# Patient Record
Sex: Female | Born: 1955 | Race: Black or African American | Hispanic: No | Marital: Married | Smoking: Never smoker
Health system: Southern US, Community
[De-identification: ages and names within clinical notes are randomized; demographics above are authoritative.]

## PROBLEM LIST (undated history)

## (undated) DIAGNOSIS — Z923 Personal history of irradiation: Secondary | ICD-10-CM

## (undated) DIAGNOSIS — D571 Sickle-cell disease without crisis: Secondary | ICD-10-CM

## (undated) DIAGNOSIS — R0600 Dyspnea, unspecified: Secondary | ICD-10-CM

## (undated) DIAGNOSIS — F411 Generalized anxiety disorder: Secondary | ICD-10-CM

## (undated) DIAGNOSIS — Z170 Estrogen receptor positive status [ER+]: Principal | ICD-10-CM

## (undated) DIAGNOSIS — I10 Essential (primary) hypertension: Secondary | ICD-10-CM

## (undated) DIAGNOSIS — C50412 Malignant neoplasm of upper-outer quadrant of left female breast: Principal | ICD-10-CM

## (undated) DIAGNOSIS — R7303 Prediabetes: Secondary | ICD-10-CM

## (undated) DIAGNOSIS — E785 Hyperlipidemia, unspecified: Secondary | ICD-10-CM

## (undated) DIAGNOSIS — E669 Obesity, unspecified: Secondary | ICD-10-CM

## (undated) DIAGNOSIS — M1990 Unspecified osteoarthritis, unspecified site: Secondary | ICD-10-CM

## (undated) DIAGNOSIS — Z9221 Personal history of antineoplastic chemotherapy: Secondary | ICD-10-CM

## (undated) DIAGNOSIS — R0609 Other forms of dyspnea: Secondary | ICD-10-CM

## (undated) DIAGNOSIS — G709 Myoneural disorder, unspecified: Secondary | ICD-10-CM

## (undated) HISTORY — DX: Sickle-cell disease without crisis: D57.1

## (undated) HISTORY — DX: Hyperlipidemia, unspecified: E78.5

## (undated) HISTORY — DX: Obesity, unspecified: E66.9

## (undated) HISTORY — DX: Other forms of dyspnea: R06.09

## (undated) HISTORY — DX: Estrogen receptor positive status (ER+): Z17.0

## (undated) HISTORY — DX: Essential (primary) hypertension: I10

## (undated) HISTORY — DX: Malignant neoplasm of upper-outer quadrant of left female breast: C50.412

## (undated) HISTORY — DX: Dyspnea, unspecified: R06.00

## (undated) HISTORY — PX: TUBAL LIGATION: SHX77

## (undated) HISTORY — DX: Generalized anxiety disorder: F41.1

## (undated) HISTORY — DX: Myoneural disorder, unspecified: G70.9

---

## 1998-04-29 ENCOUNTER — Other Ambulatory Visit: Admission: RE | Admit: 1998-04-29 | Discharge: 1998-04-29 | Payer: Self-pay

## 2004-12-16 ENCOUNTER — Ambulatory Visit: Payer: Self-pay | Admitting: Family Medicine

## 2005-06-21 ENCOUNTER — Ambulatory Visit: Payer: Self-pay | Admitting: Family Medicine

## 2005-07-12 ENCOUNTER — Ambulatory Visit: Payer: Self-pay | Admitting: Family Medicine

## 2006-01-06 ENCOUNTER — Encounter: Admission: RE | Admit: 2006-01-06 | Discharge: 2006-01-06 | Payer: Self-pay | Admitting: *Deleted

## 2006-01-20 ENCOUNTER — Encounter: Admission: RE | Admit: 2006-01-20 | Discharge: 2006-01-20 | Payer: Self-pay | Admitting: *Deleted

## 2006-05-31 ENCOUNTER — Telehealth: Payer: Self-pay | Admitting: *Deleted

## 2006-05-31 ENCOUNTER — Ambulatory Visit: Payer: Self-pay | Admitting: Sports Medicine

## 2006-06-01 ENCOUNTER — Ambulatory Visit: Payer: Self-pay | Admitting: Family Medicine

## 2006-06-01 ENCOUNTER — Encounter (INDEPENDENT_AMBULATORY_CARE_PROVIDER_SITE_OTHER): Payer: Self-pay | Admitting: Family Medicine

## 2006-06-01 ENCOUNTER — Ambulatory Visit (HOSPITAL_COMMUNITY): Admission: RE | Admit: 2006-06-01 | Discharge: 2006-06-01 | Payer: Self-pay | Admitting: Family Medicine

## 2006-06-01 DIAGNOSIS — I10 Essential (primary) hypertension: Secondary | ICD-10-CM | POA: Insufficient documentation

## 2006-06-01 LAB — CONVERTED CEMR LAB
Blood in Urine, dipstick: NEGATIVE
CO2: 24 meq/L (ref 19–32)
Chloride: 99 meq/L (ref 96–112)
Glucose, Urine, Semiquant: NEGATIVE
Ketones, urine, test strip: NEGATIVE
Potassium: 4.3 meq/L (ref 3.5–5.3)
Protein, U semiquant: NEGATIVE
Sodium: 142 meq/L (ref 135–145)
Specific Gravity, Urine: 1.01
pH: 6.5

## 2006-06-02 ENCOUNTER — Telehealth: Payer: Self-pay | Admitting: *Deleted

## 2006-06-06 ENCOUNTER — Encounter (INDEPENDENT_AMBULATORY_CARE_PROVIDER_SITE_OTHER): Payer: Self-pay | Admitting: Family Medicine

## 2006-06-29 ENCOUNTER — Encounter: Admission: RE | Admit: 2006-06-29 | Discharge: 2006-06-29 | Payer: Self-pay | Admitting: General Practice

## 2006-08-08 ENCOUNTER — Encounter (INDEPENDENT_AMBULATORY_CARE_PROVIDER_SITE_OTHER): Payer: Self-pay | Admitting: Family Medicine

## 2006-08-08 ENCOUNTER — Encounter: Admission: RE | Admit: 2006-08-08 | Discharge: 2006-08-08 | Payer: Self-pay | Admitting: Sports Medicine

## 2006-09-20 ENCOUNTER — Telehealth (INDEPENDENT_AMBULATORY_CARE_PROVIDER_SITE_OTHER): Payer: Self-pay | Admitting: Family Medicine

## 2007-02-15 ENCOUNTER — Ambulatory Visit: Payer: Self-pay | Admitting: Family Medicine

## 2007-03-01 ENCOUNTER — Encounter: Admission: RE | Admit: 2007-03-01 | Discharge: 2007-03-01 | Payer: Self-pay | Admitting: Family Medicine

## 2007-03-13 ENCOUNTER — Telehealth (INDEPENDENT_AMBULATORY_CARE_PROVIDER_SITE_OTHER): Payer: Self-pay | Admitting: Family Medicine

## 2007-03-19 ENCOUNTER — Telehealth (INDEPENDENT_AMBULATORY_CARE_PROVIDER_SITE_OTHER): Payer: Self-pay | Admitting: Family Medicine

## 2007-04-17 ENCOUNTER — Telehealth (INDEPENDENT_AMBULATORY_CARE_PROVIDER_SITE_OTHER): Payer: Self-pay | Admitting: Family Medicine

## 2007-04-25 ENCOUNTER — Ambulatory Visit: Payer: Self-pay | Admitting: Family Medicine

## 2007-04-25 ENCOUNTER — Encounter (INDEPENDENT_AMBULATORY_CARE_PROVIDER_SITE_OTHER): Payer: Self-pay | Admitting: Family Medicine

## 2007-04-25 LAB — CONVERTED CEMR LAB
BUN: 18 mg/dL (ref 6–23)
CO2: 28 meq/L (ref 19–32)
Chloride: 97 meq/L (ref 96–112)
Glucose, Bld: 90 mg/dL (ref 70–99)
Potassium: 3.6 meq/L (ref 3.5–5.3)
Sodium: 137 meq/L (ref 135–145)

## 2007-05-15 ENCOUNTER — Telehealth (INDEPENDENT_AMBULATORY_CARE_PROVIDER_SITE_OTHER): Payer: Self-pay | Admitting: Family Medicine

## 2007-10-22 ENCOUNTER — Telehealth: Payer: Self-pay | Admitting: *Deleted

## 2007-12-07 ENCOUNTER — Telehealth (INDEPENDENT_AMBULATORY_CARE_PROVIDER_SITE_OTHER): Payer: Self-pay | Admitting: *Deleted

## 2008-02-21 ENCOUNTER — Ambulatory Visit: Payer: Self-pay | Admitting: Family Medicine

## 2008-02-25 ENCOUNTER — Encounter: Payer: Self-pay | Admitting: Family Medicine

## 2008-02-25 ENCOUNTER — Ambulatory Visit: Payer: Self-pay | Admitting: Family Medicine

## 2008-02-27 ENCOUNTER — Encounter: Payer: Self-pay | Admitting: Family Medicine

## 2008-02-27 ENCOUNTER — Ambulatory Visit: Payer: Self-pay | Admitting: Family Medicine

## 2008-02-27 LAB — CONVERTED CEMR LAB
Cholesterol: 223 mg/dL — ABNORMAL HIGH (ref 0–200)
LDL Cholesterol: 146 mg/dL — ABNORMAL HIGH (ref 0–99)
Total CHOL/HDL Ratio: 4.3
VLDL: 25 mg/dL (ref 0–40)

## 2009-02-10 ENCOUNTER — Encounter: Payer: Self-pay | Admitting: Family Medicine

## 2009-02-10 ENCOUNTER — Ambulatory Visit: Payer: Self-pay | Admitting: Family Medicine

## 2009-02-10 DIAGNOSIS — E669 Obesity, unspecified: Secondary | ICD-10-CM | POA: Insufficient documentation

## 2009-02-10 DIAGNOSIS — E785 Hyperlipidemia, unspecified: Secondary | ICD-10-CM | POA: Insufficient documentation

## 2009-02-10 DIAGNOSIS — F411 Generalized anxiety disorder: Secondary | ICD-10-CM | POA: Insufficient documentation

## 2009-02-11 ENCOUNTER — Telehealth: Payer: Self-pay | Admitting: Family Medicine

## 2009-02-11 LAB — CONVERTED CEMR LAB
ALT: 14 units/L (ref 0–35)
AST: 16 units/L (ref 0–37)
Albumin: 4.3 g/dL (ref 3.5–5.2)
Alkaline Phosphatase: 62 units/L (ref 39–117)
BUN: 12 mg/dL (ref 6–23)
HDL: 47 mg/dL (ref >39)
LDL Cholesterol: 159 mg/dL — ABNORMAL HIGH (ref 0–99)
Potassium: 4 meq/L (ref 3.5–5.3)
Sodium: 143 meq/L (ref 135–145)

## 2009-03-24 ENCOUNTER — Telehealth: Payer: Self-pay | Admitting: Family Medicine

## 2009-04-03 ENCOUNTER — Telehealth: Payer: Self-pay | Admitting: Family Medicine

## 2009-04-08 ENCOUNTER — Ambulatory Visit: Payer: Self-pay | Admitting: Family Medicine

## 2009-04-08 DIAGNOSIS — S0003XA Contusion of scalp, initial encounter: Secondary | ICD-10-CM | POA: Insufficient documentation

## 2009-04-08 DIAGNOSIS — S1093XA Contusion of unspecified part of neck, initial encounter: Secondary | ICD-10-CM

## 2009-04-08 DIAGNOSIS — S0083XA Contusion of other part of head, initial encounter: Secondary | ICD-10-CM

## 2009-05-26 ENCOUNTER — Ambulatory Visit: Payer: Self-pay | Admitting: Family Medicine

## 2009-05-26 ENCOUNTER — Encounter: Payer: Self-pay | Admitting: Family Medicine

## 2009-05-26 DIAGNOSIS — R42 Dizziness and giddiness: Secondary | ICD-10-CM

## 2009-05-26 DIAGNOSIS — R000 Tachycardia, unspecified: Secondary | ICD-10-CM | POA: Insufficient documentation

## 2009-05-27 ENCOUNTER — Ambulatory Visit (HOSPITAL_COMMUNITY): Admission: RE | Admit: 2009-05-27 | Discharge: 2009-05-27 | Payer: Self-pay | Admitting: Family Medicine

## 2009-05-27 ENCOUNTER — Telehealth: Payer: Self-pay | Admitting: Family Medicine

## 2009-05-27 LAB — CONVERTED CEMR LAB: Direct LDL: 88 mg/dL

## 2009-05-29 ENCOUNTER — Encounter: Payer: Self-pay | Admitting: Family Medicine

## 2009-05-29 LAB — CONVERTED CEMR LAB: Pap Smear: NEGATIVE

## 2009-06-29 ENCOUNTER — Telehealth (INDEPENDENT_AMBULATORY_CARE_PROVIDER_SITE_OTHER): Payer: Self-pay | Admitting: *Deleted

## 2009-07-17 ENCOUNTER — Encounter: Payer: Self-pay | Admitting: Family Medicine

## 2009-07-27 ENCOUNTER — Telehealth: Payer: Self-pay | Admitting: Family Medicine

## 2010-02-11 ENCOUNTER — Telehealth (INDEPENDENT_AMBULATORY_CARE_PROVIDER_SITE_OTHER): Payer: Self-pay | Admitting: *Deleted

## 2010-03-02 ENCOUNTER — Ambulatory Visit: Payer: Self-pay | Admitting: Family Medicine

## 2010-03-02 ENCOUNTER — Encounter: Payer: Self-pay | Admitting: Family Medicine

## 2010-03-02 LAB — CONVERTED CEMR LAB
BUN: 12 mg/dL (ref 6–23)
CO2: 27 meq/L (ref 19–32)
Calcium: 9.3 mg/dL (ref 8.4–10.5)
Chloride: 100 meq/L (ref 96–112)
Cholesterol: 164 mg/dL (ref 0–200)
Creatinine, Ser: 0.77 mg/dL (ref 0.40–1.20)
HDL: 47 mg/dL (ref >39)
LDL Goal: 130 mg/dL
Total CHOL/HDL Ratio: 3.5

## 2010-03-16 ENCOUNTER — Telehealth: Payer: Self-pay | Admitting: Family Medicine

## 2010-04-13 NOTE — Consult Note (Signed)
Summary: [facility]  [facility]   Imported By: Clydell Hakim 07/22/2009 11:20:34  _____________________________________________________________________  External Attachment:    Type:   Image     Comment:   External Document  Appended Document: [facility] Reviewed

## 2010-04-13 NOTE — Progress Notes (Signed)
Summary: Test Res  Phone Note Call from Patient Call back at Home Phone [phone]   Caller: Patient Summary of Call: Pt checking on status of CT that she had today? Initial call taken by: Clydell Hakim,  May 27, 2009 3:02 PM  Follow-up for Phone Call        will forward to MD. Follow-up by: Theresia Lo RN,  May 27, 2009 3:45 PM  Additional Follow-up for Phone Call Additional follow up Details #1::        I have attempted to call the patient multiple times at the number provided but no answer.  Will send copy of results. Additional Follow-up by: Marisue Ivan  MD,  May 29, 2009 9:17 AM

## 2010-04-13 NOTE — Progress Notes (Signed)
Summary: phn msg  Phone Note Call from Patient Call back at [facility] Phone [phone]   Caller: Patient Summary of Call: Pt fell last week and hit her head and wondering if she should she the doctor.  She wanted to talk to the Dr. not the nurse. Initial call taken by: Clydell Hakim,  April 03, 2009 2:20 PM  Follow-up for Phone Call        forwarded to PCP Follow-up by: Gladstone Pih,  April 06, 2009 9:44 AM

## 2010-04-13 NOTE — Progress Notes (Signed)
Summary: referral  Phone Note Call from Patient Call back at Home Phone [phone] Call back at [phone]   Caller: Patient Summary of Call: still having problems w/ dizzyness/ear/headache wants to be referral to ENT Initial call taken by: De Nurse,  June 29, 2009 2:48 PM  Follow-up for Phone Call        will forward message to MD. Follow-up by: Theresia Lo RN,  June 29, 2009 2:54 PM  Additional Follow-up for Phone Call Additional follow up Details #1::        pt was instructed to f/u in 2 weeks after office visit to reassess and start of meclizine.  will advise for office visit to reevaluate and determine if referral is deem medically necessary Additional Follow-up by: Marisue Ivan  MD,  June 29, 2009 10:03 PM    Additional Follow-up for Phone Call Additional follow up Details #2::    patient notified and appointment scheduled 07/02/2009 with Dr. Burnadette Pop. Follow-up by: Theresia Lo RN,  June 30, 2009 9:53 AM

## 2010-04-13 NOTE — Progress Notes (Signed)
Summary: triage  Phone Note Call from Patient Call back at Home Phone [phone]   Caller: Patient Summary of Call: Larey Seat yesterday and hit head. Feels o.k.  Dull ache.  Wondering if she should she someone. Initial call taken by: Clydell Hakim,  March 24, 2009 3:12 PM  Follow-up for Phone Call        hit head yesterday. slipped on wet floor in kitchen. denies sleepiness or confusion. has a "little, dull headache" pain is 2/10.  no LOC. denies lumps or bumps or confusion. able to speak normally & move all extremities.  feels fine. advised tylenol or ibu for the HA. offered appt. states she will take the tylenol or ibu & call tomorrow if she wants to be seen told her to go to ED if pain increased, vomiting, confusion, motor changes or otherwise not feeling right.  she agreed with plan Follow-up by: Golden Circle RN,  March 24, 2009 3:14 PM  Additional Follow-up for Phone Call Additional follow up Details #1::        agree with plan Additional Follow-up by: Marisue Ivan  MD,  March 24, 2009 5:14 PM

## 2010-04-13 NOTE — Assessment & Plan Note (Signed)
Summary: head contusion   Vital Signs:  Patient profile:   55 year old female Height:      63.5 inches Weight:      212.9 pounds BMI:     37.26 Temp:     99.1 degrees F Pulse rate:   103 / minute BP sitting:   153 / 91  (left arm) Cuff size:   regular  Vitals Entered By: Gladstone Pih (April 08, 2009 11:12 AM)  CC: Fell and hit head on Kitchen floor  55 years age Is Patient Diabetic? No Pain Assessment Patient in pain? yes     Location: head Intensity: 7 Type: sharp   Primary Care Provider:  Marisue Ivan  MD  CC:  Larey Seat and hit head on Kitchen floor  2 weeks age.  History of Present Illness: 55yo F here to f/u s/p head trauma  Head trauma: Fell on hardwood floors 2 weeks ago and hit her head.  At that time of the incident, she denies any LOC, bleeding, laceration, N/V, syncopal events, or vision changes.  Pain was a dull ache.  States that she continues to have pain on the top of her head.  No bump, change in mental status, alteration of vision, N/V, or difficulty concentrating.  No neck pain or back pain.  No focal weakness or numbness.  Habits & Providers  Alcohol-Tobacco-Diet     Tobacco Status: never  Allergies: No Known Drug Allergies  Review of Systems       No bump, change in mental status, alteration of vision, N/V, or difficulty concentrating.  No neck pain or back pain.  No focal weakness or numbness.  Physical Exam  General:  VS Reviewed. Well appearing, NAD.  Head:  atraumatic.   no laceration, bump, or bruise visible Eyes:  EOMI.  PERRLA.  Red Reflex- present and symmetric intensity  symmetric light reflex  Ears:  External ear exam shows no significant lesions or deformities.  Otoscopic examination reveals clear canals, tympanic membranes are intact bilaterally without bulging, retraction, inflammation or discharge. Hearing is grossly normal bilaterally. Nose:  External nasal examination shows no deformity or inflammation.  Mouth:  Oral  mucosa and oropharynx without lesions or exudates.  Teeth in good repair. Neck:  supple, full ROM, no goiter or mass  Lungs:  Normal respiratory effort, chest expands symmetrically. Lungs are clear to auscultation, no crackles or wheezes. Heart:  Normal rate and regular rhythm. S1 and S2 normal without gallop, murmur, click, rub or other extra sounds. Msk:  moves all ext without difficulty Neurologic:  sensation grossly intact alert & oriented X3, cranial nerves II-XII intact, strength normal in all extremities, gait normal, and DTRs symmetrical and normal.     Impression & Recommendations:  Problem # 1:  CONTUSION, HEAD (ICD-920) Assessment New Suspect deep contusion but no visible signs of neurological deficits.  I think a CT scan or MRI would be of little yield according to her exam and history.  I have instructed her to treat her acute pain with scheduled tylenol for the next week.  If she has any neurological deficits, she is to let me know right away.    Orders: [facility]- Est Level  3 ([address])  Complete Medication List: 1)  Lisinopril-hydrochlorothiazide 20-12.5 Mg Tabs (Lisinopril-hydrochlorothiazide) .... One tablet daily for blood pressure control 2)  Pravastatin Sodium 40 Mg Tabs (Pravastatin sodium) .... One by mouth at bedtime  Patient Instructions: 1)  Take the tylenol 2 extra strength (500mg ) every 8 hours  for the next week for the headache. 2)  If the pain is not improved in 1 week, call me and we'll reassess.

## 2010-04-13 NOTE — Assessment & Plan Note (Signed)
Summary: worsened dizziness and vertigo s/p fall- pap smear today   Vital Signs:  Patient profile:   55 year old female Height:      63.5 inches Weight:      210.6 pounds BMI:     36.85 Temp:     98.4 degrees F oral Pulse rate:   88 / minute BP sitting:   144 / 88  (left arm) Cuff size:   regular  Vitals Entered By: Gladstone Pih (May 26, 2009 8:49 AM)  Serial Vital Signs/Assessments:  Time      Position  BP       Pulse  Resp  Temp     By                     138/78                         Marisue Ivan  MD  Comments: Right arm, sitting, manual By: Marisue Ivan  MD   CC: worsening dizziness  Is Patient Diabetic? No Pain Assessment Patient in pain? no        Primary Care Provider:  Marisue Ivan  MD  CC:  worsening dizziness .  History of Present Illness: 55yo F c/o worsening dizziness and HA and here for pap smear  Dizziness: Reports unsteadiness and vertigo.  States that it was present prior to her fall but worsened after the fall.  She did not report this when I saw here on 04/08/2009.  Worsened by riding in a car and sometimes transitioning from sitting to standing.  Denies any vision changes,  N/V, or amnesia.  Still has pain on the top of her head.    HTN: No adverse effects from medication.  Not checking it regularly.  Was well controlled at last visit.  No  CP, palpitations, or swelling.   Preventative: Here for a pap smear today.  Habits & Providers  Alcohol-Tobacco-Diet     Tobacco Status: never  Current Medications (verified): 1)  Lisinopril-Hydrochlorothiazide 20-12.5 Mg  Tabs (Lisinopril-Hydrochlorothiazide) .... One Tablet Daily For Blood Pressure Control 2)  Pravastatin Sodium 40 Mg  Tabs (Pravastatin Sodium) .... One By Mouth At Bedtime  Allergies (verified): No Known Drug Allergies  Physical Exam  General:  VS Reviewed and rechecked. Obese, well appearing, NAD.  Head:  atraumatic.   no laceration, bump, or bruise  visible Eyes:  EOMI PERRLA vision grossly intact Mouth:  Oral mucosa and oropharynx without lesions or exudates.  Teeth in good repair. Neck:  supple, full ROM, no goiter or mass  Lungs:  Normal respiratory effort, chest expands symmetrically. Lungs are clear to auscultation, no crackles or wheezes. Heart:  Normal rate and regular rhythm. S1 and S2 normal without gallop, murmur, click, rub or other extra sounds. Genitalia:  Pelvic Exam:        External: normal female genitalia without lesions or masses        Vagina: normal without lesions or masses        Cervix: normal without lesions or masses        Adnexa: normal bimanual exam without masses or fullness        Uterus: normal by palpation        Pap smear: performed Extremities:  no edema Neurologic:  sensation grossly intact alert & oriented X3, cranial nerves II-XII intact, strength normal in all extremities, gait normal, and DTRs symmetrical and normal.  Impression & Recommendations:  Problem # 1:  DIZZINESS (ICD-780.4) Assessment Deteriorated Dizziness and vertigo- change from prior exam in Jan. Concerning b/c she states it is worsened since the fall. No neurological deficits on exam.  CBG wnls.  Discussed with Dr. Jennette Kettle.  Plan to obtain head CT to r/o bleed. Will treat her acute vertigo symptoms w/ Meclizine.   She is to f/u in 2 weeks.  Her updated medication list for this problem includes:    Meclizine Hcl 25 Mg Tabs (Meclizine hcl) .Marland Kitchen... 1 tab by mouth three times a day as needed for dizziness  Orders: Glucose Cap-[facility] (60454) CT without Contrast (CT w/o contrast) [facility]- Est  Level 4 (09811)  Problem # 2:  HYPERTENSION, BENIGN ESSENTIAL (ICD-401.1) Assessment: Improved Improved and now at goal (<140/90). No changes to regimen. I don't think BP is contributing to vertigo and dizziness.    Her updated medication list for this problem includes:    Lisinopril-hydrochlorothiazide 20-12.5 Mg Tabs  (Lisinopril-hydrochlorothiazide) ..... One tablet daily for blood pressure control  Orders: [facility]- Est  Level 4 ([address])  Problem # 3:  HYPERLIPIDEMIA (ICD-272.4) Assessment: Unchanged Recheck LDL after starting the pravastatin.  Her updated medication list for this problem includes:    Pravastatin Sodium 40 Mg Tabs (Pravastatin sodium) ..... One by mouth at bedtime  Problem # 4:  Preventive Health Care (ICD-V70.0) Assessment: Comment Only Pap smear obtained today.  Complete Medication List: 1)  Lisinopril-hydrochlorothiazide 20-12.5 Mg Tabs (Lisinopril-hydrochlorothiazide) .... One tablet daily for blood pressure control 2)  Pravastatin Sodium 40 Mg Tabs (Pravastatin sodium) .... One by mouth at bedtime 3)  Meclizine Hcl 25 Mg Tabs (Meclizine hcl) .Marland Kitchen.. 1 tab by mouth three times a day as needed for dizziness  Other Orders: Pap Smear-[facility] ([id number]) T-Lipoprotein (LDL cholesterol)  ([id number])  Patient Instructions: 1)  Please schedule a follow-up appointment in 2 weeks to reassess dizziness. 2)  I gave you a new medication to help with the dizziness. 3)  We are obtaining a head CT today to evaluate your symptoms. Prescriptions: MECLIZINE HCL 25 MG TABS (MECLIZINE HCL) 1 tab by mouth three times a day as needed for dizziness  #30 x 0   Entered and Authorized by:   Marisue Ivan  MD   Signed by:   Marisue Ivan  MD on 05/26/2009   Method used:   [facility]        [facility]  [address] [phone]* (retail)       [address]       [place], [place]  [id number]       Ph: [phone] or [phone]       Fax: [phone]   RxID:   [id number]    Prevention & Chronic Care Immunizations   Influenza vaccine: Not documented   Influenza vaccine deferral: Refused  (02/10/2009)    Tetanus booster: 02/10/2009: Tdap    Pneumococcal vaccine: Not documented  Colorectal Screening   Hemoccult: Not documented   Hemoccult  action/deferral: Refused  (05/26/2009)    Colonoscopy: Not documented   Colonoscopy action/deferral: Deferred  (02/10/2009)  Other Screening   Pap smear: normal  (07/27/2006)   Pap smear due: 07/2009    Mammogram: Normal  (03/06/2007)   Mammogram action/deferral: Ordered  (02/10/2009)   Mammogram due: 03/2008   Smoking status: never  (05/26/2009)  Lipids   Total Cholesterol: 227  (02/10/2009)   Lipid panel action/deferral: LDL Direct Ordered   LDL:  159  (02/10/2009)   LDL Direct: Not documented   HDL: 47  (02/10/2009)   Triglycerides: 107  (02/10/2009)    SGOT (AST): 16  (02/10/2009)   SGPT (ALT): 14  (02/10/2009)   Alkaline phosphatase: 62  (02/10/2009)   Total bilirubin: 0.4  (02/10/2009)    Lipid flowsheet reviewed?: Yes   Progress toward LDL goal: Unchanged  Hypertension   Last Blood Pressure: 144 / 88  (05/26/2009)   Serum creatinine: 0.81  (02/10/2009)   Serum potassium 4.0  (02/10/2009)    Hypertension flowsheet reviewed?: Yes   Progress toward BP goal: At goal  Self-Management Support :   Personal Goals (by the next clinic visit) :      Personal blood pressure goal: 140/90  (02/10/2009)     Personal LDL goal: 130  (02/10/2009)    Patient will work on the following items until the next clinic visit to reach self-care goals:     Medications and monitoring: take my medicines every day, check my blood pressure, bring all of my medications to every visit  (05/26/2009)     Eating: drink diet soda or water instead of juice or soda, eat more vegetables, use fresh or frozen vegetables, eat foods that are low in salt, eat baked foods instead of fried foods, eat fruit for snacks and desserts, limit or avoid alcohol  (05/26/2009)     Activity: take a 30 minute walk every day  (02/10/2009)    Hypertension self-management support: BP self-monitoring log, Written self-care plan, Education handout  (05/26/2009)   Hypertension self-care plan printed.   Hypertension  education handout printed    Lipid self-management support: Written self-care plan, Education handout  (02/10/2009)

## 2010-04-13 NOTE — Progress Notes (Signed)
Summary: Lisinopril-HCTZ and Pravastatin refill 6 month supply  Phone Note Refill Request Call back at Home Phone [phone] Call back at Work Phone [phone] Message from:  Patient  Refills Requested: Medication #1:  LISINOPRIL-HYDROCHLOROTHIAZIDE 20-12.5 MG  TABS one tablet daily for blood pressure control  Medication #2:  PRAVASTATIN SODIUM 40 MG  TABS one by mouth at bedtime [facility] [address]  Initial call taken by: De Nurse,  Jul 27, 2009 4:32 PM    Prescriptions: PRAVASTATIN SODIUM 40 MG  TABS (PRAVASTATIN SODIUM) one by mouth at bedtime  #90 x 1   Entered and Authorized by:   Marisue Ivan  MD   Signed by:   Marisue Ivan  MD on 07/28/2009   Method used:   [facility]        [facility]  [address] [phone]* (retail)       [address]       [place], [place]  [address]       Ph: [phone] or [phone]       Fax: [phone]   RxID:   [id number] LISINOPRIL-HYDROCHLOROTHIAZIDE 20-12.5 MG  TABS (LISINOPRIL-HYDROCHLOROTHIAZIDE) one tablet daily for blood pressure control  #90 x 1   Entered and Authorized by:   Marisue Ivan  MD   Signed by:   Marisue Ivan  MD on 07/28/2009   Method used:   [facility]        [facility]  [address] [phone]* (retail)       [address]       [place], [place]  [address]       Ph: [phone] or [phone]       Fax: [phone]   RxID:   [id number]

## 2010-04-13 NOTE — Letter (Signed)
Summary: Generic Letter  [facility]  [address]   [place], [place] [address]   Phone: [phone]  Fax: [phone]    05/29/2009  Tanya [name] [address] [place], [place]  [address]  Dear Ms. [name],  The results of your head CT were negative.  No signs of bleed or abnormalities to explain your symptoms.  I want you to see me in the office if your symptoms do not resolve.  Sincerely,   Marisue Ivan  MD  Appended Document: Generic Letter mailed

## 2010-04-15 NOTE — Assessment & Plan Note (Signed)
Summary: f/u meds,df   Vital Signs:  Patient profile:   55 year old female Weight:      210 pounds BMI:     36.75 Temp:     98.6 degrees F oral Pulse rate:   84 / minute Pulse rhythm:   regular BP sitting:   138 / 91  (left arm) Cuff size:   regular  Vitals Entered By: Loralee Pacas CMA (March 02, 2010 2:38 PM) CC: Hypertension Management, Lipid Management Comments pt has not eaten or drank anything all day b/c she would like to have labs drawn   Primary Nilay Mangrum:  Tanya [name]  CC:  Hypertension Management and Lipid Management.  History of Present Illness: 55 year old female who presents today for medication follow up.  1) Hypertension: pt blood pressure monitored at home.  Pt does not report any concerning high or low readings.  Some reports of "lightheadedness" that is more consistent with anxiety than with orthostasis (see below).  2) Hyperlipidemia: Pt does not report any proximal muscle weakness/aches.  Last FLP was obtained a year ago.  3) Light feeling: Pt reports that, for years, she has had problems while driving.  She reports that her "nerves won't let her keep going," and has possibly some lightheadedness associated with this.  She does not report feelings of impending doom but does worry about safety while driving.  This has been worse since her fall in March.  She also reports that she has problems with feeling anxious about many little things and this keeps her on edge a lot of the time.  Her problems with driving are significantly affecting her life, as she is unable to get out to do anything, and she feels they may well be causing some depressed mood.  Hypertension History:      She denies headache, chest pain, palpitations, dyspnea with exertion, orthopnea, PND, peripheral edema, and neurologic problems.        Positive major cardiovascular risk factors include hyperlipidemia, hypertension, and family history for ischemic heart disease (females less than 18  years old).  Negative major cardiovascular risk factors include female age less than 92 years old, no history of diabetes, and non-tobacco-user status.        Further assessment for target organ damage reveals no history of ASHD, cardiac end-organ damage (CHF/LVH), stroke/TIA, peripheral vascular disease, renal insufficiency, or hypertensive retinopathy.    Lipid Management History:      Positive NCEP/ATP III risk factors include family history for ischemic heart disease (females less than 33 years old) and hypertension.  Negative NCEP/ATP III risk factors include female age less than 80 years old, non-diabetic, non-tobacco-user status, no ASHD (atherosclerotic heart disease), no prior stroke/TIA, and no peripheral vascular disease.      Allergies: No Known Drug Allergies  Past History:  Past medical, surgical, family and social histories (including risk factors) reviewed for relevance to current acute and chronic problems.  Past Medical History: Reviewed history from 02/21/2008 and no changes required. Remote cardiavc evaluation, poss Echo  (12 years ago?) at Dr Gambles' s office but [place] of Physician name. Hypertension  Past Surgical History: Reviewed history from 05/11/2006 and no changes required. c-section - 03/14/1982, c-section - 03/14/1984, Tubal ligation - 03/14/1990  Family History: Reviewed history from 02/21/2008 and no changes required. Family History Diabetes 1st degree relative Family History Hypertension Family History of Stroke F 1st degree relative <60 Father deceased prostate cancer  Mother is living with htn, DM, and stroke  Social History: Reviewed history from 02/21/2008 and no changes required. lives w/husband, and 1 son 70 and 1 daughter 23; [employer] cabin [employer]; non-smoker Drug use-no Alcohol use-no Regular exercise-no  Review of Systems  The patient denies anorexia, fever, weight loss, weight gain, vision loss, decreased hearing, chest pain, dyspnea on  exertion, and peripheral edema.    Physical Exam  General:  VS Reviewed and rechecked. Obese, well appearing, NAD.  Head:  Normocephalic and atraumatic without obvious abnormalities. No apparent alopecia or balding. Neck:  supple, full ROM, no goiter or mass  Lungs:  Normal respiratory effort, chest expands symmetrically. Lungs are clear to auscultation, no crackles or wheezes. Heart:  Normal rate and regular rhythm. S1 and S2 normal without gallop, murmur, click, rub or other extra sounds. Abdomen:  Bowel sounds positive,abdomen soft and non-tender without masses, organomegaly or hernias noted. Msk:  moves all ext without difficulty Extremities:  no edema   Impression & Recommendations:  Problem # 1:  HYPERLIPIDEMIA (ICD-272.4) Will obtain a FLP at this time.  Will adjust medication as necessary.  Her updated medication list for this problem includes:    Pravastatin Sodium 40 Mg Tabs (Pravastatin sodium) ..... One by mouth at bedtime  Orders: Comp Met-[facility] [phone]) Lipid-[facility] ([id number]) [facility]- Est  Level 4 (30865)  Problem # 2:  HYPERTENSION, BENIGN ESSENTIAL (ICD-401.1) BP slightly above goal today.  Will have the patient monitor at home and bring a log with her when she comes for followup.  Want to make certain that the patient is not having any problems with hypotension when she is at home.  We want to avoid anything that might contribute to her "lightheadedness" feeling.  Her updated medication list for this problem includes:    Lisinopril-hydrochlorothiazide 20-12.5 Mg Tabs (Lisinopril-hydrochlorothiazide) ..... One tablet daily for blood pressure control  Orders: Comp Met-[facility] ([id number]) [facility]- Est  Level 4 (84132)  Problem # 3:  ANXIETY (ICD-300.00) Pt's symptoms do sound somewhat like either a generalized anxiety disorder or, perhaps, a more specific phobia.  It is slightly hard to obtain a clear history.  Will give a trial of an SSRI and re-evaluate in 6 weeks  for any effect.  In the past the patient was referred to Dr. Pascal Lux for possible counciling but it does not appear that the patient followed through on this.  Will consider repeating this if her problems persist.  Her updated medication list for this problem includes:    Celexa 20 Mg Tabs (Citalopram hydrobromide) .Marland Kitchen... Take one daily for anxiety.  Orders: TSH-[facility] ([id number]) [facility]- Est  Level 4 (66440)  Complete Medication List: 1)  Lisinopril-hydrochlorothiazide 20-12.5 Mg Tabs (Lisinopril-hydrochlorothiazide) .... One tablet daily for blood pressure control 2)  Pravastatin Sodium 40 Mg Tabs (Pravastatin sodium) .... One by mouth at bedtime 3)  Meclizine Hcl 25 Mg Tabs (Meclizine hcl) .Marland Kitchen.. 1 tab by mouth three times a day as needed for dizziness 4)  Celexa 20 Mg Tabs (Citalopram hydrobromide) .... Take one daily for anxiety.  Hypertension Assessment/Plan:      The patient's hypertensive risk group is category B: At least one risk factor (excluding diabetes) with no target organ damage.  Her calculated 10 year risk of coronary heart disease is 11 %.  Today's blood pressure is 138/91.  Her blood pressure goal is < 140/90.  Lipid Assessment/Plan:      Based on NCEP/ATP III, the patient's risk factor category is "2 or more risk factors and a calculated 10 year CAD risk of <  20%".  The patient's lipid goals are as follows: Total cholesterol goal is 200; LDL cholesterol goal is 130; HDL cholesterol goal is 40; Triglyceride goal is 150.  Her LDL cholesterol goal has not been met.    Patient Instructions: 1)  It was great to see you today. 2)  We will get your blood work today. 3)  Please go over to talk with the people from [facility] about colon cancer screening. 4)  We will start you on Celexa and see if this helps with your anxiety.  I would also like you to get a blood pressure meter and check your blood pressure a couple time during the week at home. 5)  Come back to see me in about 6  weeks. Prescriptions: CELEXA 20 MG TABS (CITALOPRAM HYDROBROMIDE) Take one daily for anxiety.  #30 x 1   Entered and Authorized by:   Tanya [name]   Signed by:   Tanya [name] on 03/02/2010   Method used:   [facility]        [facility]  [address] [phone]* (retail)       [address]       [place], [place]  [id number]       Ph: [phone] or [phone]       Fax: [phone]   RxID:   [id number]    Orders Added: 1)  Comp Met-[facility] [[id number]] 2)  Lipid-[facility] [[id number]] 3)  TSH-[facility] [[id number]] 4)  [facility]- Est  Level 4 [16606]     Prevention & Chronic Care Immunizations   Influenza vaccine: Not documented   Influenza vaccine deferral: Refused  (02/10/2009)    Tetanus booster: 02/10/2009: Tdap    Pneumococcal vaccine: Not documented  Colorectal Screening   Hemoccult: Not documented   Hemoccult action/deferral: Refused  (05/26/2009)    Colonoscopy: Not documented   Colonoscopy action/deferral: Deferred  (02/10/2009)  Other Screening   Pap smear: NEGATIVE FOR INTRAEPITHELIAL LESIONS OR MALIGNANCY.  (05/26/2009)   Pap smear due: 07/2009    Mammogram: Normal  (03/06/2007)   Mammogram action/deferral: Ordered  (02/10/2009)   Mammogram due: 03/2008   Smoking status: never  (05/26/2009)  Lipids   Total Cholesterol: 227  (02/10/2009)   Lipid panel action/deferral: LDL Direct Ordered   LDL: 301  (02/10/2009)   LDL Direct: 88  (05/26/2009)   HDL: 47  (02/10/2009)   Triglycerides: 107  (02/10/2009)    SGOT (AST): 16  (02/10/2009)   SGPT (ALT): 14  (02/10/2009) CMP ordered    Alkaline phosphatase: 62  (02/10/2009)   Total bilirubin: 0.4  (02/10/2009)  Hypertension   Last Blood Pressure: 138 / 91  (03/02/2010)   Serum creatinine: 0.81  (02/10/2009)   Serum potassium 4.0  (02/10/2009) CMP ordered   Self-Management Support :   Personal Goals (by the next clinic visit) :      Personal blood pressure goal: 140/90   (02/10/2009)     Personal LDL goal: 130  (02/10/2009)    Hypertension self-management support: BP self-monitoring log, Written self-care plan, Education handout  (05/26/2009)    Lipid self-management support: Written self-care plan, Education handout  (02/10/2009)

## 2010-04-15 NOTE — Progress Notes (Signed)
Summary: lab results  Phone Note Call from Patient Call back at Work Phone [phone]   Reason for Call: Lab or Test Results Summary of Call: pt req lab results Initial call taken by: Knox Royalty,  March 16, 2010 12:30 PM  Follow-up for Phone Call        Spoke with patient and informed her that her cholesterol was at goal and her blood sugar was not elevated.  Phone Call Completed Follow-up by: Majel Homer MD,  March 23, 2010 9:34 AM

## 2010-04-15 NOTE — Progress Notes (Signed)
Summary: refill  Phone Note Refill Request Call back at Work Phone [phone] Message from:  Patient  Refills Requested: Medication #1:  LISINOPRIL-HYDROCHLOROTHIAZIDE 20-12.5 MG  TABS one tablet daily for blood pressure control  Medication #2:  PRAVASTATIN SODIUM 40 MG  TABS one by mouth at bedtime pt has appt on 12/20 - would like 3 mo called in b/c of cost - too expensive to get one month at a time.  Initial call taken by: De Nurse,  February 11, 2010 11:47 AM Caller: Patient Summary of [facility] [address]  Follow-up for Phone Call        RX was sent in for 30 day supply on 02/02/2010 and pharmacy does have this RX.  she has not picked up yet  patient is wanting a 3 month supply due to cost , it is less expensive when she gets a three month supply. advised will ask MD and call her back. [facility] [address] Follow-up by: Theresia Lo RN,  February 16, 2010 9:44 AM  Additional Follow-up for Phone Call Additional follow up Details #1::        Attempted to contact the patient and was unsuccessful at that number.  I am fine with sending in a three month supply with no refills.  The patient is still to keep her appointment with me so we can continue to provide refills beyond this one. Additional Follow-up by: Majel Homer MD,  February 16, 2010 1:51 PM    Additional Follow-up for Phone Call Additional follow up Details #2::    patient notified. attempted calling pharmacy to update refill for 90 day supply but pharmacist states rx has already been updated to state 90 day supply for both meds. Follow-up by: Theresia Lo RN,  February 18, 2010 12:35 PM

## 2010-04-26 ENCOUNTER — Encounter: Payer: Self-pay | Admitting: *Deleted

## 2010-05-06 ENCOUNTER — Other Ambulatory Visit: Payer: Self-pay | Admitting: Family Medicine

## 2010-05-06 MED ORDER — CITALOPRAM HYDROBROMIDE 20 MG PO TABS: 20.0000 mg | ORAL_TABLET | Freq: Every day | ORAL | Status: DC

## 2010-07-13 ENCOUNTER — Other Ambulatory Visit: Payer: Self-pay | Admitting: Family Medicine

## 2010-07-13 MED ORDER — CITALOPRAM HYDROBROMIDE 20 MG PO TABS: 20.0000 mg | ORAL_TABLET | Freq: Every day | ORAL | Status: DC

## 2010-07-13 NOTE — Telephone Encounter (Signed)
Pt will require an appointment for any additional refills.

## 2010-07-16 ENCOUNTER — Telehealth: Payer: Self-pay | Admitting: Family Medicine

## 2010-07-16 NOTE — Telephone Encounter (Signed)
rx called to pharmacy for celexa. Attempted calling patient back but phone number listed is out of service.

## 2010-07-16 NOTE — Telephone Encounter (Signed)
Pt called checking status of refill for citalopram. According to med list this was sent by fax on 5/1. Found the fax request from the pharmacy  But it has written "already sent electronically today" but pharmacy never received response back. Please advise.

## 2010-07-20 ENCOUNTER — Other Ambulatory Visit: Payer: Self-pay | Admitting: Family Medicine

## 2010-07-20 MED ORDER — CITALOPRAM HYDROBROMIDE 20 MG PO TABS: 20.0000 mg | ORAL_TABLET | Freq: Every day | ORAL | Status: DC

## 2010-07-23 ENCOUNTER — Ambulatory Visit (INDEPENDENT_AMBULATORY_CARE_PROVIDER_SITE_OTHER): Payer: Self-pay | Admitting: Family Medicine

## 2010-07-23 ENCOUNTER — Encounter: Payer: Self-pay | Admitting: Family Medicine

## 2010-07-23 VITALS — BP 154/95 | HR 80 | Temp 99.0°F | Ht 62.0 in | Wt 206.4 lb

## 2010-07-23 DIAGNOSIS — R05 Cough: Secondary | ICD-10-CM | POA: Insufficient documentation

## 2010-07-23 DIAGNOSIS — R059 Cough, unspecified: Secondary | ICD-10-CM | POA: Insufficient documentation

## 2010-07-23 MED ORDER — CITALOPRAM HYDROBROMIDE 20 MG PO TABS: 20.0000 mg | ORAL_TABLET | Freq: Every day | ORAL | Status: DC

## 2010-07-23 MED ORDER — PRAVASTATIN SODIUM 40 MG PO TABS: 40.0000 mg | ORAL_TABLET | Freq: Every day | ORAL | Status: DC

## 2010-07-23 MED ORDER — DILTIAZEM HCL ER COATED BEADS 120 MG PO CP24: 120.0000 mg | ORAL_CAPSULE | Freq: Every day | ORAL | Status: DC

## 2010-07-23 MED ORDER — HYDROCHLOROTHIAZIDE 12.5 MG PO TABS: 12.5000 mg | ORAL_TABLET | Freq: Every day | ORAL | Status: DC

## 2010-07-23 MED ORDER — ACETAMINOPHEN-CODEINE #2 300-15 MG PO TABS: 1.0000 | ORAL_TABLET | Freq: Four times a day (QID) | ORAL | Status: AC | PRN

## 2010-07-23 NOTE — Patient Instructions (Signed)
It was nice to meet you, Ms. Rio. [facility] and sign up for 90 day plan to receive discount. Please call MD or return to clinic if you continue to cough in the next 2-4 weeks. Please take Tylenol w/ codeine as needed for severe cough.  This may cause drowsiness.  Avoid driving or operating heavy machinery. If you have any questions or concerns, please call us. Thank you.

## 2010-07-23 NOTE — Assessment & Plan Note (Signed)
Because cough is chronic and patient has been taking Lisinopril for >3 years, will discontinue Lisinopril today.   Will start Diltiazem 120mg  daily and HCTZ 12.5mg  daily.   Patient does not have insurance, so advised patient to sign up for [facility].   Will give patient Tylenol #2 to relieve cough at night.  Advised patient to avoid driving or operating heavy machinery after taking Tylenol #2.   She may stop taking Zyrtec and Delsym.  If symptoms do not improve in 2-3 weeks, patient is to call MD or return to clinic.   May consider CXR at that time.  Patient understood and agreed with plan.   Spoke with Pharmacy who is aware of the discontinuation of ACE-I.

## 2010-07-23 NOTE — Progress Notes (Signed)
  Subjective:    Patient ID: Tanya [name], female    DOB: 07-04-1955, 55 y.o.   MRN: [id number]  HPI This is a 55 year old female who presents to clinic with CC: cough.  Has been going on for 2 months now.  Pattern is intermittent.  Initially, patient thought cough was related to seasonal allergies.  Has been taking Zyrtec and Delsym with no relief.  Patient says throat is itchy, not sore.  Cough keeps her up at night and starting to "affect my life so much."  Cough is productive - clear sputum.  Denies any chest pain, rhinorrhea, hemoptysis, congestion.  Denies any fever, chills, NS, nausea or vomiting.  Non-smoker.  Has been taking ACE-I for >3 years.    Review of Systems Per HPI    Objective:   Physical Exam  Constitutional: She appears well-developed and well-nourished.  HENT:  Head: Normocephalic and atraumatic.  Mouth/Throat: Oropharynx is clear and moist.  Cardiovascular: Normal rate and regular rhythm.  Exam reveals no gallop and no friction rub.   No murmur heard. Pulmonary/Chest: No respiratory distress. She exhibits no tenderness.       Distant breath sounds, but no wheezes, rales, or rhonchi appreciated.          Assessment & Plan:

## 2010-08-10 ENCOUNTER — Ambulatory Visit: Payer: Self-pay | Admitting: Family Medicine

## 2010-08-16 ENCOUNTER — Other Ambulatory Visit: Payer: Self-pay | Admitting: Family Medicine

## 2010-08-17 NOTE — Telephone Encounter (Signed)
Refill request

## 2010-09-01 ENCOUNTER — Telehealth: Payer: Self-pay | Admitting: Family Medicine

## 2010-09-01 NOTE — Telephone Encounter (Signed)
Patient reports BP earlier today 130/89 then two hours later 141/94. On Sunday BP 119/77. Advised patient that she needs appointment to follow up with MD about BP and appointment is scheduled for 09/21/2010. Advised to continue BP meds as directed . Suggested she come in one day for me to check BP in our office.

## 2010-09-01 NOTE — Telephone Encounter (Signed)
Pt asking to speak with RN, says she was put on bp medicine but doesn't think it is controlling her bp, last reading today was 141/94, Dr. Louanne Belton is booked up until 1st part of July, pt wants to know what she should do?

## 2010-09-21 ENCOUNTER — Ambulatory Visit (INDEPENDENT_AMBULATORY_CARE_PROVIDER_SITE_OTHER): Payer: Self-pay | Admitting: Family Medicine

## 2010-09-21 ENCOUNTER — Encounter: Payer: Self-pay | Admitting: Family Medicine

## 2010-09-21 DIAGNOSIS — I10 Essential (primary) hypertension: Secondary | ICD-10-CM

## 2010-09-21 DIAGNOSIS — F411 Generalized anxiety disorder: Secondary | ICD-10-CM

## 2010-09-21 MED ORDER — HYDROCHLOROTHIAZIDE 25 MG PO TABS: 25.0000 mg | ORAL_TABLET | Freq: Every day | ORAL | Status: DC

## 2010-09-21 NOTE — Patient Instructions (Addendum)
It was great to see you today! I have sent in your new prescription.  Until you run out of your current hydrochlorothiazide take 2 of your current tablets daily. Keep on doing exercises for your arm. I would recommend trying to transition to shoes without heels as I think those are the cause of your foot pain.  You can take tylenol and motrin in the mean time. Come back to see me in a month after you get done visiting your mother.

## 2010-09-24 NOTE — Assessment & Plan Note (Signed)
The patient appears to be benefiting from her Celexa is having relatively minimal side effects. Will continue this medication for now. Discussed the risks and benefits of continuation of this medication with the patient. She understands this and is agreeable with continuing it.

## 2010-09-24 NOTE — Progress Notes (Signed)
Subjective: the patient presents today for follow-up on medication changes.  One: at her last appointment, the patient was started on diltiazem as her lisinopril was causing significant problems with cough. The patient reports that her cough is improved, however she has noted significant problems with elevated diastolic blood pressure. She reports that whenever she checks her own blood pressure the diastolic is typically between 90 and 100.  Two: the patient reports that her Celexa is helping her with her anxiety about driving. She is significantly more comfortable with driving now, and has significantly fewer problems with anxiety. She does not appear to be experiencing any significant side effects from the medication and is interested in continuing it.  Objective: vital signs reviewed. Blood pressure noted to be above goal.  General: no acute distress Cardiovascular: regular rate and rhythm. Respiratory: clear to auscultation bilaterally. Extremities: no edema.

## 2010-09-24 NOTE — Assessment & Plan Note (Signed)
As the patient is going to be visiting her mother for the next 3 weeks, I do not want to make significant changes in her medications.  Will titrate HCTZ up to 25mg  daily and see her back after she is done visiting her mother.

## 2010-09-28 ENCOUNTER — Telehealth: Payer: Self-pay | Admitting: Family Medicine

## 2010-09-28 NOTE — Telephone Encounter (Signed)
Hospital changed dose of Hydrochlorothiazide from 12.5mg  to 25mg  and asked patient to call the office to make Dr. Louanne Belton aware.  [facility] [address] [facility]

## 2010-09-28 NOTE — Telephone Encounter (Signed)
Umm, I changed the patient's dose at her last visit (the 10th of this month) and I already sent in that prescription.  It should be available for pickup whenever the patient runs out of her 12.5mg  tablets that she is taking two of daily.  Can you clarify if the patient actually needs something from me at this point?

## 2010-09-29 ENCOUNTER — Other Ambulatory Visit: Payer: Self-pay | Admitting: Family Medicine

## 2010-09-29 NOTE — Telephone Encounter (Signed)
LVM for patient to call back. ?

## 2010-09-29 NOTE — Telephone Encounter (Signed)
Called and told pt that her Rx has already been sent into the pharmacy and she can p/u at her convenience .Marland KitchenLoralee Pacas [place]

## 2010-11-11 ENCOUNTER — Telehealth: Payer: Self-pay | Admitting: Family Medicine

## 2010-11-11 NOTE — Telephone Encounter (Signed)
Patient is calling about a refill on HCTZ but she was confused about what the dosage needs to be now.  The hospital had changed it from 12.5 to 25 and she would like to continue with the 25mg .  She also said she is almost out.  [facility] [address]  I instructed Ms. Warrell to contact [facility] and ask them to send the refill request and that if the dosage needs to be change Dr. Louanne Belton can make that change on that request.

## 2010-11-12 ENCOUNTER — Other Ambulatory Visit: Payer: Self-pay | Admitting: Family Medicine

## 2010-11-12 MED ORDER — HYDROCHLOROTHIAZIDE 25 MG PO TABS: 25.0000 mg | ORAL_TABLET | Freq: Every day | ORAL | Status: DC

## 2010-11-16 ENCOUNTER — Other Ambulatory Visit: Payer: Self-pay | Admitting: Family Medicine

## 2010-11-16 NOTE — Telephone Encounter (Signed)
Tanya [name] is calling back checking on the refill.  [facility] called [facility] [address] last week and they said they have sent the request but have not heard back.

## 2010-11-16 NOTE — Telephone Encounter (Signed)
Tanya [name] is calling back because the [facility] [address] is saying they have not received the refill Rx for her HCTZ.

## 2010-11-17 MED ORDER — HYDROCHLOROTHIAZIDE 25 MG PO TABS: 25.0000 mg | ORAL_TABLET | Freq: Every day | ORAL | Status: DC

## 2010-12-27 ENCOUNTER — Ambulatory Visit (INDEPENDENT_AMBULATORY_CARE_PROVIDER_SITE_OTHER): Payer: Self-pay | Admitting: Family Medicine

## 2010-12-27 ENCOUNTER — Encounter: Payer: Self-pay | Admitting: Family Medicine

## 2010-12-27 DIAGNOSIS — I10 Essential (primary) hypertension: Secondary | ICD-10-CM

## 2010-12-27 DIAGNOSIS — F411 Generalized anxiety disorder: Secondary | ICD-10-CM

## 2010-12-27 MED ORDER — DILTIAZEM HCL ER COATED BEADS 180 MG PO CP24: 180.0000 mg | ORAL_CAPSULE | Freq: Every day | ORAL | Status: DC

## 2010-12-27 NOTE — Assessment & Plan Note (Signed)
I advised the patient that I did not think the herbal medication is likely to be harmful but that it is also unlikely to be helpful. I recommended that if she feels like she continues to need some help with anxiety to speak with her primary care physician about restarting Celexa or another medication.

## 2010-12-27 NOTE — Assessment & Plan Note (Signed)
It appears that patient's blood pressure is at times mildly above goal with a systolic in the 90s. I will increase her Cardizem from 120 mg to 180 mg and she will continue hydrochlorothiazide 25 mg.  Today he values from her home blood pressure monitor is high compared to today's office reading but I suspect given bulk of her readings are consistent with each other that they represent fairly accurate readings. I advised her to continue using her blood pressure monitor and keeping her log but not to worry about isolated episodes of diastolic hypertension.  Patient will followup with her PCP in 3-4 week to monitor blood pressure on new medication.

## 2010-12-27 NOTE — Patient Instructions (Signed)
i sent new increased dose of cardizem to your pharmacy  Continue to keep your blood pressure log.  Great job!!!  Make follow-up with Dr. Louanne Belton in 3-4 weeks.

## 2010-12-27 NOTE — Progress Notes (Signed)
  Subjective:    Patient ID: Tanya [name], female    DOB: [birth year], 55 y.o.   MRN: [id number]  HPI 55 year old patient with hypertension here as a work in appointment to discuss elevated blood pressures for several weeks.  Patient denies any symptoms to include chest pain, dyspnea, and edema. She notes a mild headache.  She brings in her ambulatory blood monitor today. We reviewed its memory which includes blood pressure values of 130/90, 132/91, 140/95, 138/96, 124/81, 127/97, 145/104, 123/92, 125/92, 123/80, 129/91.  She has been taking hydrochlorothiazide 25 mg daily and diltiazem 120 mg daily.  Depression: She states that under her primary care physician's guidance she has titrated off of Celexa and is doing well. She asked me a bout a mood supplement which appears to have some B. vitamins and herbs. She wonders if she should continue taking this.    Review of Systems see history of present illness     Objective:   Physical Exam GEN: Alert & Oriented, No acute distress CV:  Regular Rate & Rhythm, no murmur Respiratory:  Normal work of breathing, CTAB Abd:  + BS, soft, no tenderness to palpation Ext: no pre-tibial edema  HER BP monitor read 151/103, I manually rechecked it with a normal cuff and got 135/85.       Assessment & Plan:

## 2011-02-15 ENCOUNTER — Other Ambulatory Visit: Payer: Self-pay | Admitting: Family Medicine

## 2011-02-15 MED ORDER — PRAVASTATIN SODIUM 40 MG PO TABS: 40.0000 mg | ORAL_TABLET | Freq: Every day | ORAL | Status: DC

## 2011-10-24 ENCOUNTER — Other Ambulatory Visit: Payer: Self-pay | Admitting: Family Medicine

## 2011-10-24 MED ORDER — DILTIAZEM HCL ER COATED BEADS 120 MG PO CP24: 120.0000 mg | ORAL_CAPSULE | Freq: Every day | ORAL | Status: DC

## 2011-10-24 MED ORDER — HYDROCHLOROTHIAZIDE 25 MG PO TABS: 25.0000 mg | ORAL_TABLET | Freq: Every day | ORAL | Status: DC

## 2011-10-24 NOTE — Telephone Encounter (Signed)
Pt is out of BP meds and would like to have 4 pills to last her until she comes in on 8.15.2013. Forwarded to pcp.Loralee Pacas [place]

## 2011-10-26 ENCOUNTER — Other Ambulatory Visit: Payer: Self-pay | Admitting: Family Medicine

## 2011-10-26 MED ORDER — DILTIAZEM HCL ER COATED BEADS 120 MG PO CP24: 120.0000 mg | ORAL_CAPSULE | Freq: Every day | ORAL | Status: DC

## 2011-10-27 ENCOUNTER — Encounter: Payer: Self-pay | Admitting: Family Medicine

## 2011-10-27 ENCOUNTER — Ambulatory Visit (INDEPENDENT_AMBULATORY_CARE_PROVIDER_SITE_OTHER): Payer: Self-pay | Admitting: Family Medicine

## 2011-10-27 VITALS — BP 144/88 | HR 84 | Temp 98.6°F | Ht 62.0 in | Wt 209.8 lb

## 2011-10-27 DIAGNOSIS — E785 Hyperlipidemia, unspecified: Secondary | ICD-10-CM

## 2011-10-27 DIAGNOSIS — I10 Essential (primary) hypertension: Secondary | ICD-10-CM

## 2011-10-27 MED ORDER — DILTIAZEM HCL ER COATED BEADS 120 MG PO CP24: 120.0000 mg | ORAL_CAPSULE | Freq: Every day | ORAL | Status: DC

## 2011-10-27 MED ORDER — LISINOPRIL-HYDROCHLOROTHIAZIDE 10-12.5 MG PO TABS: 1.0000 | ORAL_TABLET | Freq: Every day | ORAL | Status: DC

## 2011-10-27 NOTE — Progress Notes (Signed)
Patient ID: Tanya [name], female   DOB: [birth year], 56 y.o.   MRN: [id number] Subjective: The patient is a 56 y.o. year old female who presents today for htn f/u.  1. HTN: Patient does not routinely check bp.  No headaches, cp/SOB/n/v/d.  2. HLD: Patient currently on pravachol 40mg .  No myalgias.  Last LDL at goal.  3. Ear pain: present x1 day, no changes in hearing, no discharge.  No fevers/chills.  Patient's past medical, social, and family history were reviewed and updated as appropriate. History  Substance Use Topics  . Smoking status: Never Smoker   . Smokeless tobacco: Never Used  . Alcohol Use: Not on file   Objective:  Filed Vitals:   10/27/11 1446  BP: 144/88  Pulse: 84  Temp: 98.6 F (37 C)   Gen: NAD, obese HEENT: TM normal b/l, no concerning findings related to ears CV: RRR, no murmurs Resp: CTABL Ext: No edema, 2+ pulses  Assessment/Plan: Ear pain: No obvious cause.  Has only been present 1 day with no other symptoms.  Rec watchful waiting.  Please also see individual problems in problem list for problem-specific plans.

## 2011-10-27 NOTE — Patient Instructions (Signed)
It was good to see you today! I have sent in a refill of 90 of your diltiazem pills. I have sent in a new medication for your blood pressure.  You take it one time per day.  It is a combination of hydrochlorothiazide with another medication. I would like you to come back some time early next week to get your cholesterol checked first thing in the morning.  We will also check your kidney function at that time.

## 2011-10-27 NOTE — Assessment & Plan Note (Signed)
Will order FLP to be collected sometime in the future.  Will notify patient of results if we need to change therapy.

## 2011-10-27 NOTE — Assessment & Plan Note (Signed)
Mildly elevated today.  Will try combo hctz/lisinopril today.  Patient may have history of cough related th lisinopril.  Will have her monitor for return of these symptoms but, due to monitairy concerns, would prefer combo pill if possible.  Pt to call if cough returns.

## 2011-12-05 ENCOUNTER — Other Ambulatory Visit: Payer: Self-pay | Admitting: *Deleted

## 2011-12-05 MED ORDER — PRAVASTATIN SODIUM 40 MG PO TABS: 40.0000 mg | ORAL_TABLET | Freq: Every day | ORAL | Status: DC

## 2011-12-07 ENCOUNTER — Other Ambulatory Visit: Payer: Self-pay | Admitting: Family Medicine

## 2011-12-07 MED ORDER — LISINOPRIL-HYDROCHLOROTHIAZIDE 10-12.5 MG PO TABS: 1.0000 | ORAL_TABLET | Freq: Every day | ORAL | Status: DC

## 2012-09-06 ENCOUNTER — Other Ambulatory Visit: Payer: Self-pay | Admitting: *Deleted

## 2012-09-07 ENCOUNTER — Other Ambulatory Visit: Payer: Self-pay | Admitting: *Deleted

## 2012-09-07 MED ORDER — PRAVASTATIN SODIUM 40 MG PO TABS: 40.0000 mg | ORAL_TABLET | Freq: Every day | ORAL | Status: DC

## 2012-10-26 ENCOUNTER — Encounter: Payer: Self-pay | Admitting: Sports Medicine

## 2012-10-29 ENCOUNTER — Other Ambulatory Visit: Payer: Self-pay | Admitting: *Deleted

## 2012-10-29 MED ORDER — DILTIAZEM HCL ER COATED BEADS 120 MG PO CP24: 120.0000 mg | ORAL_CAPSULE | Freq: Every day | ORAL | Status: DC

## 2012-10-29 NOTE — Telephone Encounter (Signed)
Pt called and informed of needed appointment - pt will call and make appointment in the next month. Wyatt Haste, RN-BSN

## 2012-11-29 ENCOUNTER — Ambulatory Visit (HOSPITAL_COMMUNITY)
Admission: RE | Admit: 2012-11-29 | Discharge: 2012-11-29 | Disposition: A | Discharge status: Home / Self Care | Payer: BC Managed Care – PPO | Source: Ambulatory Visit | Attending: Sports Medicine | Admitting: Sports Medicine

## 2012-11-29 ENCOUNTER — Other Ambulatory Visit (HOSPITAL_COMMUNITY)
Admission: RE | Admit: 2012-11-29 | Discharge: 2012-11-29 | Disposition: A | Discharge status: Home / Self Care | Payer: BC Managed Care – PPO | Source: Ambulatory Visit | Attending: Sports Medicine | Admitting: Sports Medicine

## 2012-11-29 ENCOUNTER — Ambulatory Visit (INDEPENDENT_AMBULATORY_CARE_PROVIDER_SITE_OTHER): Payer: BC Managed Care – PPO | Admitting: Sports Medicine

## 2012-11-29 ENCOUNTER — Encounter: Payer: Self-pay | Admitting: Sports Medicine

## 2012-11-29 VITALS — BP 130/77 | HR 85 | Temp 99.4°F | Ht 62.0 in | Wt 209.0 lb

## 2012-11-29 DIAGNOSIS — E785 Hyperlipidemia, unspecified: Secondary | ICD-10-CM

## 2012-11-29 DIAGNOSIS — Z124 Encounter for screening for malignant neoplasm of cervix: Secondary | ICD-10-CM

## 2012-11-29 DIAGNOSIS — I10 Essential (primary) hypertension: Secondary | ICD-10-CM | POA: Insufficient documentation

## 2012-11-29 DIAGNOSIS — Z23 Encounter for immunization: Secondary | ICD-10-CM

## 2012-11-29 DIAGNOSIS — R0989 Other specified symptoms and signs involving the circulatory and respiratory systems: Secondary | ICD-10-CM | POA: Insufficient documentation

## 2012-11-29 DIAGNOSIS — R0600 Dyspnea, unspecified: Secondary | ICD-10-CM | POA: Insufficient documentation

## 2012-11-29 DIAGNOSIS — R0609 Other forms of dyspnea: Secondary | ICD-10-CM

## 2012-11-29 DIAGNOSIS — F411 Generalized anxiety disorder: Secondary | ICD-10-CM

## 2012-11-29 DIAGNOSIS — Z01419 Encounter for gynecological examination (general) (routine) without abnormal findings: Secondary | ICD-10-CM | POA: Insufficient documentation

## 2012-11-29 DIAGNOSIS — Z1151 Encounter for screening for human papillomavirus (HPV): Secondary | ICD-10-CM | POA: Insufficient documentation

## 2012-11-29 DIAGNOSIS — E669 Obesity, unspecified: Secondary | ICD-10-CM

## 2012-11-29 LAB — COMPREHENSIVE METABOLIC PANEL
ALT: 13 U/L (ref 0–35)
AST: 13 U/L (ref 0–37)
Albumin: 4.2 g/dL (ref 3.5–5.2)
Alkaline Phosphatase: 66 U/L (ref 39–117)
Glucose, Bld: 123 mg/dL — ABNORMAL HIGH (ref 70–99)
Potassium: 3.7 mEq/L (ref 3.5–5.3)
Sodium: 140 mEq/L (ref 135–145)
Total Bilirubin: 0.5 mg/dL (ref 0.3–1.2)
Total Protein: 7.2 g/dL (ref 6.0–8.3)

## 2012-11-29 LAB — LIPID PANEL
LDL Cholesterol: 74 mg/dL (ref 0–99)
Total CHOL/HDL Ratio: 2.9 Ratio

## 2012-11-29 MED ORDER — CITALOPRAM HYDROBROMIDE 20 MG PO TABS: 20.0000 mg | ORAL_TABLET | Freq: Every day | ORAL | Status: DC

## 2012-11-29 MED ORDER — AMLODIPINE BESYLATE 5 MG PO TABS: 5.0000 mg | ORAL_TABLET | Freq: Every day | ORAL | Status: DC

## 2012-11-29 NOTE — Assessment & Plan Note (Signed)
Occasional DOE, pt not exercising on regular basis. Unclear reasons.  Recommend ETT.  Pt leaving country for [employer] funeral.  Will try to arrange when returns.  Given warning signs.   Consider anxiety component

## 2012-11-29 NOTE — Assessment & Plan Note (Signed)
Recheck labs. Will have return when returns from [place] to discuss ACSVD risk

## 2012-11-29 NOTE — Assessment & Plan Note (Signed)
Restart Celexa as done well in the past;  Needs follow up when returns.  Likely componet of coping given recent death of mother.

## 2012-11-29 NOTE — Progress Notes (Addendum)
[facility] Tanya [name] - 57 y.o. female MRN [id number]  Date of birth: [birth year]  CC & Reason for Visit  Tanya [name] presents today for Annual Exam  Pertinent History  No Patient Care Coordination Note on file. Otherwise please see associated EMR sections for complete problem List, past medical history, past surgical history, family history and social history. Further Care Coordination & Health Care Maintenance   No recent labs  Health Maintenance Due  Topic  . Colonoscopy   . Mammogram   . Influenza Vaccine      HPI, Interval History & ROS  Tanya [name] is here today to followup on the chronic medical conditions as outlined above and in the assessment and plan.    She reports that her mother recently passed away and she's going to have to go to [place] for her funeral at some point in the next one to 2 months.  Patient reports he previously regular heart rate over 20 years ago.  She has never had any recurrence of the symptoms.  She is not on medications that time.  She does report some chest tightness/dyspnea with walking up steps on occasion but this is not reproducible. Pt denies overt chest pain, dyspnea at rest,  orthopnea/PND, lower extremity edema. No history of stress test.  She is due for a Pap smear. Pt denies any fevers, chills, or rigors.  Denies any vaginal discharge LMP was greater than 4 years ago.  No recurrent bleeding  Pt reports acute problems with: # Depression and anxiety: she reports she has had issues with depression and anxiety previously.  She was on Celexa previously   That seemed to help.  She believes she is under a lot of stress at work and with her mother dying this is what exacerbated her symptoms.  She's been feeling this way for quite some time but has significantly worsened over the past 2 months.  She is not taking any medications.  She does not exercise and regular basis.   Objective Findings   VITALS: HR: 85 bpm  BP:  130/77 mmHg  TEMP: 99.4 F (37.4 C) (Oral)  RESP:     HT: 5\' 2"  (157.5 cm)  WT: 209 lb (94.802 kg)  BMI: Body mass index is 38.22 kg/(m^2).   BP Readings from Last 3 Encounters:  11/29/12 130/77  10/27/11 144/88  12/27/10 143/91   Wt Readings from Last 3 Encounters:  11/29/12 209 lb (94.802 kg)  10/27/11 209 lb 12.8 oz (95.165 kg)  12/27/10 210 lb (95.255 kg)     PHYSICAL EXAM: GENERAL: Adult AA  female. In no discomfort; no respiratory distress  PSYCH: alert and appropriate, good insight   HNEENT:   CARDIO: RRR, S1/S2 heard, no murmur  LUNGS: CTA B, no wheezes, no crackles  ABDOMEN: +BS, soft, non-tender, no rigidity, no guarding, no masses/hepatosplenomegaly  EXTREM:    GU: Chaperone: CMA  External: no skin lesions, normal appearing genitalia, no discharge  Vagina: no vaginal lesion, vaginal mucosa moist  Cervix: no cervical lesions, no CMT  Uterus: normal size, shape, consistency  non-tender  Adenexa: no masses, non-tender  TESTS:      SKIN:    EKG: NSR @ 73, PR interval 214, no ischemia, otherwise normal intervals  Medications & Orders   Previous Medications   PRAVASTATIN (PRAVACHOL) 40 MG TABLET    Take 1 tablet (40 mg total) by mouth at bedtime.   Modified Medications   No medications on file   New  Prescriptions   AMLODIPINE (NORVASC) 5 MG TABLET    Take 1 tablet (5 mg total) by mouth daily.   CITALOPRAM (CELEXA) 20 MG TABLET    Take 1 tablet (20 mg total) by mouth daily.   Discontinued Medications   DILTIAZEM (CARDIZEM CD) 120 MG 24 HR CAPSULE    Take 1 capsule (120 mg total) by mouth daily.   LISINOPRIL-HYDROCHLOROTHIAZIDE (PRINZIDE,ZESTORETIC) 10-12.5 MG PER TABLET    Take 1 tablet by mouth daily.   Orders Placed This Encounter  Procedures  . TSH  . Lipid panel  . Comprehensive metabolic panel  . EKG 12-Lead    Assessment & Plan    Problems addressed today: General Instructions:  1. HYPERLIPIDEMIA   2. OBESITY   3. Essential hypertension,  benign   4. Anxiety state, unspecified   5. Dyspnea on exertion   6. Screening for malignant neoplasm of the cervix   7. Need for prophylactic vaccination and inoculation against influenza     Pap smear performed today.    Flu shot today  See problem oriented documentation

## 2012-11-29 NOTE — Patient Instructions (Signed)
It was nice to see you today, thanks for coming in!  1. HYPERLIPIDEMIA We have checked labs  Essential hypertension, benign I am starting amlodipine for your BP  Anxiety state, unspecified I am restarting you on Celexa  5. Dyspnea on exertion You need to have an exercise stress test. I am going to have you see a cardiologist to arrange this.    Please plan to return to see me when you return from your trip.    If you need anything prior to your next visit please call the clinic. Please Bring all medications or accurate medication list with you to each appointment; an accurate medication list is essential in providing you the best care possible.

## 2012-11-29 NOTE — Assessment & Plan Note (Addendum)
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  Has never been in documented A fib or tachycardia.  Pt with chronic cough associated with lisinopril.  Will stop Lisinopril/HCTZ and cardizem and start on amlodipine.  No hx of DM = no indication for ACE.

## 2012-11-30 LAB — TSH: TSH: 1.418 u[IU]/mL (ref 0.350–4.500)

## 2012-12-06 ENCOUNTER — Telehealth: Payer: Self-pay | Admitting: Sports Medicine

## 2012-12-06 ENCOUNTER — Encounter: Payer: Self-pay | Admitting: Sports Medicine

## 2012-12-06 DIAGNOSIS — R0600 Dyspnea, unspecified: Secondary | ICD-10-CM

## 2012-12-06 MED ORDER — AMLODIPINE BESYLATE 5 MG PO TABS: 10.0000 mg | ORAL_TABLET | Freq: Every day | ORAL | Status: DC

## 2012-12-06 NOTE — Telephone Encounter (Signed)
Pt advised to increase Norvasc and that labs were all normal per Dr Berline Chough

## 2012-12-06 NOTE — Telephone Encounter (Signed)
Pt was given amlodipine for bp. She says her bp is still high and she has a headache all the time Please advise

## 2012-12-06 NOTE — Telephone Encounter (Signed)
Have pt increase Norvasc to 2 tablets each morning. Referall placed for cardiology due to DOE - was missed at last appointment. Labs are normal.  Letter with full results will be sent but no changes in medications.

## 2012-12-06 NOTE — Telephone Encounter (Signed)
Pt states BP readings are: 9/24 145/ 93, 9/25 AM 152/100,PM 138/96. Will forward to Dr Berline Chough for review.

## 2012-12-06 NOTE — Telephone Encounter (Signed)
Also wants to know her lab results

## 2012-12-31 ENCOUNTER — Telehealth: Payer: Self-pay | Admitting: Sports Medicine

## 2012-12-31 ENCOUNTER — Other Ambulatory Visit: Payer: Self-pay | Admitting: Sports Medicine

## 2012-12-31 MED ORDER — AMLODIPINE BESYLATE 10 MG PO TABS: 10.0000 mg | ORAL_TABLET | Freq: Every day | ORAL | Status: DC

## 2012-12-31 MED ORDER — CHLORTHALIDONE 25 MG PO TABS: 25.0000 mg | ORAL_TABLET | Freq: Every day | ORAL | Status: DC

## 2012-12-31 NOTE — Telephone Encounter (Signed)
Patient has doubled the dosage for amlodipine and her BP is still quite high. Also, meds have been making her feet swell. She is almost out of meds and will need a refill now that she increased dosages of meds.

## 2012-12-31 NOTE — Progress Notes (Signed)
Increased prescription for amlodipine to 10 mg daily. Added chlorthalidone for diuretic effect. Lower extremity swelling may be coming from amlodipine but should be helped by chlorthalidone which is also to help further lower her blood pressure. Okay to take both of these medicines at the same time in the morning.  Please have her come in for a blood pressure check in one week. If elevated greater than 140/90 will need to seen prior to her next scheduled visit.

## 2013-01-02 ENCOUNTER — Telehealth: Payer: Self-pay | Admitting: *Deleted

## 2013-01-02 ENCOUNTER — Ambulatory Visit: Payer: BC Managed Care – PPO | Admitting: Cardiovascular Disease

## 2013-01-02 ENCOUNTER — Ambulatory Visit: Payer: BC Managed Care – PPO

## 2013-01-02 NOTE — Telephone Encounter (Signed)
Patient scheduled to come in on 10/30 @ 2pm for BP check. Patient was informed of below   Increased prescription for amlodipine to 10 mg daily.  Added chlorthalidone for diuretic effect.  Lower extremity swelling may be coming from amlodipine but should be helped by chlorthalidone which is also to help further lower her blood pressure.  Okay to take both of these medicines at the same time in the morning.  Please have her come in for a blood pressure check in one week.  If elevated greater than 140/90 will need to seen prior to her next scheduled visit

## 2013-01-10 ENCOUNTER — Ambulatory Visit (INDEPENDENT_AMBULATORY_CARE_PROVIDER_SITE_OTHER): Payer: BC Managed Care – PPO | Admitting: *Deleted

## 2013-01-10 VITALS — BP 134/80 | HR 78

## 2013-01-10 DIAGNOSIS — I10 Essential (primary) hypertension: Secondary | ICD-10-CM

## 2013-01-10 NOTE — Progress Notes (Signed)
Patient in today for blood pressure check. Reports regular taking of medications (norvasc 10mg  and Chlorlithiladone 25mg ). Blood pressure taken manually in left arm was 134/80. Patient also complains of not being able to sleep at night and would like something called into her pharmacy. Advised patient I would let PCP know.

## 2013-01-11 NOTE — Progress Notes (Signed)
BP seems improved. Please call her and have her try OTC Benadryl/Diphenhydramine 1 hour prior to bed (25mg ).  For any Rx medications she will need to be seen to further discuss her sleep disorder. (She was supposed to be out of the country for a prolonged period - please have her come back in the next month if she is here)  Andrena Mews, DO [facility] Resident - PGY-3 01/11/2013 8:39 AM

## 2013-01-15 ENCOUNTER — Telehealth: Payer: Self-pay | Admitting: *Deleted

## 2013-01-17 ENCOUNTER — Other Ambulatory Visit: Payer: Self-pay | Admitting: Sports Medicine

## 2013-01-17 MED ORDER — CHLORTHALIDONE 25 MG PO TABS: 25.0000 mg | ORAL_TABLET | Freq: Every day | ORAL | Status: DC

## 2013-01-17 NOTE — Telephone Encounter (Addendum)
Completed refill  If Pt is traveling to [place] she will need prophylaxis against both milaria and Yellow Fever.  If she has not had the Yellow Fever immunization in the past 10 years she will need this done through a local travel clinic 10+ days prior to leaving for her trip.  If she requires this vaccine she should have all prophylaxis arranged through that clinic.    If she is up to date for Yellow Fever, I am comfortable Rx Doxycycline prophylaxis that she will need to start 1-2 days prior to her trip and continue until 4 weeks after she returns to the states.  Otherwise an office visit will need to occur with either myself or a travel clinic to discuss using Malarone prophylaxis (start 1-2 days prior and for 7 days after return).  Please call and inform pt of the above and let me know if she would like the Doxycycline Called in.  Andrena Mews, DO [facility] Resident - PGY-3 01/17/2013 5:41 PM

## 2013-01-17 NOTE — Progress Notes (Signed)
Completed refill

## 2013-01-17 NOTE — Telephone Encounter (Signed)
Patient calling to request refill for chlorthalidone.  Requesting #90 day supply since she will be going out of town.  [facility]  Also, wants to know if Dr. Berline Chough will be prescribing Rx malaria since she is traveling to [place].  Will route note to Dr. Berline Chough.  Gaylene Brooks, RN

## 2013-01-18 ENCOUNTER — Telehealth: Payer: Self-pay | Admitting: Sports Medicine

## 2013-01-18 NOTE — Telephone Encounter (Signed)
[facility]'t [facility] refill request 11/6 for [place]. jw

## 2013-01-18 NOTE — Telephone Encounter (Signed)
Patient informed. Tanya [name] S  

## 2013-02-22 ENCOUNTER — Telehealth: Payer: Self-pay | Admitting: Sports Medicine

## 2013-02-22 NOTE — Telephone Encounter (Signed)
Spoke with patent and informed her that she will see cardiologist first and then stress test will be decided on.

## 2013-02-22 NOTE — Telephone Encounter (Signed)
Patient was told that she was to get a stress test done but when [facility] called her they told her she was scheduled for an appt, not a stress test. Patient would like to know if this is correct or if she needs to have the stress test done. Appt scheduled for Monday 12/15. Please call patient back today

## 2013-02-22 NOTE — Telephone Encounter (Signed)
Please advise. Tanya [name] S  

## 2013-02-25 ENCOUNTER — Encounter: Payer: Self-pay | Admitting: *Deleted

## 2013-02-25 ENCOUNTER — Ambulatory Visit (INDEPENDENT_AMBULATORY_CARE_PROVIDER_SITE_OTHER): Payer: BC Managed Care – PPO | Admitting: Cardiovascular Disease

## 2013-02-25 VITALS — BP 118/80 | HR 72 | Ht 63.0 in | Wt 199.0 lb

## 2013-02-25 DIAGNOSIS — R0600 Dyspnea, unspecified: Secondary | ICD-10-CM

## 2013-02-25 DIAGNOSIS — R011 Cardiac murmur, unspecified: Secondary | ICD-10-CM | POA: Insufficient documentation

## 2013-02-25 DIAGNOSIS — R0989 Other specified symptoms and signs involving the circulatory and respiratory systems: Secondary | ICD-10-CM

## 2013-02-25 DIAGNOSIS — R002 Palpitations: Secondary | ICD-10-CM

## 2013-02-25 DIAGNOSIS — R0609 Other forms of dyspnea: Secondary | ICD-10-CM

## 2013-02-25 DIAGNOSIS — I10 Essential (primary) hypertension: Secondary | ICD-10-CM

## 2013-02-25 DIAGNOSIS — E669 Obesity, unspecified: Secondary | ICD-10-CM

## 2013-02-25 NOTE — Assessment & Plan Note (Signed)
Discussed low carb diet and exercise program  

## 2013-02-25 NOTE — Patient Instructions (Signed)
Your physician recommends that you schedule a follow-up appointment in: AS NEEDED  Your physician has requested that you have an exercise tolerance test. For further information please visit [email or web address] Please also follow instruction sheet, as given.   Your physician has requested that you have an echocardiogram. Echocardiography is a painless test that uses sound waves to create images of your heart. It provides your doctor with information about the size and shape of your heart and how well your heart's chambers and valves are working. This procedure takes approximately one hour. There are no restrictions for this procedure.   Your physician recommends that you continue on your current medications as directed. Please refer to the Current Medication list given to you today.

## 2013-02-25 NOTE — Progress Notes (Signed)
Patient ID: Tanya [name], female   DOB: 11-Feb-1956, 57 y.o.   MRN: [id number]    57 yo [place] female with HTN , elevated lipids and anxiety.  Has had cough with ACE and some LE edema with norvasc.  No smoking or DM  Describes some vague exertional dyspnea. Occasional tightness in chest related to anxiety and palpitations.  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   Has previously seen Dr Elsie Lincoln for heart murmur Years ago echo was "ok:   No history of DVT, pulmonary hypertension or frequent pneumonia      ROS: Denies fever, malais, weight loss, blurry vision, decreased visual acuity, cough, sputum, SOB, hemoptysis, pleuritic pain, palpitaitons, heartburn, abdominal pain, melena, lower extremity edema, claudication, or rash.  All other systems reviewed and negative   General: Affect appropriate Healthy:  appears stated age HEENT: normal Neck supple with no adenopathy JVP normal no bruits no thyromegaly Lungs clear with no wheezing and good diaphragmatic motion Heart:  S1/S2 systolic murmur,rub, gallop or click PMI normal Abdomen: benighn, BS positve, no tenderness, no AAA no bruit.  No HSM or HJR Distal pulses intact with no bruits No edema Neuro non-focal Skin warm and dry No muscular weakness  Medications Current Outpatient Prescriptions  Medication Sig Dispense Refill  . amLODipine (NORVASC) 10 MG tablet Take 1 tablet (10 mg total) by mouth daily.  90 tablet  3  . chlorthalidone (HYGROTON) 25 MG tablet Take 1 tablet (25 mg total) by mouth daily.  90 tablet  1  . citalopram (CELEXA) 20 MG tablet Take 1 tablet (20 mg total) by mouth daily.  90 tablet  1  . pravastatin (PRAVACHOL) 40 MG tablet Take 1 tablet (40 mg total) by mouth at bedtime.  90 tablet  2   No current facility-administered medications for this visit.      Allergies Review of patient's allergies indicates no known allergies.  Family History: No family history on file.  Social History: History   Social History  . Marital Status: Married    Spouse Name: N/A    Number of Children: N/A  . Years of Education: N/A   Occupational History  . Not on file.   Social History Main Topics  . Smoking status: Never Smoker   . Smokeless tobacco: Never Used  . Alcohol Use: Not on file  . Drug Use: Not on file  . Sexual Activity: Not on file   Other Topics Concern  . Not on file   Social History Narrative  . No narrative on file    Electrocardiogram:  SR rate 73 normal  PR 214    Assessment and Plan

## 2013-02-25 NOTE — Assessment & Plan Note (Signed)
Previously described  AV sclerosis or flow murmur No accentuation with valsalva.  F/U echo

## 2013-02-25 NOTE — Assessment & Plan Note (Signed)
Seems functional and related to obesity and anxiety.  F/U ETT and echo for her systolic murmur

## 2013-02-25 NOTE — Assessment & Plan Note (Signed)
Well controlled.  Continue current medications and low sodium Dash type diet.    

## 2013-02-28 ENCOUNTER — Ambulatory Visit (HOSPITAL_COMMUNITY): Payer: BC Managed Care – PPO | Attending: Cardiovascular Disease | Admitting: Radiology

## 2013-02-28 ENCOUNTER — Encounter: Payer: Self-pay | Admitting: Cardiology

## 2013-02-28 DIAGNOSIS — E785 Hyperlipidemia, unspecified: Secondary | ICD-10-CM | POA: Insufficient documentation

## 2013-02-28 DIAGNOSIS — I10 Essential (primary) hypertension: Secondary | ICD-10-CM | POA: Insufficient documentation

## 2013-02-28 DIAGNOSIS — R011 Cardiac murmur, unspecified: Secondary | ICD-10-CM

## 2013-02-28 DIAGNOSIS — R0602 Shortness of breath: Secondary | ICD-10-CM

## 2013-02-28 DIAGNOSIS — R609 Edema, unspecified: Secondary | ICD-10-CM | POA: Insufficient documentation

## 2013-02-28 NOTE — Progress Notes (Signed)
Echocardiogram performed.  

## 2013-03-05 ENCOUNTER — Telehealth: Payer: Self-pay | Admitting: Cardiovascular Disease

## 2013-03-05 ENCOUNTER — Ambulatory Visit (HOSPITAL_COMMUNITY)
Admission: RE | Admit: 2013-03-05 | Discharge: 2013-03-05 | Disposition: A | Discharge status: Home / Self Care | Payer: BC Managed Care – PPO | Source: Ambulatory Visit | Attending: Cardiovascular Disease | Admitting: Cardiovascular Disease

## 2013-03-05 DIAGNOSIS — R0609 Other forms of dyspnea: Secondary | ICD-10-CM | POA: Insufficient documentation

## 2013-03-05 DIAGNOSIS — R0989 Other specified symptoms and signs involving the circulatory and respiratory systems: Secondary | ICD-10-CM

## 2013-03-05 DIAGNOSIS — R002 Palpitations: Secondary | ICD-10-CM

## 2013-03-05 DIAGNOSIS — R0600 Dyspnea, unspecified: Secondary | ICD-10-CM

## 2013-03-05 NOTE — Telephone Encounter (Signed)
New message     Pt had treadmill test today at [place] [facility].  She did not take her bp medication prior to test.  She want to know if she can take it now?

## 2013-03-05 NOTE — Telephone Encounter (Signed)
Pt is aware of echo results Debbie Schuyler Olden RN  

## 2013-03-05 NOTE — Telephone Encounter (Signed)
New problem    Test results -echo.

## 2013-03-05 NOTE — Telephone Encounter (Signed)
Spoke with pt who reports she did not take amlodipine or chlorthalidone this AM prior to stress test.  She will resume these tomorrow AM

## 2013-03-11 ENCOUNTER — Telehealth: Payer: Self-pay | Admitting: *Deleted

## 2013-03-11 NOTE — Telephone Encounter (Signed)
Reather Converse ','<More Detail >>       Wendall Stade, MD       Sent: Caleen Essex March 08, 2013 11:07 PM    To: Alois Cliche, LPN                   Message     Normal stress test        ----- Message -----    From: Mickel Duhamel Moffitt    Sent: 03/06/2013 5:59 AM    To: Wendall Stade, MD                                  Results Exercise Tolerance Test (Order [id number])      Exercise Tolerance Test   Status: Final result Visible to patient: This result is not viewable by the patient. Next appt: None         Specimen Collected: 03/05/13 11:31 AM Last Resulted: 03/05/13 6:59 PM          Imaging Results     View Image               Result Information     Status     Final result (03/05/2013 6:59 PM)     Provider Status: Ordered    Encounter      View Encounter                Lab Information     [facility]               Order-Level Documents:     There are no order-level documents.         Exercise Tolerance Test (Order [id number]) Released By: Auto-released Date: 03/05/2013  ECG Authorizing: Provider Default, MD Department:  CARDIOVASCULAR Mat[facility] Sprang  Order: [id number]         Provider Default, MD  NPI:         Order Information     Order Date/Time Release Date/Time Start Date/Time End Date/Time    03/05/13 11:31 AM 03/05/13 11:31 AM 03/05/13 11:31 AM 03/05/13 11:31 AM           Order Details     Frequency Duration Priority Order Class    Once 1 occurrence Routine Normal           Comments     Ordered by Charlton Haws           Order History Inpatient     Date/Time Action Taken User Additional Information    03/05/13 1131 Release  From Order: [id number]    03/05/13 1859 Result Lab In [facility] Interface Final           Collection Information     Collected: 03/05/2013 11:31 AM Resulting Agency: [facility]          Patient Information     Patient Name Sex  DOB SSN    Tanya, [name] Female 09/21/1955 [id number]           Additional Information     Associated Reports    View Parent Encounter    Priority and Order Details    Collection Information.           Order Building surveyor [facility] Interface -- -- --  Authorizing Provider Provider Default, MD [phone] -- --            Encounter     View Encounter            Order-Level Documents:     There are no order-level documents.         PT AWARE OF NORMAL  GTT PER  DR NISHAN./CY

## 2013-03-12 ENCOUNTER — Telehealth: Payer: Self-pay | Admitting: *Deleted

## 2013-03-12 NOTE — Telephone Encounter (Signed)
         Reather Converse ','<More Detail >>       Wendall Stade, MD       Sent: Tue March 12, 2013 12:36 PM    To: Alois Cliche, LPN                   Message     Echo shows hyperdynamic EF no bad valves overall good        ----- Message -----    From: Alois Cliche, LPN    Sent: 40/98/1191 12:03 PM    To: Wendall Stade, MD        NOT SURE IF YOU REVIEWED DID NOT GET RESULTS TO CALL PT WITH ./CY                          Pt aware of  Echo results./cy

## 2013-03-28 ENCOUNTER — Ambulatory Visit (INDEPENDENT_AMBULATORY_CARE_PROVIDER_SITE_OTHER): Payer: No Typology Code available for payment source | Admitting: *Deleted

## 2013-03-28 DIAGNOSIS — Z23 Encounter for immunization: Secondary | ICD-10-CM

## 2013-05-01 ENCOUNTER — Other Ambulatory Visit: Payer: Self-pay | Admitting: Sports Medicine

## 2013-05-01 MED ORDER — AMLODIPINE BESYLATE 10 MG PO TABS: 10.0000 mg | ORAL_TABLET | Freq: Every day | ORAL | Status: DC

## 2013-05-01 MED ORDER — PRAVASTATIN SODIUM 40 MG PO TABS: 40.0000 mg | ORAL_TABLET | Freq: Every day | ORAL | Status: DC

## 2013-05-01 MED ORDER — CHLORTHALIDONE 25 MG PO TABS: 25.0000 mg | ORAL_TABLET | Freq: Every day | ORAL | Status: DC

## 2013-05-02 ENCOUNTER — Telehealth: Payer: Self-pay | Admitting: Sports Medicine

## 2013-05-02 ENCOUNTER — Other Ambulatory Visit: Payer: Self-pay | Admitting: Sports Medicine

## 2013-05-02 MED ORDER — CHLORTHALIDONE 25 MG PO TABS: 25.0000 mg | ORAL_TABLET | Freq: Every day | ORAL | Status: DC

## 2013-05-02 MED ORDER — AMLODIPINE BESYLATE 10 MG PO TABS: 10.0000 mg | ORAL_TABLET | Freq: Every day | ORAL | Status: DC

## 2013-05-02 MED ORDER — PRAVASTATIN SODIUM 40 MG PO TABS: 40.0000 mg | ORAL_TABLET | Freq: Every day | ORAL | Status: DC

## 2013-05-02 NOTE — Telephone Encounter (Signed)
[facility] [address] this [facility] time. Can we send them there today. jw

## 2013-05-02 NOTE — Telephone Encounter (Signed)
[facility]

## 2013-05-02 NOTE — Telephone Encounter (Signed)
Relayed message,pt voiced understanding. Tanya [name], [name]

## 2013-11-01 ENCOUNTER — Other Ambulatory Visit: Payer: Self-pay | Admitting: Sports Medicine

## 2013-11-02 ENCOUNTER — Other Ambulatory Visit: Payer: Self-pay | Admitting: Sports Medicine

## 2013-11-06 ENCOUNTER — Other Ambulatory Visit: Payer: Self-pay | Admitting: Sports Medicine

## 2014-01-23 ENCOUNTER — Other Ambulatory Visit: Payer: Self-pay

## 2014-01-23 ENCOUNTER — Ambulatory Visit (INDEPENDENT_AMBULATORY_CARE_PROVIDER_SITE_OTHER): Payer: No Typology Code available for payment source | Admitting: *Deleted

## 2014-01-23 ENCOUNTER — Ambulatory Visit (INDEPENDENT_AMBULATORY_CARE_PROVIDER_SITE_OTHER): Payer: No Typology Code available for payment source | Admitting: Family Medicine

## 2014-01-23 ENCOUNTER — Encounter: Payer: Self-pay | Admitting: Family Medicine

## 2014-01-23 VITALS — BP 136/86 | HR 93 | Temp 98.2°F | Ht 63.0 in | Wt 201.0 lb

## 2014-01-23 DIAGNOSIS — Z23 Encounter for immunization: Secondary | ICD-10-CM

## 2014-01-23 DIAGNOSIS — Z5181 Encounter for therapeutic drug level monitoring: Secondary | ICD-10-CM

## 2014-01-23 DIAGNOSIS — I10 Essential (primary) hypertension: Secondary | ICD-10-CM

## 2014-01-23 DIAGNOSIS — E785 Hyperlipidemia, unspecified: Secondary | ICD-10-CM

## 2014-01-23 DIAGNOSIS — H0520 Unspecified exophthalmos: Secondary | ICD-10-CM

## 2014-01-23 DIAGNOSIS — Z1231 Encounter for screening mammogram for malignant neoplasm of breast: Secondary | ICD-10-CM

## 2014-01-23 DIAGNOSIS — E669 Obesity, unspecified: Secondary | ICD-10-CM

## 2014-01-23 DIAGNOSIS — F411 Generalized anxiety disorder: Secondary | ICD-10-CM

## 2014-01-23 LAB — CBC
HEMATOCRIT: 39.9 % (ref 36.0–46.0)
HEMOGLOBIN: 13.5 g/dL (ref 12.0–15.0)
MCH: 25 pg — ABNORMAL LOW (ref 26.0–34.0)
MCHC: 33.8 g/dL (ref 30.0–36.0)
MCV: 73.8 fL — ABNORMAL LOW (ref 78.0–100.0)
Platelets: 243 10*3/uL (ref 150–400)
RBC: 5.41 MIL/uL — ABNORMAL HIGH (ref 3.87–5.11)
RDW: 15.9 % — ABNORMAL HIGH (ref 11.5–15.5)
WBC: 7.6 10*3/uL (ref 4.0–10.5)

## 2014-01-23 LAB — LIPID PANEL
CHOL/HDL RATIO: 2.8 ratio
Cholesterol: 137 mg/dL (ref 0–200)
HDL: 49 mg/dL (ref >39)
LDL CALC: 61 mg/dL (ref 0–99)
Triglycerides: 135 mg/dL (ref <150)
VLDL: 27 mg/dL (ref 0–40)

## 2014-01-23 LAB — CALCIUM: Calcium: 9.3 mg/dL (ref 8.4–10.5)

## 2014-01-23 LAB — BASIC METABOLIC PANEL
BUN: 9 mg/dL (ref 6–23)
CHLORIDE: 96 meq/L (ref 96–112)
CO2: 32 meq/L (ref 19–32)
Calcium: 9.3 mg/dL (ref 8.4–10.5)
Creat: 0.72 mg/dL (ref 0.50–1.10)
GLUCOSE: 117 mg/dL — AB (ref 70–99)
Potassium: 2.9 mEq/L — ABNORMAL LOW (ref 3.5–5.3)
SODIUM: 138 meq/L (ref 135–145)

## 2014-01-23 LAB — TSH: TSH: 1.295 u[IU]/mL (ref 0.350–4.500)

## 2014-01-23 LAB — POCT GLYCOSYLATED HEMOGLOBIN (HGB A1C): Hemoglobin A1C: 7.8

## 2014-01-23 LAB — MAGNESIUM: MAGNESIUM: 2.2 mg/dL (ref 1.5–2.5)

## 2014-01-23 MED ORDER — LISINOPRIL 20 MG PO TABS: 20.0000 mg | ORAL_TABLET | Freq: Every day | ORAL | Status: DC

## 2014-01-23 NOTE — Assessment & Plan Note (Signed)
10-year ASCVD risk 5.5%. Continue pravastatin, recheck labs.

## 2014-01-23 NOTE — Assessment & Plan Note (Addendum)
BMI 35.61. Discussed therapeutic lifestyle modifications as below. Will check Hb A1c due to risk for insulin resistance.

## 2014-01-23 NOTE — Progress Notes (Signed)
Patient ID: Tanya [name], female   DOB: 01-17-1956, 58 y.o.   MRN: [id number]   Subjective:   Tanya [name] is a 58 y.o. female with a history of HTN, hyperlipidemia here for an annual exam.  Exercise: Walks sometimes but generally does not exercise. Takes stairs to work on 2nd floor.  Diet: African and [place] food. Avoids pork. Plenty of white meat chicken, rice. Plenty of vegetables.  Takes dulcolax for chronic constipation which she reports began after starting chlorthalidone.   She is traveling back to [name] in a few months. Wants to discuss natural supplement for anxiety and melatonin for sleep prior to taking either of these.   No obstetric history on file. Wt Readings from Last 3 Encounters:  01/23/14 201 lb (91.173 kg)  02/25/13 199 lb (90.266 kg)  11/29/12 209 lb (94.802 kg)   Last menstrual period:  years ago Sexually active: yes, with husband only.  Contraception or hormonal therapy: Post-menopausal Hx of STD: No; Patient doesn't desire STD screening Dyspareunia: No Hot flashes: No Vaginal discharge: No Dysuria: No   Last mammogram: 2010 FH of breast, uterine, ovarian, colon cancer: No Breast mass or concerns: No Last pap smear: 2014, normal  History of abnormal pap smear: Not sure, doesn't think so.  Review of Systems:  Per HPI. All other systems reviewed and are negative.  Past medical, family and social histories as well as medications were reviewed and updated.  Objective:  BP 136/86 mmHg  Pulse 93  Temp(Src) 98.2 F (36.8 C) (Oral)  Ht 5\' 3"  (1.6 m)  Wt 201 lb (91.173 kg)  BMI 35.61 kg/m2  Gen: Pleasant 57 y.o. female in NAD HEENT: MMM, exophthalmos bilaterally. Conjunctival injection and muddy sclerae. EOMI, PERRL, nares patent, oropharynx clear, no thyromegaly CV: RRR, no MRG, no JVD, 2+ radial and DP pulses bilaterally Resp: Non-labored breathing ambient air, CTAB, no wheezes noted Breasts: Appear normal, no suspicious masses, no skin or  nipple changes or axillary nodes on palpation. MSK: No edema or myxedema. Full ROM. patellar and biceps DTRs 2+ Abd: Soft, NTND, BS present, no guarding or organomegaly; no palpable stool burden. Neuro: Alert and oriented, speech normal Assessment:  Tanya [name] is a 58 y.o. female here for annual gynecologic exam.  Plan:     Health maintenance:  - Pap smear: Due 2017 - Mammogram: Due now; ordered - Colonoscopy: Due now; ordered - Lipid screening: Indicated - DEXA scan not indicated - Flu shot today - Follow up in 1 year  Problem List Items Addressed This Visit      Cardiovascular and Mediastinum   HYPERTENSION, BENIGN ESSENTIAL - Primary    At goal, continue amlodipine. Pt believes chronic constipation began when chlorthalidone was started so will discontinue this and start ACE inhibitor. Checking labs. Reviewed low salt, high potassium diet as well as ways to incorporate walking into daily life.     Relevant Medications      lisinopril (PRINIVIL,ZESTRIL) tablet   Other Relevant Orders      CBC      Basic Metabolic Panel      Magnesium      Calcium     Other   Hyperlipidemia    10-year ASCVD risk 5.5%. Continue pravastatin, recheck labs.     Relevant Medications      lisinopril (PRINIVIL,ZESTRIL) tablet   Other Relevant Orders      Lipid Panel   Obesity    BMI 35.61. Discussed therapeutic lifestyle modifications as below. Will check Hb  A1c due to risk for insulin resistance.     Relevant Orders      HgB A1c   Anxiety state    Generalized, stable without agoraphobia or major limitations on lifestyle. Wants to know if natural supplement is ok for her to take, contains B6, B12, Ca, Mg. Will check electrolytes but I do not have a problem with taking this, though I discussed the fact that these supplements are not regulated by the FDA thus its contents are not guaranteed and that there are no randomized controlled clinical trials that I know of which show any of these  ingredients to be effective anxiolytics.     Exophthalmos    Without other signs/symptoms of thyroid disorder. Will check TSH.     Relevant Orders      TSH    Other Visit Diagnoses    Medication monitoring encounter        Relevant Orders       Magnesium       Calcium

## 2014-01-23 NOTE — Patient Instructions (Signed)
Take psyllium for constipation.  Your medications for anxiety and sleep are fine.  Get your colonoscopy and mammogram done. We have switched chlorthalidone to lisinopril.

## 2014-01-23 NOTE — Assessment & Plan Note (Signed)
At goal, continue amlodipine. Pt believes chronic constipation began when chlorthalidone was started so will discontinue this and start ACE inhibitor. Checking labs. Reviewed low salt, high potassium diet as well as ways to incorporate walking into daily life.

## 2014-01-23 NOTE — Assessment & Plan Note (Signed)
Without other signs/symptoms of thyroid disorder. Will check TSH.

## 2014-01-23 NOTE — Assessment & Plan Note (Signed)
Generalized, stable without agoraphobia or major limitations on lifestyle. Wants to know if natural supplement is ok for her to take, contains B6, B12, Ca, Mg. Will check electrolytes but I do not have a problem with taking this, though I discussed the fact that these supplements are not regulated by the FDA thus its contents are not guaranteed and that there are no randomized controlled clinical trials that I know of which show any of these ingredients to be effective anxiolytics.

## 2014-01-24 ENCOUNTER — Other Ambulatory Visit: Payer: Self-pay | Admitting: Family Medicine

## 2014-01-24 MED ORDER — POTASSIUM CHLORIDE CRYS ER 20 MEQ PO TBCR: 40.0000 meq | EXTENDED_RELEASE_TABLET | Freq: Every day | ORAL | Status: DC

## 2014-01-24 NOTE — Progress Notes (Signed)
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  Consulted by Bonnie Martinique for hypokalemia.  Will send in 40 mEq of Kdur for next 5 days.  Should return for recheck.  Will forward to PCP and clinic staff to call and inform patient.

## 2014-01-27 ENCOUNTER — Telehealth: Payer: Self-pay | Admitting: Family Medicine

## 2014-01-27 DIAGNOSIS — E876 Hypokalemia: Secondary | ICD-10-CM

## 2014-01-27 DIAGNOSIS — I10 Essential (primary) hypertension: Secondary | ICD-10-CM

## 2014-01-27 NOTE — Telephone Encounter (Signed)
-----   Message from Tanya [name] sent at 01/24/2014 11:01 AM EST ----- Regarding: low K Just want to make sure you see  K=2.9   Can't tell that you have reviewed this yet - let me know you have seen

## 2014-01-27 NOTE — Telephone Encounter (Signed)
Pt called and would like to speak to the nurse for Dr. Bonner Puna concerning a medication that was written. Patient didn't know what the name was but said she could explain it to the doctor. jw

## 2014-01-29 NOTE — Telephone Encounter (Signed)
Pt called stating that she has stop taking the Lisinopril due to having a previous cough with it.  She has started taking the chlorthalidone again even though it is caused constipation.  Pt is requesting a new medication to take.  Please advise.  Derl Barrow, RN

## 2014-01-31 NOTE — Telephone Encounter (Signed)
Patient calls, would like to speak with MD.

## 2014-02-03 ENCOUNTER — Other Ambulatory Visit: Payer: Self-pay | Admitting: Family Medicine

## 2014-02-03 DIAGNOSIS — E876 Hypokalemia: Secondary | ICD-10-CM | POA: Insufficient documentation

## 2014-02-03 MED ORDER — LOSARTAN POTASSIUM 50 MG PO TABS: 50.0000 mg | ORAL_TABLET | Freq: Every day | ORAL | Status: DC

## 2014-02-03 NOTE — Telephone Encounter (Deleted)
Continuation of previous note. We will continue amlodipine 10mg  and add losartan starting today. STOP chlorthalidone but continue potassium supplementation (which is temporary. This will not continue after 2 weeks due to risk of hyperkalemia with ARB) for 2 weeks and return for BP check and labs prior to leaving for [name] (trip from 12/11 to 12/30).

## 2014-02-03 NOTE — Telephone Encounter (Addendum)
Tanya [name] was seen by me on 01/27/2014 with complaints of constipation on chlorthalidone and continued HTN despite use of amlodipine 10mg  daily and lisinopril 20mg  daily. At that time I believe she denied any adverse reactions to any ACE inhibitor including lisinopril.   Continuation of previous note. We will continue amlodipine 10mg  and add losartan starting today. STOP chlorthalidone and discontinue potassium supplementation; will return for BP check and labs prior to leaving for [name] (trip from 12/11 to 12/30).   Gagan Dillion B. Bonner Puna, MD, PGY-2 02/03/2014 3:24 PM

## 2014-02-04 ENCOUNTER — Telehealth: Payer: Self-pay | Admitting: Family Medicine

## 2014-02-04 NOTE — Telephone Encounter (Signed)
Pt called and would like to speak to the nurse for Dr. Bonner Puna. 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 jw

## 2014-02-05 NOTE — Telephone Encounter (Signed)
No answer, no machine.  Will await callback. Majed Pellegrin, Salome Spotted

## 2014-02-11 ENCOUNTER — Ambulatory Visit
Admission: RE | Admit: 2014-02-11 | Discharge: 2014-02-11 | Disposition: A | Discharge status: Home / Self Care | Payer: No Typology Code available for payment source | Source: Ambulatory Visit

## 2014-02-11 DIAGNOSIS — Z1231 Encounter for screening mammogram for malignant neoplasm of breast: Secondary | ICD-10-CM

## 2014-02-11 NOTE — Telephone Encounter (Signed)
Pt called back again and is needing to talk to someone because she is going back to [place] and has some issues with her new medication and wants to go back to her old medication. Please call to discuss. jw

## 2014-02-14 NOTE — Telephone Encounter (Signed)
Ms. Hanigan need to speak with provider asap regarding the bp medication.  She want to take her old medication because she will be going to [place] next week and won't be back until on or after Jan 6th, therefore she will not be back in time for a 3 wk f/u after starting new medication.  Please call to advise and also she want to inquire about drug interactions of her medication if she takes the malaria medication required for going out of country.

## 2014-02-20 ENCOUNTER — Other Ambulatory Visit: Payer: Self-pay | Admitting: Family Medicine

## 2014-02-20 MED ORDER — CHLORTHALIDONE 25 MG PO TABS: 25.0000 mg | ORAL_TABLET | Freq: Every day | ORAL | Status: DC

## 2014-02-20 NOTE — Telephone Encounter (Signed)
I spoke with Ms. Nadeau once a again who reports that because [facility], the delay in receiving her new anti-HTN was too long to give her 2 full weeks of therapy prior to leaving for [name]. We would nee to know what the BP and labs looked like at that point so we agreed to hold on starting the norvasc, I reordered chlorthalidone to her local pharmacy and she will take that until she returns later this month.

## 2014-03-16 ENCOUNTER — Other Ambulatory Visit: Payer: Self-pay | Admitting: Family Medicine

## 2014-04-22 ENCOUNTER — Other Ambulatory Visit: Payer: Self-pay | Admitting: *Deleted

## 2014-04-22 DIAGNOSIS — I10 Essential (primary) hypertension: Secondary | ICD-10-CM

## 2014-04-23 MED ORDER — PRAVASTATIN SODIUM 40 MG PO TABS: 40.0000 mg | ORAL_TABLET | Freq: Every day | ORAL | Status: DC

## 2014-04-23 MED ORDER — LOSARTAN POTASSIUM 50 MG PO TABS: 50.0000 mg | ORAL_TABLET | Freq: Every day | ORAL | Status: DC

## 2014-04-25 ENCOUNTER — Other Ambulatory Visit: Payer: Self-pay | Admitting: *Deleted

## 2014-04-25 MED ORDER — AMLODIPINE BESYLATE 10 MG PO TABS: 10.0000 mg | ORAL_TABLET | Freq: Every day | ORAL | Status: DC

## 2014-04-25 NOTE — Telephone Encounter (Signed)
She needs to be seen for follow up of BP. Norvasc reordered. Can we call to have her schedule this? Thank you!

## 2014-04-25 NOTE — Telephone Encounter (Signed)
Appt 05/14/2014 for follow up.  Derl Barrow, RN

## 2014-05-14 ENCOUNTER — Encounter: Payer: Self-pay | Admitting: Family Medicine

## 2014-05-14 ENCOUNTER — Ambulatory Visit (INDEPENDENT_AMBULATORY_CARE_PROVIDER_SITE_OTHER): Payer: No Typology Code available for payment source | Admitting: Family Medicine

## 2014-05-14 VITALS — BP 131/85 | HR 78 | Temp 98.4°F | Ht 63.0 in | Wt 200.8 lb

## 2014-05-14 DIAGNOSIS — E876 Hypokalemia: Secondary | ICD-10-CM

## 2014-05-14 DIAGNOSIS — R739 Hyperglycemia, unspecified: Secondary | ICD-10-CM

## 2014-05-14 DIAGNOSIS — E785 Hyperlipidemia, unspecified: Secondary | ICD-10-CM

## 2014-05-14 DIAGNOSIS — Z0000 Encounter for general adult medical examination without abnormal findings: Secondary | ICD-10-CM

## 2014-05-14 DIAGNOSIS — J302 Other seasonal allergic rhinitis: Secondary | ICD-10-CM

## 2014-05-14 DIAGNOSIS — I10 Essential (primary) hypertension: Secondary | ICD-10-CM

## 2014-05-14 DIAGNOSIS — Z5181 Encounter for therapeutic drug level monitoring: Secondary | ICD-10-CM

## 2014-05-14 LAB — HIV ANTIBODY (ROUTINE TESTING W REFLEX): HIV 1&2 Ab, 4th Generation: NONREACTIVE

## 2014-05-14 LAB — BASIC METABOLIC PANEL
BUN: 11 mg/dL (ref 6–23)
CALCIUM: 9.6 mg/dL (ref 8.4–10.5)
CHLORIDE: 104 meq/L (ref 96–112)
CO2: 29 mEq/L (ref 19–32)
CREATININE: 0.64 mg/dL (ref 0.50–1.10)
GLUCOSE: 83 mg/dL (ref 70–99)
Potassium: 4 mEq/L (ref 3.5–5.3)
Sodium: 142 mEq/L (ref 135–145)

## 2014-05-14 LAB — POCT GLYCOSYLATED HEMOGLOBIN (HGB A1C): Hemoglobin A1C: 6.7

## 2014-05-14 MED ORDER — CETIRIZINE HCL 10 MG PO TABS: 10.0000 mg | ORAL_TABLET | Freq: Every day | ORAL | Status: DC

## 2014-05-14 NOTE — Patient Instructions (Addendum)
Keep taking amlodipine and losartan daily for blood pressure Take pravastatin daily for cholesterol Start taking zyrtec every morning for allergies I will call you with the results of your diabetes test. We're hoping for a number below 6.5% In the meantime: try to cut down on bread and rice. Substitute by eating a lot of vegetables and some fruit.  - High blood pressure is best treated by losing weight, taking medications as directed, avoiding salt in your diet and increasing potassium from fruits and vegetables (called the DASH diet). - If you feel faint, experience new/worsening chest pain or shortness of breath, or notice rapid leg swelling and/or weight gain you should call the clinic at [phone] OR go directly to the ER.  Basic Carbohydrate Counting Carbohydrate counting is a method for keeping track of the amount of carbohydrates you eat. Eating carbohydrates naturally increases the level of sugar (glucose) in your blood, so it is important for you to know the amount that is okay for you to have in every meal. Carbohydrate counting helps keep the level of glucose in your blood within normal limits. The amount of carbohydrates allowed is different for every person. A dietitian can help you calculate the amount that is right for you. Once you know the amount of carbohydrates you can have, you can count the carbohydrates in the foods you want to eat. Carbohydrates are found in the following foods:  Grains, such as breads and cereals.  Dried beans and soy products.  Starchy vegetables, such as potatoes, peas, and corn.  Fruit and fruit juices.  Milk and yogurt.  Sweets and snack foods, such as cake, cookies, candy, chips, soft drinks, and fruit drinks. CARBOHYDRATE COUNTING There are two ways to count the carbohydrates in your food. You can use either of the methods or a combination of both. Reading the "Nutrition Facts" on [place] The "Nutrition Facts" is an area that is included  on the labels of almost all packaged food and beverages in the [place]. It includes the serving size of that food or beverage and information about the nutrients in each serving of the food, including the grams (g) of carbohydrate per serving.  Decide the number of servings of this food or beverage that you will be able to eat or drink. Multiply that number of servings by the number of grams of carbohydrate that is listed on the label for that serving. The total will be the amount of carbohydrates you will be having when you eat or drink this food or beverage. Learning Standard Serving Sizes of Food When you eat food that is not packaged or does not include "Nutrition Facts" on the label, you need to measure the servings in order to count the amount of carbohydrates.A serving of most carbohydrate-rich foods contains about 15 g of carbohydrates. The following list includes serving sizes of carbohydrate-rich foods that provide 15 g ofcarbohydrate per serving:   1 slice of bread (1 oz) or 1 six-inch tortilla.    of a hamburger bun or English muffin.  4-6 crackers.   cup unsweetened dry cereal.    cup hot cereal.   cup rice or pasta.    cup mashed potatoes or  of a large baked potato.  1 cup fresh fruit or one small piece of fruit.    cup canned or frozen fruit or fruit juice.  1 cup milk.   cup plain fat-free yogurt or yogurt sweetened with artificial sweeteners.   cup cooked dried beans  or starchy vegetable, such as peas, corn, or potatoes.  Decide the number of standard-size servings that you will eat. Multiply that number of servings by 15 (the grams of carbohydrates in that serving). For example, if you eat 2 cups of strawberries, you will have eaten 2 servings and 30 g of carbohydrates (2 servings x 15 g = 30 g). For foods such as soups and casseroles, in which more than one food is mixed in, you will need to count the carbohydrates in each food that is  included. EXAMPLE OF CARBOHYDRATE COUNTING Sample Dinner  3 oz chicken breast.   cup of brown rice.   cup of corn.  1 cup milk.   1 cup strawberries with sugar-free whipped topping.  Carbohydrate Calculation Step 1: Identify the foods that contain carbohydrates:   Rice.   Corn.   Milk.   Strawberries. Step 2:Calculate the number of servings eaten of each:   2 servings of rice.   1 serving of corn.   1 serving of milk.   1 serving of strawberries. Step 3: Multiply each of those number of servings by 15 g:   2 servings of rice x 15 g = 30 g.   1 serving of corn x 15 g = 15 g.   1 serving of milk x 15 g = 15 g.   1 serving of strawberries x 15 g = 15 g. Step 4: Add together all of the amounts to find the total grams of carbohydrates eaten: 30 g + 15 g + 15 g + 15 g = 75 g. Document Released: 02/28/2005 Document Revised: 07/15/2013 Document Reviewed: 01/25/2013 [facility] Patient Information 2015 [place], [place]. This information is not intended to replace advice given to you by your health care provider. Make sure you discuss any questions you have with your health care provider.  DASH Eating Plan DASH stands for "Dietary Approaches to Stop Hypertension." The DASH eating plan is a healthy eating plan that has been shown to reduce high blood pressure (hypertension). Additional health benefits may include reducing the risk of type 2 diabetes mellitus, heart disease, and stroke. The DASH eating plan may also help with weight loss. WHAT DO I NEED TO KNOW ABOUT THE DASH EATING PLAN? For the DASH eating plan, you will follow these general guidelines:  Choose foods with a percent daily value for sodium of less than 5% (as listed on the food label).  Use salt-free seasonings or herbs instead of table salt or sea salt.  Check with your health care provider or pharmacist before using salt substitutes.  Eat lower-sodium products, often labeled as "lower sodium"  or "no salt added."  Eat fresh foods.  Eat more vegetables, fruits, and low-fat dairy products.  Choose whole grains. Look for the word "whole" as the first word in the ingredient list.  Choose fish and skinless chicken or [place] more often than red meat. Limit fish, poultry, and meat to 6 oz (170 g) each day.  Limit sweets, desserts, sugars, and sugary drinks.  Choose heart-healthy fats.  Limit cheese to 1 oz (28 g) per day.  Eat more home-cooked food and less restaurant, buffet, and fast food.  Limit fried foods.  Cook foods using methods other than frying.  Limit canned vegetables. If you do use them, rinse them well to decrease the sodium.  When eating at a restaurant, ask that your food be prepared with less salt, or no salt if possible. WHAT FOODS CAN I EAT? Seek  help from a dietitian for individual calorie needs. Grains Whole grain or whole wheat bread. Brown rice. Whole grain or whole wheat pasta. Quinoa, bulgur, and whole grain cereals. Low-sodium cereals. Corn or whole wheat flour tortillas. Whole grain cornbread. Whole grain crackers. Low-sodium crackers. Vegetables Fresh or frozen vegetables (raw, steamed, roasted, or grilled). Low-sodium or reduced-sodium tomato and vegetable juices. Low-sodium or reduced-sodium tomato sauce and paste. Low-sodium or reduced-sodium canned vegetables.  Fruits All fresh, canned (in natural juice), or frozen fruits. Meat and Other Protein Products Ground beef (85% or leaner), grass-fed beef, or beef trimmed of fat. Skinless chicken or [place]. Ground chicken or [place]. Pork trimmed of fat. All fish and seafood. Eggs. Dried beans, peas, or lentils. Unsalted nuts and seeds. Unsalted canned beans. Dairy Low-fat dairy products, such as skim or 1% milk, 2% or reduced-fat cheeses, low-fat ricotta or cottage cheese, or plain low-fat yogurt. Low-sodium or reduced-sodium cheeses. Fats and Oils Tub margarines without trans fats. Light or  reduced-fat mayonnaise and salad dressings (reduced sodium). Avocado. Safflower, olive, or canola oils. Natural peanut or almond butter. Other Unsalted popcorn and pretzels.  WHAT FOODS ARE NOT RECOMMENDED? Grains White bread. White pasta. White rice. Refined cornbread. Bagels and croissants. Crackers that contain trans fat. Vegetables Creamed or fried vegetables. Vegetables in a cheese sauce. Regular canned vegetables. Regular canned tomato sauce and paste. Regular tomato and vegetable juices. Fruits Dried fruits. Canned fruit in light or heavy syrup. Fruit juice. Meat and Other Protein Products Fatty cuts of meat. Ribs, chicken wings, bacon, sausage, bologna, salami, chitterlings, fatback, hot dogs, bratwurst, and packaged luncheon meats. Salted nuts and seeds. Canned beans with salt. Dairy Whole or 2% milk, cream, half-and-half, and cream cheese. Whole-fat or sweetened yogurt. Full-fat cheeses or blue cheese. Nondairy creamers and whipped toppings. Processed cheese, cheese spreads, or cheese curds. Condiments Onion and garlic salt, seasoned salt, table salt, and sea salt. Canned and packaged gravies. Worcestershire sauce. Tartar sauce. Barbecue sauce. Teriyaki sauce. Soy sauce, including reduced sodium. Steak sauce. Fish sauce. Oyster sauce. Cocktail sauce. Horseradish. Ketchup and mustard. Meat flavorings and tenderizers. Bouillon cubes. Hot sauce. Tabasco sauce. Marinades. Taco seasonings. Relishes. Fats and Oils Butter, stick margarine, lard, shortening, ghee, and bacon fat. Coconut, palm kernel, or palm oils. Regular salad dressings. Other Pickles and olives. Salted popcorn and pretzels. The items listed above may not be a complete list of foods and beverages to avoid. Contact your dietitian for more information. WHERE CAN I FIND MORE INFORMATION? National Heart, Lung, and Blood Institute: travelstabloid.com

## 2014-05-14 NOTE — Progress Notes (Signed)
Subjective: Tanya [name] is a 59 y.o. female here for hypertension follow up.   she daily checks BP at home (ranging in 110-120 / 70s). Misses 0 doses out of 7 days. She is not exercising, can walk about 1 mile before dyspnea onset, and is adherent to a low-salt diet. Denies side effects of medications. No constipation or vision changes.   Also concerned about red/watery eyes intermittently for the past several months.   - ROS: Denies CP, SOB, palpitations, syncope, dizziness, orthopnea, PND, frequent headaches, vision changes, claudication, leg swelling. - PMFSH: Nonsmoker, EtOH, nor illicit drugs. - Medications: reviewed and updated  Objective: BP 131/85 mmHg  Pulse 78  Temp(Src) 98.4 F (36.9 C) (Oral)  Ht 5\' 3"  (1.6 m)  Wt 200 lb 12.8 oz (91.082 kg)  BMI 35.58 kg/m2 Gen: Well-appearing 59 y.o.female in no distress Neck: brisk carotid upstroke, no bruits; thyroid not enlarged  Pulm: Non-labored breathing room air; CTAB CV: Regular rate with normal S1/S2, no murmur; no LE edema, no JVD; DP and radial pulses symmetric and 2+. Homan's sign absent. cap refill < 2 sec. GI: Nontender, nondistended, no HSM   Assessment & Plan: Tanya [name] is a 59 y.o. female here for hypertension and possible diabetes.   Last Hb A1c was very elevated and she has no history of diabetes, so will recheck to rule out spurious result. Dietary modifications discussed in detail today.  See problem list for problem-specific plans.

## 2014-05-16 ENCOUNTER — Telehealth: Payer: Self-pay | Admitting: Family Medicine

## 2014-05-16 NOTE — Telephone Encounter (Signed)
Pt's A1c still > 6.5%. Will need to start metformin in addition to lifestyle therapies.

## 2014-05-16 NOTE — Telephone Encounter (Signed)
Pt called and wanted to speak to the doctor about her A1C and what is next . jw

## 2014-05-19 NOTE — Telephone Encounter (Signed)
Pt called and is check  The status of when the doctor is going to call her. jw

## 2014-05-21 NOTE — Telephone Encounter (Signed)
I called and spoke with Ms. Westerfeld regarding her last lab tests: normal renal fxn, neg HIV screen, and A1c slightly elevated at 6.7% though greatly improved from previous. She elects to continue dietary modifications to treat elevated blood sugar and is very motivated, so we will recheck after that intervention has been in place for ~3 months.

## 2014-06-16 ENCOUNTER — Encounter: Payer: Self-pay | Admitting: Family Medicine

## 2014-06-16 ENCOUNTER — Ambulatory Visit (INDEPENDENT_AMBULATORY_CARE_PROVIDER_SITE_OTHER): Payer: No Typology Code available for payment source | Admitting: Family Medicine

## 2014-06-16 VITALS — BP 152/89 | HR 86 | Temp 98.6°F | Ht 63.0 in | Wt 193.7 lb

## 2014-06-16 DIAGNOSIS — E669 Obesity, unspecified: Secondary | ICD-10-CM | POA: Diagnosis not present

## 2014-06-16 DIAGNOSIS — R011 Cardiac murmur, unspecified: Secondary | ICD-10-CM | POA: Diagnosis not present

## 2014-06-16 DIAGNOSIS — I10 Essential (primary) hypertension: Secondary | ICD-10-CM | POA: Diagnosis not present

## 2014-06-16 DIAGNOSIS — H538 Other visual disturbances: Secondary | ICD-10-CM | POA: Insufficient documentation

## 2014-06-16 DIAGNOSIS — R7309 Other abnormal glucose: Secondary | ICD-10-CM

## 2014-06-16 DIAGNOSIS — E119 Type 2 diabetes mellitus without complications: Secondary | ICD-10-CM | POA: Insufficient documentation

## 2014-06-16 DIAGNOSIS — R7303 Prediabetes: Secondary | ICD-10-CM

## 2014-06-16 NOTE — Assessment & Plan Note (Addendum)
With evidence of mild LVH and grade 1 diastolic dysfunction on echo 2014. Asymptomatically elevated today due to anxiety and pt not taking medications as directed (every other day), though provides BP cuff with readings all at goal - suspect white coat HTN component given her anxious disposition.  - Decrease doses of amlodipine and cozaar by half: To take amlodipine 5mg  daily and losartan 25mg  daily (1/2 tablets of current prescription) and will call if any abnormal BP's are recorded.  - Follow up June 1 for A1c recheck.

## 2014-06-16 NOTE — Progress Notes (Signed)
Subjective: Tanya [name] is a 59 y.o. female here for diabetes follow up.   - She has questions about blood pressure. She took her BP at home and it was in the 90s over 60s, due to this she has been taking amlodipine and cozaar every other day and provides records of home BP's on the BP cuff ALL < 140 SBP and < 90 DBP.   - She recently had elevated Hb A1c diagnostic of diabetes. Takes no medications and does not check her sugars. She reports great improvements in diet and some increase in activity (but not much) which have resulted in 7 lbs weight loss over past 1 month. She does NOT want to start metformin at this time.   - Reports blurry vision for many years which gets better and worse day to day but is otherwise stable.  - ROS: Denies fever, chills, weight loss, dizziness, syncope, polyuria, nocturia, paresthesias, polydipsia, chest pain, new wounds.  All other systems reviewed and are negative. - Vader: non-smoker, no EtOH, no illicit drugs. - Medications: reviewed and updated  Objective: BP 152/89 mmHg  Pulse 86  Temp(Src) 98.6 F (37 C) (Oral)  Ht 5\' 3"  (1.6 m)  Wt 193 lb 11.2 oz (87.862 kg)  BMI 34.32 kg/m2 Gen: Well-appearing 59 y.o.female in no distress HEENT: Normocephalic, sclerae/conjunctivae clear, PERRL, MMM, posterior oropharynx clear, fair-to-good dentition Neck: Neck supple, no masses or lymphadenopathy; thyroid not enlarged  Pulm: Non-labored; CTAB, no wheezes  CV: Regular rate, no murmur appreciated; no LE edema, no JVD GI: Normoactive BS; soft, non-tender, non-distended, no HSM Skin: No wounds or rashes, no acanthosis nigricans See diabetic foot exam - simple  Neuro: CN II-XII without deficits, sensation intact to light touch, steady gait.  Assessment & Plan: Tanya [name] is a 59 y.o. female here for elevated Hb A1c (6.7%).    See problem list for problem-specific plans.

## 2014-06-16 NOTE — Assessment & Plan Note (Signed)
Recommended she be evaluated by optometrist.

## 2014-06-16 NOTE — Assessment & Plan Note (Signed)
Stable

## 2014-06-16 NOTE — Patient Instructions (Signed)
It was great to see you today!  - For blood pressure: lower your dose of both amlodipine and cozaar by half. Take half a tablet every day of each.  - For diabetes, try to cut down on potatoes and rice and other sugars. Start an exercise plan. These things will probably cure you of diabetes at this point.  - Follow up in 2 months, around June 1st.  Call [phone] if you have any issues before then.

## 2014-06-16 NOTE — Assessment & Plan Note (Signed)
Improving - pt quite motivated due to recent Dx DM and has lost 7 lbs in past month. Continued to discuss dietary approaches to diabetes (carb and sugar minimizing) and she is planning on incorporating exercise into every day.

## 2014-06-16 NOTE — Assessment & Plan Note (Signed)
Lab Results  Component Value Date   HGBA1C 6.7 05/14/2014  Suspect insulin resistance due to obesity and have counseled pt extensively regarding importance of weight loss for this and HTN, which she is motivated to improve. Per shared decision making with her, we have decided to hold off on metformin at this time to recheck A1c in 2 months (as A1c was previously 8.7%, I wonder if she will not continue to downtrend such that she is not truly diabetic).

## 2014-07-18 ENCOUNTER — Other Ambulatory Visit: Payer: Self-pay | Admitting: *Deleted

## 2014-07-19 MED ORDER — AMLODIPINE BESYLATE 10 MG PO TABS: 10.0000 mg | ORAL_TABLET | Freq: Every day | ORAL | Status: DC

## 2014-12-03 ENCOUNTER — Ambulatory Visit (INDEPENDENT_AMBULATORY_CARE_PROVIDER_SITE_OTHER): Payer: No Typology Code available for payment source | Admitting: Family Medicine

## 2014-12-03 VITALS — BP 129/96 | HR 86 | Temp 98.6°F | Ht 63.0 in | Wt 195.0 lb

## 2014-12-03 DIAGNOSIS — I10 Essential (primary) hypertension: Secondary | ICD-10-CM

## 2014-12-03 DIAGNOSIS — R7309 Other abnormal glucose: Secondary | ICD-10-CM

## 2014-12-03 LAB — POCT GLYCOSYLATED HEMOGLOBIN (HGB A1C): HEMOGLOBIN A1C: 6.1

## 2014-12-03 NOTE — Progress Notes (Signed)
Subjective: Tanya [name] is a 60 y.o. female here for hypertension follow up.   She does not check BP at home. Taking only norvasc 10mg , she misses 0 doses out of 7 days. is exercising and is adherent to a low-salt diet. she is able to walk indefinitely without dyspnea.   - ROS: Denies CP, SOB, palpitations, syncope, dizziness, orthopnea, PND, frequent headaches, vision changes, claudication, leg swelling. - PMFSH: nonsmoker, no EtOH, no illicit drugs. - Medications: reviewed and updated  Objective: BP 152/93 mmHg  Pulse 86  Temp(Src) 98.6 F (37 C) (Oral)  Ht 5\' 3"  (1.6 m)  Wt 195 lb (88.451 kg)  BMI 34.55 kg/m2 Gen: Well-appearing 59 y.o.female in no distress Neck: brisk carotid upstroke, no bruits; thyroid not enlarged  Pulm: Non-labored breathing room air; CTAB CV: Regular rate with normal S1/S2, no murmur; trace LE edema, no JVD; DP and radial pulses symmetric and 2+. Homan's sign absent. cap refill < 2 sec.  GI: Nontender, nondistended, no HSM     Chemistry      Component Value Date/Time   NA 142 05/14/2014 1419   K 4.0 05/14/2014 1419   CL 104 05/14/2014 1419   CO2 29 05/14/2014 1419   BUN 11 05/14/2014 1419   CREATININE 0.64 05/14/2014 1419   CREATININE 0.77 03/02/2010 2038      Component Value Date/Time   CALCIUM 9.6 05/14/2014 1419   ALKPHOS 66 11/29/2012 1015   AST 13 11/29/2012 1015   ALT 13 11/29/2012 1015   BILITOT 0.5 11/29/2012 1015      Lab Results  Component Value Date   WBC 7.6 01/23/2014   HGB 13.5 01/23/2014   HCT 39.9 01/23/2014   MCV 73.8* 01/23/2014   PLT 243 01/23/2014   Lab Results  Component Value Date   TSH 1.295 01/23/2014   Lab Results  Component Value Date   HGBA1C 6.1 12/03/2014   Assessment & Plan: Tanya [name] is a 59 y.o. female here for hypertension.

## 2014-12-03 NOTE — Patient Instructions (Addendum)
Thank you for coming in today!  - Take losartan 50mg  half of a tablet once a day every day. Call the office at [phone] with the average blood pressure reading and we will go from there. If you have lows before then call the clinic and we can discuss what to do.  - Stop taking amlodipine for now.   Our clinic's number is [phone]. Feel free to call any time with questions or concerns. We will answer any questions after hours with our 24-hour emergency line at that number as well.   - Dr. Bonner Puna

## 2014-12-12 NOTE — Assessment & Plan Note (Signed)
Suboptimal control due to insufficient treatment, will restart loartan and hold amlodipine as she believes this is worsening her swelling.  - Check renal function at follow up - Primary elements of DASH diet and weight loss strategies reviewed in detail. Handout given.  - Because it's difficult for her to appear for appointments, she will monitor home BP daily and call with that record in 2 weeks.

## 2015-01-08 ENCOUNTER — Other Ambulatory Visit: Payer: Self-pay | Admitting: *Deleted

## 2015-01-09 MED ORDER — AMLODIPINE BESYLATE 10 MG PO TABS: 10.0000 mg | ORAL_TABLET | Freq: Every day | ORAL | Status: DC

## 2015-01-13 ENCOUNTER — Telehealth: Payer: Self-pay | Admitting: Family Medicine

## 2015-01-13 NOTE — Telephone Encounter (Signed)
Still having problems with blood pressure especially the systolic.  Today it was 136/104, 130/90 Please advise

## 2015-01-13 NOTE — Telephone Encounter (Signed)
Left message for patient to give a call back.  If she is having concerns with her blood pressure she should follow up with PCP.  Unclear is patient is having any other symptoms.  The top number is not concerned that high; however the bottom is high.  Will forward to PCP for further advise.

## 2015-01-15 NOTE — Telephone Encounter (Signed)
Please schedule a BP follow visit with me. Thank you!

## 2015-01-15 NOTE — Telephone Encounter (Signed)
LVM for pt to call the office. Please schedule appt with PCP per below note. Ottis Stain, CMA

## 2015-01-16 NOTE — Telephone Encounter (Signed)
appt scheduled. [name], Tanya Spotted

## 2015-01-19 ENCOUNTER — Ambulatory Visit: Payer: No Typology Code available for payment source | Admitting: Family Medicine

## 2015-03-26 ENCOUNTER — Other Ambulatory Visit: Payer: Self-pay | Admitting: Family Medicine

## 2015-04-07 ENCOUNTER — Other Ambulatory Visit: Payer: Self-pay | Admitting: Family Medicine

## 2015-05-25 ENCOUNTER — Other Ambulatory Visit: Payer: Self-pay | Admitting: *Deleted

## 2015-05-25 MED ORDER — PRAVASTATIN SODIUM 40 MG PO TABS: 40.0000 mg | ORAL_TABLET | Freq: Every day | ORAL | Status: DC

## 2015-05-25 MED ORDER — AMLODIPINE BESYLATE 10 MG PO TABS: 10.0000 mg | ORAL_TABLET | Freq: Every day | ORAL | Status: DC

## 2015-05-25 MED ORDER — LOSARTAN POTASSIUM 50 MG PO TABS: 50.0000 mg | ORAL_TABLET | Freq: Every day | ORAL | Status: DC

## 2015-10-13 ENCOUNTER — Other Ambulatory Visit: Payer: Self-pay | Admitting: *Deleted

## 2015-10-13 MED ORDER — AMLODIPINE BESYLATE 10 MG PO TABS: 10.0000 mg | ORAL_TABLET | Freq: Every day | ORAL | 0 refills | Status: DC

## 2016-01-25 NOTE — Progress Notes (Signed)
Everrett Coombe, MD, MS Phone: [phone]  Subjective:  CC -- Annual Physical; With complaints of difficulties sleeping. Pt reports she would like to catch up on all of her health maintenance.   Cardiovascular: - Dx Hypertension: yes  - Dx Hyperlipidemia: yes  - Dx Obesity: yes  - Physical Activity: no  - Diabetes: no, previous HbA1c was 6.1 one year ago  Cancer: Colorectal >> No history of colorectal cancer in the family.  Lung >> Tobacco Use: no  Breast >> Mammogram: previous normal exam two years ago, due for mammogram.  Cervical/Endometrial >>  - Postmenopausal: yes menopause >10 years ago  - Vaginal Bleeding: no - Pap Smear: yes   - Previous Abnormal Pap: no Skin >> Suspicious lesions: no   Social: Alcohol Use: no  Tobacco Use: no  Other Drugs: no  Risky Sexual Behavior: No. Sexually active with one female (husband)  Depression: no, never has had feelings of depression Support and Life at Home: yes. Cites her husband as a support. Works as a Pharmacist, hospital.  Other: Osteoporosis: no Zoster Vaccine: no Flu Vaccine: yes    ROS-  Denies changes in vision, rhinorrhea or congestion, no dyspnea, no chest pain, no N/V/D/C  Past Medical History Patient Active Problem List   Diagnosis Date Noted  . Encounter for preventative adult health care examination 01/26/2016  . Elevated hemoglobin A1c 06/16/2014  . Blurry vision, bilateral 06/16/2014  . Seasonal allergies 05/14/2014  . Murmur 02/25/2013  . Dyspnea on exertion 11/29/2012  . Hyperlipidemia 02/10/2009  . Obesity 02/10/2009  . Anxiety state 02/10/2009  . HYPERTENSION, BENIGN ESSENTIAL 06/01/2006    Medications- reviewed and updated Current Outpatient Prescriptions  Medication Sig Dispense Refill  . losartan (COZAAR) 50 MG tablet Take 1 tablet (50 mg total) by mouth daily. 90 tablet 2  . pravastatin (PRAVACHOL) 40 MG tablet Take 1 tablet (40 mg total) by mouth at bedtime. 90 tablet 2   No current  facility-administered medications for this visit.     Objective: BP (!) 142/90   Pulse 86   Temp 98 F (36.7 C) (Oral)   Ht 5\' 3"  (1.6 m)   Wt 92.1 kg (203 lb)   SpO2 99%   BMI 35.96 kg/m  Gen: NAD, alert, cooperative with exam HEENT: NCAT, EOMI, PERRL CV: RRR, good [id number], +systolic murmur Resp: CTABL, no wheezes, non-labored Abd: Soft, Non Tender, Non Distended, BS present, no guarding or organomegaly Genital Exam: not done Vagina: not indicated and normal appearing vagina with normal color and discharge, no lesions Ext: No edema, warm Neuro: Alert and oriented, No gross deficits   Assessment/Plan:  Hyperlipidemia - currently takes pravastatin daily - previous lipid panel favorable in 01/2014: Chol 137, Triglycerides 135, HDL 49, LDL 61 - will do lipid panel today  HYPERTENSION, BENIGN ESSENTIAL Controlled this visit at 142/90 on losartan. - previously also on norvasc which patient indicates was stopped at a previous visit, this was updated in her chart - BM[facility] ordered this visit   Elevated hemoglobin A1c HgbA1c 6.1 one year ago. - will repeat today - patient on no diabetes medications  Encounter for preventative adult health care examination - PAP this visit - referral for mammogram - referral for colonoscopy - Hep C screen done this visit   Orders Placed This Encounter  Procedures  . MM Digital Screening    Standing Status:   Future    Standing Expiration Date:   03/27/2017    Order Specific Question:  Reason for Exam (SYMPTOM  OR DIAGNOSIS REQUIRED)    Answer:   screening mammogram    Order Specific Question:   Is the patient pregnant?    Answer:   No    Order Specific Question:   Preferred imaging location?    Answer:   [facility]  . Flu Vaccine QUAD 36+ mos IM  . Lipid panel  . Hemoglobin A1C  . Basic Metabolic Panel  . Hepatitis C antibody    No orders of the defined types were placed in this encounter.    Everrett Coombe, MD,MS,   PGY1 01/26/2016 5:21 PM

## 2016-01-26 ENCOUNTER — Ambulatory Visit (INDEPENDENT_AMBULATORY_CARE_PROVIDER_SITE_OTHER): Payer: BLUE CROSS/BLUE SHIELD | Admitting: Student in an Organized Health Care Education/Training Program

## 2016-01-26 ENCOUNTER — Encounter: Payer: Self-pay | Admitting: Student in an Organized Health Care Education/Training Program

## 2016-01-26 ENCOUNTER — Other Ambulatory Visit (HOSPITAL_COMMUNITY)
Admission: RE | Admit: 2016-01-26 | Discharge: 2016-01-26 | Disposition: A | Discharge status: Home / Self Care | Payer: BLUE CROSS/BLUE SHIELD | Source: Ambulatory Visit | Attending: Family Medicine | Admitting: Family Medicine

## 2016-01-26 VITALS — BP 142/90 | HR 86 | Temp 98.0°F | Ht 63.0 in | Wt 203.0 lb

## 2016-01-26 DIAGNOSIS — Z1151 Encounter for screening for human papillomavirus (HPV): Secondary | ICD-10-CM | POA: Insufficient documentation

## 2016-01-26 DIAGNOSIS — E782 Mixed hyperlipidemia: Secondary | ICD-10-CM

## 2016-01-26 DIAGNOSIS — Z124 Encounter for screening for malignant neoplasm of cervix: Secondary | ICD-10-CM

## 2016-01-26 DIAGNOSIS — Z23 Encounter for immunization: Secondary | ICD-10-CM | POA: Diagnosis not present

## 2016-01-26 DIAGNOSIS — Z1159 Encounter for screening for other viral diseases: Secondary | ICD-10-CM

## 2016-01-26 DIAGNOSIS — I10 Essential (primary) hypertension: Secondary | ICD-10-CM

## 2016-01-26 DIAGNOSIS — Z1231 Encounter for screening mammogram for malignant neoplasm of breast: Secondary | ICD-10-CM

## 2016-01-26 DIAGNOSIS — Z01419 Encounter for gynecological examination (general) (routine) without abnormal findings: Secondary | ICD-10-CM | POA: Insufficient documentation

## 2016-01-26 DIAGNOSIS — Z0000 Encounter for general adult medical examination without abnormal findings: Secondary | ICD-10-CM

## 2016-01-26 DIAGNOSIS — Z1239 Encounter for other screening for malignant neoplasm of breast: Secondary | ICD-10-CM

## 2016-01-26 DIAGNOSIS — R7309 Other abnormal glucose: Secondary | ICD-10-CM

## 2016-01-26 LAB — BASIC METABOLIC PANEL
BUN: 12 mg/dL (ref 7–25)
CO2: 25 mmol/L (ref 20–31)
Calcium: 9.4 mg/dL (ref 8.6–10.4)
Chloride: 102 mmol/L (ref 98–110)
Creat: 0.87 mg/dL (ref 0.50–1.05)
Glucose, Bld: 83 mg/dL (ref 65–99)
POTASSIUM: 4.4 mmol/L (ref 3.5–5.3)
SODIUM: 139 mmol/L (ref 135–146)

## 2016-01-26 LAB — LIPID PANEL
CHOL/HDL RATIO: 3 ratio (ref <5.0)
Cholesterol: 173 mg/dL (ref <200)
HDL: 57 mg/dL (ref >50)
LDL CALC: 88 mg/dL (ref <100)
Triglycerides: 142 mg/dL (ref <150)
VLDL: 28 mg/dL (ref <30)

## 2016-01-26 NOTE — Patient Instructions (Signed)
It was a pleasure seeing you today in our clinic. Today we discussed healthcare maintenance. Here is the treatment plan we have discussed and agreed upon together:  - we did basic labs today to test your lipids, your blood sugar, and the electrolytes in your blood - I will send you a letter with the results of these tests - please follow up with me to discuss sleep difficulties. In the meantime you can try using melatonin at night to help with sleep.  Our clinic's number is [phone]. Please call with questions or concerns about what we discussed today.  Be well, Dr. Burr Medico

## 2016-01-26 NOTE — Assessment & Plan Note (Signed)
HgbA1c 6.1 one year ago. - will repeat today - patient on no diabetes medications

## 2016-01-26 NOTE — Assessment & Plan Note (Addendum)
Controlled this visit at 142/90 on losartan. - previously also on norvasc which patient indicates was stopped at a previous visit, this was updated in her chart - BMET ordered this visit

## 2016-01-26 NOTE — Assessment & Plan Note (Signed)
-   PAP this visit - referral for mammogram - referral for colonoscopy - Hep C screen done this visit

## 2016-01-26 NOTE — Assessment & Plan Note (Signed)
-   currently takes pravastatin daily - previous lipid panel favorable in 01/2014: Chol 137, Triglycerides 135, HDL 49, LDL 61 - will do lipid panel today

## 2016-01-27 LAB — HEPATITIS C ANTIBODY: HCV AB: NEGATIVE

## 2016-01-28 LAB — CYTOLOGY - PAP
Diagnosis: NEGATIVE
HPV: NOT DETECTED

## 2016-01-28 NOTE — Addendum Note (Signed)
Addended by: Boris Sharper on: 01/28/2016 02:34 PM   Modules accepted: Orders

## 2016-02-01 LAB — POCT GLYCOSYLATED HEMOGLOBIN (HGB A1C): Hemoglobin A1C: 6.6

## 2016-02-01 NOTE — Addendum Note (Signed)
Addended by: Boris Sharper on: 02/01/2016 02:17 PM   Modules accepted: Orders

## 2016-02-09 ENCOUNTER — Encounter: Payer: Self-pay | Admitting: Student in an Organized Health Care Education/Training Program

## 2016-04-14 ENCOUNTER — Telehealth: Payer: Self-pay | Admitting: *Deleted

## 2016-04-14 NOTE — Telephone Encounter (Signed)
LM  For patient to call back.  Please assist her in making an appointment to follow up on an elevated glucose reading.  Will forward to RN. Deborh Pense,CMA

## 2016-04-14 NOTE — Telephone Encounter (Signed)
-----   Message from Everrett Coombe, MD sent at 04/14/2016  1:53 PM EST ----- Regarding: RE: A1C Hi Jazmin,  Thank you for your note. Do you mind calling and asking the patient to schedule an appointment with me?   Thanks, Lauren  ----- Message ----- From: Valerie Roys, CMA Sent: 04/14/2016  10:18 AM To: Everrett Coombe, MD Subject: A1C                                            I am going through a list of our patients who have A1cs in diabetic range but may or may not have it on their problem list. Patient was last seen on 01/26/16 and had an A1C of 6.6  We are unsure if she is aware of this due to no documentation.  Feel free to utilize Lauren for diabetic education and health coaching once you have spoken with patient about this or Lauren and I can have patient make an appointment to discuss this with you.  Just let us know which way you decide. Jazmin Hartsell,CMA

## 2016-04-18 ENCOUNTER — Encounter: Payer: Self-pay | Admitting: *Deleted

## 2016-04-18 NOTE — Telephone Encounter (Signed)
LM for patient to call back and schedule an appointment.  Will mail her a letter. Jazmin Hartsell,CMA

## 2016-04-19 ENCOUNTER — Ambulatory Visit
Admission: RE | Admit: 2016-04-19 | Discharge: 2016-04-19 | Disposition: A | Discharge status: Home / Self Care | Payer: BLUE CROSS/BLUE SHIELD | Source: Ambulatory Visit | Attending: Family Medicine | Admitting: Family Medicine

## 2016-04-19 DIAGNOSIS — Z1231 Encounter for screening mammogram for malignant neoplasm of breast: Secondary | ICD-10-CM | POA: Diagnosis not present

## 2016-04-19 DIAGNOSIS — Z1239 Encounter for other screening for malignant neoplasm of breast: Secondary | ICD-10-CM

## 2016-04-19 DIAGNOSIS — Z0000 Encounter for general adult medical examination without abnormal findings: Secondary | ICD-10-CM

## 2016-04-21 ENCOUNTER — Other Ambulatory Visit: Payer: Self-pay | Admitting: Family Medicine

## 2016-04-21 DIAGNOSIS — R928 Other abnormal and inconclusive findings on diagnostic imaging of breast: Secondary | ICD-10-CM

## 2016-04-22 ENCOUNTER — Other Ambulatory Visit: Payer: Self-pay | Admitting: Student in an Organized Health Care Education/Training Program

## 2016-04-22 MED ORDER — LOSARTAN POTASSIUM 50 MG PO TABS: 50.0000 mg | ORAL_TABLET | Freq: Every day | ORAL | 2 refills | Status: DC

## 2016-04-22 MED ORDER — PRAVASTATIN SODIUM 40 MG PO TABS: 40.0000 mg | ORAL_TABLET | Freq: Every day | ORAL | 2 refills | Status: DC

## 2016-04-22 NOTE — Telephone Encounter (Signed)
Needs refill on losartan and pravastatin.  [facility] [address]

## 2016-04-28 ENCOUNTER — Ambulatory Visit
Admission: RE | Admit: 2016-04-28 | Discharge: 2016-04-28 | Disposition: A | Discharge status: Home / Self Care | Payer: BLUE CROSS/BLUE SHIELD | Source: Ambulatory Visit | Attending: Family Medicine | Admitting: Family Medicine

## 2016-04-28 ENCOUNTER — Other Ambulatory Visit: Payer: Self-pay | Admitting: Family Medicine

## 2016-04-28 DIAGNOSIS — N6321 Unspecified lump in the left breast, upper outer quadrant: Secondary | ICD-10-CM | POA: Diagnosis not present

## 2016-04-28 DIAGNOSIS — R928 Other abnormal and inconclusive findings on diagnostic imaging of breast: Secondary | ICD-10-CM

## 2016-04-28 DIAGNOSIS — R2232 Localized swelling, mass and lump, left upper limb: Secondary | ICD-10-CM

## 2016-04-28 DIAGNOSIS — N6320 Unspecified lump in the left breast, unspecified quadrant: Secondary | ICD-10-CM

## 2016-04-29 ENCOUNTER — Ambulatory Visit
Admission: RE | Admit: 2016-04-29 | Discharge: 2016-04-29 | Disposition: A | Discharge status: Home / Self Care | Payer: BLUE CROSS/BLUE SHIELD | Source: Ambulatory Visit | Attending: Family Medicine | Admitting: Family Medicine

## 2016-04-29 ENCOUNTER — Other Ambulatory Visit: Payer: Self-pay | Admitting: Family Medicine

## 2016-04-29 ENCOUNTER — Other Ambulatory Visit: Payer: Self-pay

## 2016-04-29 ENCOUNTER — Telehealth: Payer: Self-pay | Admitting: *Deleted

## 2016-04-29 ENCOUNTER — Other Ambulatory Visit: Payer: Self-pay | Admitting: Student in an Organized Health Care Education/Training Program

## 2016-04-29 DIAGNOSIS — R928 Other abnormal and inconclusive findings on diagnostic imaging of breast: Secondary | ICD-10-CM

## 2016-04-29 DIAGNOSIS — R2232 Localized swelling, mass and lump, left upper limb: Secondary | ICD-10-CM

## 2016-04-29 DIAGNOSIS — R590 Localized enlarged lymph nodes: Secondary | ICD-10-CM | POA: Diagnosis not present

## 2016-04-29 DIAGNOSIS — C773 Secondary and unspecified malignant neoplasm of axilla and upper limb lymph nodes: Secondary | ICD-10-CM | POA: Diagnosis not present

## 2016-04-29 DIAGNOSIS — N6321 Unspecified lump in the left breast, upper outer quadrant: Secondary | ICD-10-CM | POA: Diagnosis not present

## 2016-04-29 DIAGNOSIS — N6320 Unspecified lump in the left breast, unspecified quadrant: Secondary | ICD-10-CM

## 2016-04-29 DIAGNOSIS — C50412 Malignant neoplasm of upper-outer quadrant of left female breast: Secondary | ICD-10-CM | POA: Diagnosis not present

## 2016-04-29 HISTORY — PX: BREAST BIOPSY: SHX20

## 2016-04-29 NOTE — Telephone Encounter (Signed)
[facility] called and said that they had orders that need to be signed for this pt. [facility] stated that they had sent messages to PCP for this.  It is the mammogram and [place] from 2/15 as well as the 2 [place] Bx on 2/16. Routing to PCP so she can get these signed.  Katharina Caper, April D, [place]

## 2016-05-04 ENCOUNTER — Encounter: Payer: Self-pay | Admitting: *Deleted

## 2016-05-04 ENCOUNTER — Telehealth: Payer: Self-pay | Admitting: *Deleted

## 2016-05-04 DIAGNOSIS — Z170 Estrogen receptor positive status [ER+]: Secondary | ICD-10-CM | POA: Insufficient documentation

## 2016-05-04 DIAGNOSIS — C50412 Malignant neoplasm of upper-outer quadrant of left female breast: Secondary | ICD-10-CM

## 2016-05-04 HISTORY — DX: Estrogen receptor positive status (ER+): C50.412

## 2016-05-04 HISTORY — DX: Estrogen receptor positive status (ER+): Z17.0

## 2016-05-04 NOTE — Telephone Encounter (Signed)
Mailed [facility] packet to pt. 

## 2016-05-04 NOTE — Telephone Encounter (Signed)
Confirmed [facility] for 05/11/16 at 1215 .  Instructions and contact information given.

## 2016-05-11 ENCOUNTER — Encounter: Payer: Self-pay | Admitting: Genetic Counselor

## 2016-05-11 ENCOUNTER — Ambulatory Visit
Admission: RE | Admit: 2016-05-11 | Discharge: 2016-05-11 | Disposition: A | Discharge status: Home / Self Care | Payer: BLUE CROSS/BLUE SHIELD | Source: Ambulatory Visit | Attending: Radiation Oncology | Admitting: Radiation Oncology

## 2016-05-11 ENCOUNTER — Encounter: Payer: Self-pay | Admitting: Physical Therapy

## 2016-05-11 ENCOUNTER — Ambulatory Visit (HOSPITAL_BASED_OUTPATIENT_CLINIC_OR_DEPARTMENT_OTHER): Payer: BLUE CROSS/BLUE SHIELD | Admitting: Oncology

## 2016-05-11 ENCOUNTER — Other Ambulatory Visit (HOSPITAL_BASED_OUTPATIENT_CLINIC_OR_DEPARTMENT_OTHER): Payer: BLUE CROSS/BLUE SHIELD

## 2016-05-11 ENCOUNTER — Ambulatory Visit: Payer: BLUE CROSS/BLUE SHIELD | Attending: General Surgery | Admitting: Physical Therapy

## 2016-05-11 ENCOUNTER — Telehealth: Payer: Self-pay | Admitting: *Deleted

## 2016-05-11 ENCOUNTER — Other Ambulatory Visit: Payer: Self-pay | Admitting: *Deleted

## 2016-05-11 VITALS — BP 183/94 | HR 101 | Temp 98.3°F | Resp 18 | Ht 63.0 in | Wt 197.9 lb

## 2016-05-11 VITALS — BP 135/100

## 2016-05-11 DIAGNOSIS — Z170 Estrogen receptor positive status [ER+]: Secondary | ICD-10-CM

## 2016-05-11 DIAGNOSIS — C50412 Malignant neoplasm of upper-outer quadrant of left female breast: Secondary | ICD-10-CM

## 2016-05-11 DIAGNOSIS — R293 Abnormal posture: Secondary | ICD-10-CM

## 2016-05-11 DIAGNOSIS — C773 Secondary and unspecified malignant neoplasm of axilla and upper limb lymph nodes: Secondary | ICD-10-CM | POA: Diagnosis not present

## 2016-05-11 LAB — COMPREHENSIVE METABOLIC PANEL
ALT: 16 U/L (ref 0–55)
ANION GAP: 10 meq/L (ref 3–11)
AST: 15 U/L (ref 5–34)
Albumin: 4 g/dL (ref 3.5–5.0)
Alkaline Phosphatase: 74 U/L (ref 40–150)
BILIRUBIN TOTAL: 0.53 mg/dL (ref 0.20–1.20)
BUN: 7.7 mg/dL (ref 7.0–26.0)
CALCIUM: 9.6 mg/dL (ref 8.4–10.4)
CHLORIDE: 104 meq/L (ref 98–109)
CO2: 26 meq/L (ref 22–29)
Creatinine: 0.9 mg/dL (ref 0.6–1.1)
EGFR: 78 mL/min/{1.73_m2} — ABNORMAL LOW (ref >90)
Glucose: 154 mg/dl — ABNORMAL HIGH (ref 70–140)
Potassium: 4 mEq/L (ref 3.5–5.1)
Sodium: 140 mEq/L (ref 136–145)
TOTAL PROTEIN: 7.6 g/dL (ref 6.4–8.3)

## 2016-05-11 LAB — CBC WITH DIFFERENTIAL/PLATELET
BASO%: 0.6 % (ref 0.0–2.0)
Basophils Absolute: 0 10*3/uL (ref 0.0–0.1)
EOS%: 0.5 % (ref 0.0–7.0)
Eosinophils Absolute: 0 10*3/uL (ref 0.0–0.5)
HEMATOCRIT: 41.8 % (ref 34.8–46.6)
HGB: 13.7 g/dL (ref 11.6–15.9)
LYMPH#: 2.6 10*3/uL (ref 0.9–3.3)
LYMPH%: 39.9 % (ref 14.0–49.7)
MCH: 25.4 pg (ref 25.1–34.0)
MCHC: 32.8 g/dL (ref 31.5–36.0)
MCV: 77.4 fL — ABNORMAL LOW (ref 79.5–101.0)
MONO#: 0.3 10*3/uL (ref 0.1–0.9)
MONO%: 5 % (ref 0.0–14.0)
NEUT%: 54 % (ref 38.4–76.8)
NEUTROS ABS: 3.6 10*3/uL (ref 1.5–6.5)
PLATELETS: 189 10*3/uL (ref 145–400)
RBC: 5.4 10*6/uL (ref 3.70–5.45)
RDW: 15.2 % — AB (ref 11.2–14.5)
WBC: 6.6 10*3/uL (ref 3.9–10.3)

## 2016-05-11 MED ORDER — ANASTROZOLE 1 MG PO TABS: 1.0000 mg | ORAL_TABLET | Freq: Every day | ORAL | 4 refills | Status: DC

## 2016-05-11 NOTE — Patient Instructions (Signed)

## 2016-05-11 NOTE — Therapy (Signed)
[place], [place], [address] Phone: [phone]   Fax:  [phone]  Physical Therapy Evaluation  Patient Details  Name: Tanya [name] MRN: [id number] Date of Birth: 10-04-1955 Referring Provider: Dr. Rolm Bookbinder  Encounter Date: 05/11/2016      PT End of Session - 05/11/16 1634    Visit Number 1   Number of Visits 1   PT Start Time 7893   PT Stop Time 1459   PT Time Calculation (min) 24 min   Activity Tolerance Patient tolerated treatment well   Behavior During Therapy [facility] for tasks assessed/performed      Past Medical History:  Diagnosis Date  . ANXIETY   . Dyspnea on exertion   . HYPERLIPIDEMIA   . HYPERTENSION, BENIGN ESSENTIAL   . Malignant neoplasm of upper-outer quadrant of left breast in female, estrogen receptor positive ([place]) 05/04/2016  . OBESITY     Past Surgical History:  Procedure Laterality Date  . CESAREAN SECTION    . TUBAL LIGATION      There were no vitals filed for this visit.       Subjective Assessment - 05/11/16 1613    Subjective Patient reports she is here today to be seen by her medical team for her newly diagnosed left breast cancer.   Patient is accompained by: Family member   Pertinent History Patient was diagnosed on 04/19/16 with left grade 3 invasive ductal carcinoma breast cancer. There are 2 areas measuring 1.1 cm and 1.8 cm covering a total area of 3.5 cm. This is located in the upper outer quadrant, is ER positive, PR negative, and HER2 negative with a Ki67 of 40%. She does have a biopsied positive axillary lymph node.   Patient Stated Goals Reduce lymphedema risk and learn post op shoulder ROM HEP   Currently in Pain? No/denies            [facility] PT Assessment - 05/11/16 0001      Assessment   Medical Diagnosis Left breast cancer   Referring Provider Dr. Rolm Bookbinder   Onset Date/Surgical Date 04/19/16   Hand Dominance Right   Prior Therapy none     Precautions   Precautions Other (comment)   Precaution Comments active cancer     Restrictions   Weight Bearing Restrictions No     Balance Screen   Has the patient fallen in the past 6 months No   Has the patient had a decrease in activity level because of a fear of falling?  No   Is the patient reluctant to leave their home because of a fear of falling?  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 [employer] She does not exercise     Cognition   Overall Cognitive Status Within Functional Limits for tasks assessed     Posture/Postural Control   Posture/Postural Control Postural limitations   Postural Limitations Rounded Shoulders;Forward head     ROM / Strength   AROM / PROM / Strength AROM;Strength     AROM   AROM Assessment Site Shoulder   Right/Left Shoulder Right;Left   Right Shoulder Extension 37 Degrees   Right Shoulder Flexion 140 Degrees   Right Shoulder ABduction  158 Degrees   Right Shoulder Internal Rotation 60 Degrees   Right Shoulder External Rotation 83 Degrees   Left Shoulder Extension 35 Degrees   Left Shoulder Flexion 138 Degrees   Left Shoulder ABduction 156 Degrees   Left Shoulder Internal Rotation 47 Degrees   Left Shoulder External Rotation 90 Degrees     Strength   Overall Strength Within functional limits for tasks performed           LYMPHEDEMA/ONCOLOGY QUESTIONNAIRE - 05/11/16 1632      Type   Cancer Type Left breast cancer     Lymphedema Assessments   Lymphedema Assessments Upper extremities     Right Upper Extremity Lymphedema   10 cm Proximal to Olecranon Process 31.5 cm   Olecranon Process 27.5 cm   10 cm Proximal to Ulnar Styloid Process 24.8 cm   Just  Proximal to Ulnar Styloid Process 15.8 cm   Across Hand at [employer] 19.4 cm   At [place] of 2nd Digit 6.8 cm     Left Upper Extremity Lymphedema   10 cm Proximal to Olecranon Process 30.8 cm   Olecranon Process 25.9 cm   10 cm Proximal to Ulnar Styloid Process 23.5 cm   Just Proximal to Ulnar Styloid Process 16.6 cm   Across Hand at [employer] 19 cm   At [place] of 2nd Digit 6.4 cm       Patient was instructed today in a home exercise program today for post op shoulder range of motion. These included active assist shoulder flexion in sitting, scapular retraction, wall walking with shoulder abduction, and hands behind head external rotation.  She was encouraged to do these twice a day, holding 3 seconds and repeating 5 times when permitted by her physician.          PT Education - 05/11/16 1633    Education provided Yes   Education Details Lymphedema risk reduction and post op shoulder ROM HEP   Person(s) Educated Patient;Spouse;Child(ren)   Methods Explanation;Demonstration;Handout   Comprehension Returned demonstration;Verbalized understanding              [facility] Goals - 05/11/16 1637      Patient will be able to verbalize understanding of pertinent lymphedema risk reduction practices relevant to her diagnosis specifically related to skin care.   Time 1   Period Days   Status Achieved     Patient will be able to return demonstrate and/or verbalize understanding of the post-op home exercise program related to regaining shoulder range of motion.   Time 1   Period Days   Status Achieved     Patient will be able to verbalize understanding of the importance of attending the postoperative After Breast Cancer Class for further lymphedema risk reduction education and therapeutic exercise.   Time 1   Period Days   Status Achieved              Plan - 05/11/16 1634    Clinical Impression Statement Patient was diagnosed on 04/19/16 with left grade 3  invasive ductal carcinoma breast cancer. There are 2 areas measuring 1.1 cm and 1.8 cm covering a total area of 3.5 cm. This is located in the upper outer quadrant, is ER positive, PR negative, and HER2 negative with a Ki67 of 40%. She does have a biopsied positive axillary lymph node. Her multidisciplinary medical team met prior to her assessment to determine a recommended treatment plan. She is planning to have neoadjuvant chemotherapy  if her Mammaprint is positive followed by a left lumpectomy and targeted axillary node dissection. She will undergo radiation and anti-estrogen therapy. She may benefit from post op PT to regain shoulder and reduce lymphedema risk. Due to her lack of comorbidities, her eval is of low complexity.   Rehab Potential Excellent   Clinical Impairments Affecting Rehab Potential none   PT Frequency One time visit   PT Treatment/Interventions Therapeutic exercise;Patient/family education   PT Next Visit Plan Will f/u after surgery to determine need   PT Home Exercise Plan Post op shoulder ROM HEP   Consulted and Agree with Plan of Care Patient;Family member/caregiver   Family Member Consulted Husband and 2 grown kids      Patient will benefit from skilled therapeutic intervention in order to improve the following deficits and impairments:  Postural dysfunction, Decreased knowledge of precautions, Pain, Impaired UE functional use, Decreased range of motion  Visit Diagnosis: Carcinoma of upper-outer quadrant of left breast in female, estrogen receptor positive ([place]) - Plan: PT plan of care cert/re-cert  Abnormal posture - Plan: PT plan of care cert/re-cert   Patient will follow up at outpatient cancer rehab if needed following surgery.  If the patient requires physical therapy at that time, a specific plan will be dictated and sent to the referring physician for approval. The patient was educated today on appropriate basic range of motion exercises to begin post operatively  and the importance of attending the After Breast Cancer class following surgery.  Patient was educated today on lymphedema risk reduction practices as it pertains to recommendations that will benefit the patient immediately following surgery.  She verbalized good understanding.  No additional physical therapy is indicated at this time.      Problem List Patient Active Problem List   Diagnosis Date Noted  . Malignant neoplasm of upper-outer quadrant of left breast in female, estrogen receptor positive ([place]) 05/04/2016  . Encounter for preventative adult health care examination 01/26/2016  . Elevated hemoglobin A1c 06/16/2014  . Blurry vision, bilateral 06/16/2014  . Seasonal allergies 05/14/2014  . Murmur 02/25/2013  . Dyspnea on exertion 11/29/2012  . Hyperlipidemia 02/10/2009  . Obesity 02/10/2009  . Anxiety state 02/10/2009  . HYPERTENSION, BENIGN ESSENTIAL 06/01/2006    Annia Friendly, PT 05/11/16 4:41 PM  [place], [place], [address] Phone: [phone]   Fax:  [phone]  Name: Tanya [name] MRN: [id number] Date of Birth: 14-Nov-1955

## 2016-05-11 NOTE — Progress Notes (Signed)
[place]  [phone] Fax:([phone]     ID: Tanya [name] DOB: 24-Apr-1955  MR#: [id number]  GBE#:[id number]  Patient Care Team: Everrett Coombe, MD as PCP - General Rolm Bookbinder, MD as Consulting Physician (General Surgery) Chauncey Cruel, MD as Consulting Physician (Oncology) Kyung Rudd, MD as Consulting Physician (Radiation Oncology) Chauncey Cruel, MD OTHER MD:  CHIEF COMPLAINT: Estrogen receptor positive breast cancer  CURRENT TREATMENT: Neoadjuvant therapy   BREAST CANCER HISTORY: Tanya [name] had routine bilateral screening mammography at the [facility] 04/19/2016. This showed a possible area of the symmetry in the left breast and a prominent left axillary lymph nodes. On 04/28/2016 she underwent diagnostic left mammography with tomography and left breast ultrasonography. This showed the breast density to be category B. There was an obscured mass in the upper outer left breast and an enlarged left axillary lymph node. On exam there was focal thickening at the 2:00 position of the left breast 9 cm from the nipple. Targeted ultrasonography confirmed a 1.8 cm hypoechoic mass with a second adjacent 1.1 cm mass both at the 2:00 position of the left breast 9 cm from the nipple. The conglomerate area measured 3.5 cm. There was also an enlarged left axillary lymph node with loss of fatty hilum measuring 1.8 cm.  On family 16 2018 the patient underwent biopsy of the larger left breast mass in question and the suspicious left axillary lymph node. 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  The patient's subsequent history is as detailed below  INTERVAL HISTORY: Tanya [name] was evaluated in the multidisciplinary breast cancer clinic  05/11/2016 accompanied by her husband, sister, and by her son Tanya [name] and daughter Tanya [name]. Her case was also presented in the multidisciplinary breast cancer conference that same morning. At that time a preliminary plan was proposed:  REVIEW OF SYSTEMS: There were no specific symptoms leading to the original mammogram, which was routinely scheduled. The patient denies unusual headaches, visual changes, nausea, vomiting, stiff neck, dizziness, or gait imbalance. There has been no cough, phlegm production, or pleurisy, no chest pain or pressure, and no change in bowel or bladder habits. The patient denies fever, rash, bleeding, unexplained fatigue or unexplained weight loss. She admits to occasional problems with heartburn, and some aches and pains here and there which are not more intense or persistent than usual. A detailed review of systems was otherwise entirely negative.   PAST MEDICAL HISTORY: Past Medical History:  Diagnosis Date  . ANXIETY   . Dyspnea on exertion   . HYPERLIPIDEMIA   . HYPERTENSION, BENIGN ESSENTIAL   . Malignant neoplasm of upper-outer quadrant of left breast in female, estrogen receptor positive ([place]) 05/04/2016  . OBESITY     PAST SURGICAL HISTORY: Past Surgical History:  Procedure Laterality Date  . CESAREAN SECTION    . TUBAL LIGATION      FAMILY HISTORY Family History  Problem Relation Age of Onset  . Breast cancer Mother 70  The patient's father had a history of prostate cancer. He died at age 50. The patient's mother died at age 38 from unclear causes. She had a history of breast cancer diagnosed in her 65s. The patient had 4 brothers, 2 sisters. There is no other history of breast or ovarian cancer in the family to the patient's knowledge  GYNECOLOGIC HISTORY:  No LMP recorded. Patient is postmenopausal. Menarche age 53, first live birth age 57, the patient is [place] P4. She stopped having periods approximately 2006. She did not use hormone replacement. She  used oral contraceptives briefly and remotely, with no complications.  SOCIAL HISTORY:  The patient is originally from [name]. 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 The patient's husband [name] was also a professor at [employer], where he taught agriculture oh research. Son [name] works in Land, son Tanya [name] works for [employer], daughter Tanya [name] is a Education officer, museum, and daughter Tanya [name] is also a Education officer, museum, all living in [place]. The patient has 3 grandchildren. She attends [employer]    ADVANCED DIRECTIVES: Not in place   HEALTH MAINTENANCE: Social History  Substance Use Topics  . Smoking status: Never Smoker  . Smokeless tobacco: Never Used  . Alcohol use No     Colonoscopy:Never  PAP: November 2017  Bone density: Never   Allergies  Allergen Reactions  . Camoquin [Amodiaquine] Itching    Current Outpatient Prescriptions  Medication Sig Dispense Refill  . losartan (COZAAR) 50 MG tablet Take 1 tablet (50 mg total) by mouth daily. 90 tablet 2  . pravastatin (PRAVACHOL) 40 MG tablet Take 1 tablet (40 mg total) by mouth at bedtime. 90 tablet 2  . anastrozole (ARIMIDEX) 1 MG tablet Take 1 tablet (1 mg total) by mouth daily. 90 tablet 4   No current facility-administered medications for this visit.     OBJECTIVE: Middle-aged African-American woman in no acute distress Vitals:   05/11/16 1247  BP: (!) 183/94  Pulse: (!) 101  Resp: 18  Temp: 98.3 F (36.8 C)     Body mass index is 35.06 kg/m.    ECOG FS:0 - Asymptomatic  Ocular: Sclerae unicteric, pupils equal, round and reactive to light Ear-nose-throat: Oropharynx clear and moist Lymphatic: No cervical or supraclavicular adenopathy Lungs no rales or rhonchi, good excursion bilaterally Heart regular rate and rhythm, no murmur appreciated Abd soft, nontender, positive bowel sounds MSK no focal spinal tenderness, no joint edema Neuro: non-focal, well-oriented, appropriate  affect Breasts: The right breast is unremarkable. The left breast is status post recent biopsy. I do not palpate a well demarcated mass. There are no skin or nipple changes of concern. The left axilla is benign.   LAB RESULTS:  CMP     Component Value Date/Time   NA 140 05/11/2016 1233   K 4.0 05/11/2016 1233   CL 102 01/26/2016 1629   CO2 26 05/11/2016 1233   GLUCOSE 154 (H) 05/11/2016 1233   BUN 7.7 05/11/2016 1233   CREATININE 0.9 05/11/2016 1233   CALCIUM 9.6 05/11/2016 1233   PROT 7.6 05/11/2016 1233   ALBUMIN 4.0 05/11/2016 1233   AST 15 05/11/2016 1233   ALT 16 05/11/2016 1233   ALKPHOS 74 05/11/2016 1233   BILITOT 0.53 05/11/2016 1233    INo results found for: SPEP, UPEP  Lab Results  Component Value Date   WBC 6.6 05/11/2016   NEUTROABS 3.6 05/11/2016   HGB 13.7 05/11/2016   HCT 41.8 05/11/2016   MCV 77.4 (L) 05/11/2016   PLT 189 05/11/2016      Chemistry      Component Value Date/Time   NA 140 05/11/2016 1233   K 4.0 05/11/2016 1233   CL 102 01/26/2016 1629   CO2 26 05/11/2016 1233   BUN 7.7 05/11/2016 1233   CREATININE 0.9 05/11/2016 1233  Component Value Date/Time   CALCIUM 9.6 05/11/2016 1233   ALKPHOS 74 05/11/2016 1233   AST 15 05/11/2016 1233   ALT 16 05/11/2016 1233   BILITOT 0.53 05/11/2016 1233       No results found for: LABCA2  No components found for: LABCA125  No results for input(s): INR in the last 168 hours.  Urinalysis    Component Value Date/Time   COLORURINE yellow 06/01/2006 1116   APPEARANCEUR Clear 06/01/2006 1116   LABSPEC 1.010 06/01/2006 1116   PHURINE 6.5 06/01/2006 1116   HGBUR negative 06/01/2006 1116   BILIRUBINUR negative 06/01/2006 1116   UROBILINOGEN 0.2 06/01/2006 1116   NITRITE negative 06/01/2006 1116     STUDIES: Mm Digital Screening  Result Date: 04/19/2016 CLINICAL DATA:  Screening. EXAM: DIGITAL SCREENING BILATERAL MAMMOGRAM WITH CAD COMPARISON:  Previous exam(s). ACR Breast Density  Category c: The breast tissue is heterogeneously dense, which may obscure small masses. FINDINGS: In the left breast, a possible asymmetry and prominent left axillary lymph node warrant further evaluation. In the right breast, no findings suspicious for malignancy. Images were processed with CAD. IMPRESSION: Further evaluation is suggested for possible asymmetry in the left breast. RECOMMENDATION: Diagnostic mammogram and possibly ultrasound of the left breast. (Code:[id number]) The patient will be contacted regarding the findings, and additional imaging will be scheduled. BI-RADS CATEGORY  0: Incomplete. Need additional imaging evaluation and/or prior mammograms for comparison. Electronically Signed   By: Fidela Salisbury M.D.   On: 04/19/2016 15:55   US Breast Ltd Uni Left Inc Axilla  Result Date: 04/28/2016 CLINICAL DATA:  61 year old female for further evaluation of left breast asymmetry and possible enlarged left axillary lymph node identified on screening mammogram. EXAM: 2D DIGITAL DIAGNOSTIC LEFT MAMMOGRAM WITH CAD AND ADJUNCT TOMO ULTRASOUND LEFT BREAST COMPARISON:  Previous exam(s). ACR Breast Density Category b: There are scattered areas of fibroglandular density. FINDINGS: 2D and 3D full field views of the left breast demonstrate a an obscured mass within the upper-outer left breast and an enlarged left axillary lymph node. Mammographic images were processed with CAD. On physical exam, focal thickening is identified at the 2 o'clock position of the left breast 9 cm from the nipple. Targeted ultrasound is performed, showing a 1.3 x 0.9 x 1.8 cm irregular hypoechoic mass with adjacent 1.1 cm mass/ enlarged intraparenchymal lymph node at the 2 o'clock position of the left breast 9-11 cm from the nipple. The conglomerate area including this mass and possible enlarged intraparenchymal lymph node measures 3.5 cm in greatest longitudinal dimension. An enlarged left axillary lymph node with loss of fatty  hilum is noted measuring 1.8 cm in greatest diameter. IMPRESSION: Highly suspicious mass within the upper-outer left breast and enlarged left axillary lymph node. Tissue sampling is recommended. RECOMMENDATION: Ultrasound-guided left breast and left axillary biopsies, which has been scheduled for 04/29/2016. I have discussed the findings and recommendations with the patient. Results were also provided in writing at the conclusion of the visit. If applicable, a reminder letter will be sent to the patient regarding the next appointment. BI-RADS CATEGORY  4: Suspicious. Electronically Signed   By: Margarette Canada M.D.   On: 04/28/2016 16:30   Mm Diag Breast Tomo Uni Left  Result Date: 04/28/2016 CLINICAL DATA:  61 year old female for further evaluation of left breast asymmetry and possible enlarged left axillary lymph node identified on screening mammogram. EXAM: 2D DIGITAL DIAGNOSTIC LEFT MAMMOGRAM WITH CAD AND ADJUNCT TOMO ULTRASOUND LEFT BREAST COMPARISON:  Previous exam(s). ACR Breast Density  Category b: There are scattered areas of fibroglandular density. FINDINGS: 2D and 3D full field views of the left breast demonstrate a an obscured mass within the upper-outer left breast and an enlarged left axillary lymph node. Mammographic images were processed with CAD. On physical exam, focal thickening is identified at the 2 o'clock position of the left breast 9 cm from the nipple. Targeted ultrasound is performed, showing a 1.3 x 0.9 x 1.8 cm irregular hypoechoic mass with adjacent 1.1 cm mass/ enlarged intraparenchymal lymph node at the 2 o'clock position of the left breast 9-11 cm from the nipple. The conglomerate area including this mass and possible enlarged intraparenchymal lymph node measures 3.5 cm in greatest longitudinal dimension. An enlarged left axillary lymph node with loss of fatty hilum is noted measuring 1.8 cm in greatest diameter. IMPRESSION: Highly suspicious mass within the upper-outer left breast and  enlarged left axillary lymph node. Tissue sampling is recommended. RECOMMENDATION: Ultrasound-guided left breast and left axillary biopsies, which has been scheduled for 04/29/2016. I have discussed the findings and recommendations with the patient. Results were also provided in writing at the conclusion of the visit. If applicable, a reminder letter will be sent to the patient regarding the next appointment. BI-RADS CATEGORY  4: Suspicious. Electronically Signed   By: Margarette Canada M.D.   On: 04/28/2016 16:30   Mm Clip Placement Left  Result Date: 04/29/2016 CLINICAL DATA:  Post ultrasound-guided biopsy of a suspicious mass in the left breast at 2 o'clock 9 cm from nipple as well as of an enlarged axillary lymph node. EXAM: DIAGNOSTIC LEFT MAMMOGRAM POST ULTRASOUND BIOPSY COMPARISON:  Previous exam(s). FINDINGS: Mammographic images were obtained following ultrasound guided biopsy of a suspicious mass in the left breast at 2 o'clock as well as biopsy of a suspicious axillary lymph node. A ribbon shaped biopsy marking is present, however located approximately 2.5 cm lateral and slightly superior to the mass. This is felt to be related to clip migration as a result of bleeding, however recommend both the clip and mass be removed at time of surgery. IMPRESSION: Ribbon shaped biopsy marking clip located approximately 2.5 cm lateral and slightly superior to the biopsied mass felt to be related to clip migration from bleeding. Recommend both the ribbon shaped biopsy marking clip and mass be removed at time of surgery. Final Assessment: Post Procedure Mammograms for Marker Placement Electronically Signed   By: Everlean Alstrom M.D.   On: 04/29/2016 17:06   [place] Lt Breast Bx W Loc Dev 1st Lesion Img Bx Spec US Guide  Addendum Date: 05/03/2016   ADDENDUM REPORT: 05/03/2016 08:16 ADDENDUM: Pathology revealed INVASIVE MAMMARY CARCINOMA, HIGH GRADE, FAVOR DUCTAL CARCINOMA. CARCINOMA IN SITU, SOLID TYPE of the Left breast,  2:00. INVASIVE CARCINOMA METASTATIC TO LYMPH NODE(S) of the Left axilla. This was found to be concordant by Dr. Everlean Alstrom. Pathology results were discussed with the patient by telephone. The patient reported doing well after the biopsies with tenderness at the sites. Post biopsy instructions and care were reviewed and questions were answered. The patient was encouraged to call [facility] for any additional concerns. The patient was referred to [facility] on May 11, 2016. Pathology results reported by Terie Purser, RN on 05/03/2016. Electronically Signed   By: Everlean Alstrom M.D.   On: 05/03/2016 08:16   Result Date: 05/03/2016 CLINICAL DATA:  61 year old female with a suspicious mass in the left  breast at the 2 o'clock position and suspicious lymph node in the left axilla. EXAM: ULTRASOUND GUIDED LEFT BREAST CORE NEEDLE BIOPSY COMPARISON:  Previous exam(s). FINDINGS: I met with the patient and we discussed the procedure of ultrasound-guided biopsy, including benefits and alternatives. We discussed the high likelihood of a successful procedure. We discussed the risks of the procedure, including infection, bleeding, tissue injury, clip migration, and inadequate sampling. Informed written consent was given. The usual time-out protocol was performed immediately prior to the procedure. SITE 1: LEFT BREAST 2 O'CLOCK MASS: Using sterile technique and 1% Lidocaine as local anesthetic, under direct ultrasound visualization, a 12 gauge spring-loaded device was used to perform biopsy of the mass in the left breast at the 2 o'clock position using a lateral to medial approach. At the conclusion of the procedure a ribbon shaped tissue marker clip was deployed into the biopsy cavity. SITE 2: LEFT AXILLA LYMPH NODE: Using sterile technique and 1% Lidocaine as local anesthetic, under direct ultrasound  visualization, a 14 gauge spring-loaded device was used to perform biopsy of the enlarged in round lymph node in the left axilla using a lateral to medial approach. At the conclusion of the procedure a spiral shaped HydroMARK tissue marker clip was deployed into the biopsy cavity. Follow up 2 view mammogram was performed and dictated separately. IMPRESSION: 1. Ultrasound-guided biopsy of the suspicious mass in the left breast at 2 o'clock, at site of ribbon shaped biopsy marking clip. 2. Ultrasound-guided biopsy of the suspicious lymph node in the left axilla, at site of spiral HydroMARK biopsy marking clip. Electronically Signed: By: Everlean Alstrom M.D. On: 04/29/2016 16:38   [place] Lt Breast Bx W Loc Dev Ea Add Lesion Img Bx Spec US Guide  Addendum Date: 05/03/2016   ADDENDUM REPORT: 05/03/2016 08:16 ADDENDUM: Pathology revealed INVASIVE MAMMARY CARCINOMA, HIGH GRADE, FAVOR DUCTAL CARCINOMA. 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 INVASIVE CARCINOMA METASTATIC TO LYMPH NODE(S) of the Left axilla. This was found to be concordant by Dr. Everlean Alstrom. Pathology results were discussed with the patient by telephone. The patient reported doing well after the biopsies with tenderness at the sites. Post biopsy instructions and care were reviewed and questions were answered. The patient was encouraged to call [facility] for any additional concerns. The patient was referred to [facility] on May 11, 2016. Pathology results reported by Terie Purser, RN on 05/03/2016. Electronically Signed   By: Everlean Alstrom M.D.   On: 05/03/2016 08:16   Result Date: 05/03/2016 CLINICAL DATA:  61 year old female with a suspicious mass in the left breast at the 2 o'clock position and suspicious lymph node in the left axilla. EXAM: ULTRASOUND GUIDED LEFT BREAST CORE NEEDLE BIOPSY COMPARISON:  Previous exam(s).  FINDINGS: I met with the patient and we discussed the procedure of ultrasound-guided biopsy, including benefits and alternatives. We discussed the high likelihood of a successful procedure. We discussed the risks of the procedure, including infection, bleeding, tissue injury, clip migration, and inadequate sampling. Informed written consent was given. The usual time-out protocol was performed immediately prior to the procedure. SITE 1: LEFT BREAST 2 O'CLOCK MASS: Using sterile technique and 1% Lidocaine as local anesthetic, under direct ultrasound visualization, a 12 gauge spring-loaded device was used to perform biopsy of the mass in the left breast at the 2 o'clock position using a lateral to medial approach. At the conclusion of the procedure  a ribbon shaped tissue marker clip was deployed into the biopsy cavity. SITE 2: LEFT AXILLA LYMPH NODE: Using sterile technique and 1% Lidocaine as local anesthetic, under direct ultrasound visualization, a 14 gauge spring-loaded device was used to perform biopsy of the enlarged in round lymph node in the left axilla using a lateral to medial approach. At the conclusion of the procedure a spiral shaped HydroMARK tissue marker clip was deployed into the biopsy cavity. Follow up 2 view mammogram was performed and dictated separately. IMPRESSION: 1. Ultrasound-guided biopsy of the suspicious mass in the left breast at 2 o'clock, at site of ribbon shaped biopsy marking clip. 2. Ultrasound-guided biopsy of the suspicious lymph node in the left axilla, at site of spiral HydroMARK biopsy marking clip. Electronically Signed: By: Everlean Alstrom M.D. On: 04/29/2016 16:38    ELIGIBLE FOR AVAILABLE RESEARCH PROTOCOL: Prevent  ASSESSMENT: 61 y.o. [place] woman status post left breast upper inner quadrant and left axillary lymph node biopsy every 16 2018, both showing invasive ductal carcinoma, grade 3, clinically T1c N1a, or stage IIB, estrogen receptor positive, progesterone  receptor and HER-2 negative, with an MIB-1 of 40%  (1) neoadjuvant anastrozole started 05/11/2016  (2) Oncotype DX requested from original biopsy: Chemotherapy is anticipated  (3) if chemotherapy is given it will consist of doxorubicin and cyclophosphamide in dose dense fashion 4 followed by weekly paclitaxel 12  (4) breast conserving surgery with targeted axillary dissection to follow  (5) adjuvant radiation as appropriate  (6) adjuvant anti-estrogens to be continued a minimum of 5 years    PLAN: We spent the better part of today's hour-long appointment discussing the biology of breast cancer in general, and the specifics of the patient's tumor in particular. We first reviewed the fact that cancer is not one disease but more than 100 different diseases and that it is important to keep them separate-- otherwise when friends and relatives discuss their own cancer experiences with [place] confusion can result. Similarly we explained that if breast cancer spreads to the bone or liver, the patient would not have bone cancer or liver cancer, but breast cancer in the bone and breast cancer in the liver: one cancer in three places-- not 3 different cancers which otherwise would have to be treated in 3 different ways.  We discussed the difference between local and systemic therapy. In terms of loco-regional treatment, lumpectomy plus radiation is equivalent to mastectomy as far as survival is concerned. For this reason we recommend breast conserving surgery. We also noted that in terms of sequencing of treatments, whether systemic therapy or surgery is done first does not affect the ultimate outcome. This is relevant to Saher's case, as we feel she would benefit from neoadjuvant systemic treatment to simplify her surgery and improve the cosmetic result  We then discussed the rationale for systemic therapy. There is some risk that this cancer may have already spread to other parts of her body.  Patients frequently ask at this point about bone scans, CAT scans and PET scans to find out if they have occult breast cancer somewhere else. The problem is that in early stage disease we are much more likely to find false positives then true cancers and this would expose the patient to unnecessary procedures as well as unnecessary radiation. Scans cannot answer the question the patient really would like to know, which is whether she has microscopic disease elsewhere in her body. For those reasons we do not recommend them.  Of course we would  proceed to aggressive evaluation of any symptoms that might suggest metastatic disease, but that is not the case here.  Next we went over the options for systemic therapy which are anti-estrogens, anti-HER-2 immunotherapy, and chemotherapy. Yulianna does not meet criteria for anti-HER-2 immunotherapy. She is a good candidate for anti-estrogens.  The question of chemotherapy is more complicated. Chemotherapy is most effective in rapidly growing, aggressive tumors like Denean 's. For that reason I anticipate she will need and benefit from chemotherapy. We are going to request an Oncotype from the definitive surgical sample, as suggested by NCCN guidelines, to confirm this.   In the meantime, while waiting on the final Oncotype results and other pending tests including breast MRI, we are starting anastrozole. This will initiate treatment so that there is no concern if definitive surgical delays occur. We discussed the possible toxicities, side effects and complications of this agent and the prescription was placed. She was started today.  The overall plan, then, is to obtain a breast MRI while Oncotype results are pending; likely proceed to neoadjuvant chemotherapy with standard therapy (AC/T), followed by breast conserving surgery with targeted axillary dissection, then adjuvant radiation, and continuation of anti-estrogen therapy for a minimum of 5 years  Oluwaseyi  has a good understanding of the overall plan. She agrees with it. She knows the goal of treatment in her case is cure. She will call with any problems that may develop before her next visit here.  Chauncey Cruel, MD   05/11/2016 5:52 PM Medical Oncology and Hematology [facility] [address] [place], [place] [address] Tel. [phone]    Fax. [phone]

## 2016-05-11 NOTE — Telephone Encounter (Signed)
Received order for Mammaprint testing on core bx from Dr. Jana Hakim. Requisition sent to [facility]

## 2016-05-11 NOTE — Progress Notes (Signed)
Radiation Oncology         [phone] ________________________________  Name: Tanya [name] MRN: [id number]  Date: 05/11/2016  DOB: February 24, 1956  ID:[id number] Burr Medico, MD  Rolm Bookbinder, MD     REFERRING PHYSICIAN: Rolm Bookbinder, MD   DIAGNOSIS: The encounter diagnosis was Malignant neoplasm of upper-outer quadrant of left breast in female, estrogen receptor positive ([place]).   HISTORY OF PRESENT ILLNESS: Tanya [name] is a 61 y.o. female seen at the multidisciplinary clinic for newly diagnosed left sided breast cancer. Routine mammogram on 04/19/16 showed possible asymmetry in the left breast. Ultrasound on 04/29/16 revealed a highly suspicious mass within the upper-outer quadrant of the left breast at the 2 o'clock position and an enlarged left axillary lymph node. 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 Receptor status is ER (95%) PR (0%) Her2 (-) and Ki67 (40%). She comes today for discussion of treatment options of her breast cancer.   PREVIOUS RADIATION THERAPY: No   PAST MEDICAL HISTORY:  Past Medical History:  Diagnosis Date  . ANXIETY   . Dyspnea on exertion   . HYPERLIPIDEMIA   . HYPERTENSION, BENIGN ESSENTIAL   . Malignant neoplasm of upper-outer quadrant of left breast in female, estrogen receptor positive ([place]) 05/04/2016  . OBESITY        PAST SURGICAL HISTORY: Past Surgical History:  Procedure Laterality Date  . CESAREAN SECTION    . TUBAL LIGATION       FAMILY HISTORY:  Family History  Problem Relation Age of Onset  . Breast cancer Mother 32     SOCIAL HISTORY:  reports that she has never smoked. She has never used smokeless tobacco. She reports that she does not drink alcohol or use drugs. The patient is married and lives in [place]. 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 She has 4 children.   ALLERGIES: Patient has no known allergies.   MEDICATIONS:  Current Outpatient Prescriptions  Medication Sig Dispense Refill  . anastrozole (ARIMIDEX) 1 MG tablet Take 1 tablet (1 mg total) by mouth daily. 90 tablet 4  . losartan (COZAAR) 50 MG tablet Take 1 tablet (50 mg total) by mouth daily. 90 tablet 2  . pravastatin (PRAVACHOL) 40 MG tablet Take 1 tablet (40 mg total) by mouth at bedtime. 90 tablet 2   No current facility-administered medications for this encounter.      REVIEW OF SYSTEMS: On review of systems, the patient reports that she is doing well overall. She denies any shortness of breath, cough, fevers, chills, night sweats, unintended weight changes. She denies any bowel or bladder disturbances, and denies abdominal pain, nausea or vomiting. She denies any new musculoskeletal or joint aches or pains. Patient reports loss of sleep, general pain, chest pain, heart burn, back pain, arthritis, and phobias. A complete review of systems is obtained and is otherwise negative.     PHYSICAL EXAM:  Wt Readings from Last 3 Encounters:  05/11/16 197 lb 14.4 oz (89.8 kg)  01/26/16 203 lb (92.1 kg)  12/03/14 195 lb (88.5 kg)   Temp Readings from Last 3 Encounters:  05/11/16 98.3 F (36.8 C) (Oral)  01/26/16 98 F (36.7 C) (Oral)  12/03/14 98.6 F (37 C) (Oral)   BP Readings from Last 3 Encounters:  05/11/16 (!) 183/94  01/26/16 (!) 142/90  12/03/14 (!) 129/96   Pulse Readings from Last 3 Encounters:  05/11/16 (!) 101  01/26/16 86  12/03/14 86    In general this is a well appearing African female in no acute distress. She is alert and oriented x4 and appropriate throughout the examination. HEENT reveals that the patient is normocephalic, atraumatic. EOMs are intact. PERRLA. Skin is intact without any evidence of gross lesions. Cardiovascular exam reveals a regular rate and rhythm, no clicks rubs or murmurs are  auscultated. Chest is clear to auscultation bilaterally. Lymphatic assessment is performed and does not reveal any adenopathy in the cervical, supraclavicular, axillary, or inguinal chains. Bilateral breast exam is performed and reveals post biopsy change of the left breast with mild ecchymosis deep to the biopsy site. No palpable mass is noted in the right breast. Neither breast has any evidence of nipple bleeding or discharge.  Abdomen has active bowel sounds in all quadrants and is intact. The abdomen is soft, non tender, non distended. Lower extremities are negative for pretibial pitting edema, deep calf tenderness, cyanosis or clubbing.   ECOG = 0  0 - Asymptomatic (Fully active, able to carry on all predisease activities without restriction)  1 - Symptomatic but completely ambulatory (Restricted in physically strenuous activity but ambulatory and able to carry out work of a light or sedentary nature. For example, light housework, office work)  2 - Symptomatic, <50% in bed during the day (Ambulatory and capable of all self care but unable to carry out any work activities. Up and about more than 50% of waking hours)  3 - Symptomatic, >50% in bed, but not bedbound (Capable of only limited self-care, confined to bed or chair 50% or more of waking hours)  4 - Bedbound (Completely disabled. Cannot carry on any self-care. Totally confined to bed or chair)  5 - Death   Eustace Pen MM, Creech RH, Tormey DC, et al. [phone]). "Toxicity and response criteria of the [facility]". [place] Oncol. 5 (6): 649-55    LABORATORY DATA:  Lab Results  Component Value Date   WBC 6.6 05/11/2016   HGB 13.7 05/11/2016   HCT 41.8 05/11/2016   MCV 77.4 (L) 05/11/2016   PLT 189 05/11/2016   Lab Results  Component Value Date   NA 140 05/11/2016   K 4.0 05/11/2016   CL 102 01/26/2016   CO2 26 05/11/2016   Lab Results  Component Value Date   ALT 16 05/11/2016   AST 15 05/11/2016    ALKPHOS 74 05/11/2016   BILITOT 0.53 05/11/2016      RADIOGRAPHY: Mm Digital Screening  Result Date: 04/19/2016 CLINICAL DATA:  Screening. EXAM: DIGITAL SCREENING BILATERAL MAMMOGRAM WITH CAD COMPARISON:  Previous exam(s). ACR Breast Density Category c: The breast tissue is heterogeneously dense, which may obscure small masses. FINDINGS: In the left breast, a possible asymmetry and prominent left axillary lymph node warrant further evaluation. In the right breast, no findings suspicious for malignancy. Images were processed with CAD. IMPRESSION: Further evaluation is suggested for possible asymmetry in the left breast. RECOMMENDATION: Diagnostic mammogram and possibly ultrasound of the left breast. (Code:[id number]) The patient will be contacted regarding the findings, and additional imaging will be scheduled. BI-RADS CATEGORY  0: Incomplete. Need additional imaging evaluation and/or prior mammograms for comparison. Electronically Signed   By: Fidela Salisbury M.D.   On: 04/19/2016 15:55   US Breast Ltd Uni Left Inc Axilla  Result Date: 04/28/2016 CLINICAL DATA:  61 year old female for further evaluation of left breast asymmetry and possible enlarged left axillary lymph node identified on screening mammogram. EXAM: 2D DIGITAL DIAGNOSTIC LEFT MAMMOGRAM WITH CAD AND ADJUNCT TOMO ULTRASOUND LEFT BREAST COMPARISON:  Previous exam(s). ACR Breast Density Category b: There are scattered areas of fibroglandular density. FINDINGS: 2D and 3D full field views of the left breast demonstrate a an obscured mass within the upper-outer left breast and an enlarged left axillary lymph node. Mammographic images were processed with CAD. On physical exam, focal thickening is identified at the 2 o'clock position of the left breast 9 cm from the nipple. Targeted ultrasound is performed, showing a 1.3 x 0.9 x 1.8 cm irregular hypoechoic mass with adjacent 1.1 cm mass/ enlarged intraparenchymal lymph node at the 2 o'clock  position of the left breast 9-11 cm from the nipple. The conglomerate area including this mass and possible enlarged intraparenchymal lymph node measures 3.5 cm in greatest longitudinal dimension. An enlarged left axillary lymph node with loss of fatty hilum is noted measuring 1.8 cm in greatest diameter. IMPRESSION: Highly suspicious mass within the upper-outer left breast and enlarged left axillary lymph node. Tissue sampling is recommended. RECOMMENDATION: Ultrasound-guided left breast and left axillary biopsies, which has been scheduled for 04/29/2016. I have discussed the findings and recommendations with the patient. Results were also provided in writing at the conclusion of the visit. If applicable, a reminder letter will be sent to the patient regarding the next appointment. BI-RADS CATEGORY  4: Suspicious. Electronically Signed   By: Margarette Canada M.D.   On: 04/28/2016 16:30   Mm Diag Breast Tomo Uni Left  Result Date: 04/28/2016 CLINICAL DATA:  61 year old female for further evaluation of left breast asymmetry and possible enlarged left axillary lymph node identified on screening mammogram. EXAM: 2D DIGITAL DIAGNOSTIC LEFT MAMMOGRAM WITH CAD AND ADJUNCT TOMO ULTRASOUND LEFT BREAST COMPARISON:  Previous exam(s). ACR Breast Density Category b: There are scattered areas of fibroglandular density. FINDINGS: 2D and 3D full field views of the left breast demonstrate a an obscured mass within the upper-outer left breast and an enlarged left axillary lymph node. Mammographic images were processed with CAD. On physical exam, focal thickening is identified at the 2 o'clock position of the left breast 9 cm from the nipple. Targeted ultrasound is performed, showing a 1.3 x 0.9 x 1.8 cm irregular hypoechoic mass with adjacent 1.1 cm mass/ enlarged intraparenchymal lymph node at the 2 o'clock position of the left breast 9-11 cm from the nipple. The conglomerate area including this mass and possible enlarged  intraparenchymal lymph node measures 3.5 cm in greatest longitudinal dimension. An enlarged left axillary lymph node with loss of fatty hilum is noted measuring 1.8 cm in greatest diameter. IMPRESSION: Highly suspicious mass within the upper-outer left breast and enlarged left axillary lymph node. Tissue sampling is recommended. RECOMMENDATION: Ultrasound-guided left breast and left axillary biopsies, which has been scheduled for 04/29/2016. I have discussed the findings and recommendations with the patient. Results were also provided in writing at the conclusion of the visit. If applicable, a reminder letter will be sent to the patient regarding the next appointment. BI-RADS CATEGORY  4: Suspicious. Electronically Signed   By: Margarette Canada M.D.   On: 04/28/2016 16:30   Mm Clip Placement Left  Result Date: 04/29/2016 CLINICAL DATA:  Post ultrasound-guided biopsy of a suspicious mass in the left breast at 2 o'clock 9 cm from nipple as well as of an enlarged axillary lymph node. EXAM: DIAGNOSTIC LEFT  MAMMOGRAM POST ULTRASOUND BIOPSY COMPARISON:  Previous exam(s). FINDINGS: Mammographic images were obtained following ultrasound guided biopsy of a suspicious mass in the left breast at 2 o'clock as well as biopsy of a suspicious axillary lymph node. A ribbon shaped biopsy marking is present, however located approximately 2.5 cm lateral and slightly superior to the mass. This is felt to be related to clip migration as a result of bleeding, however recommend both the clip and mass be removed at time of surgery. IMPRESSION: Ribbon shaped biopsy marking clip located approximately 2.5 cm lateral and slightly superior to the biopsied mass felt to be related to clip migration from bleeding. Recommend both the ribbon shaped biopsy marking clip and mass be removed at time of surgery. Final Assessment: Post Procedure Mammograms for Marker Placement Electronically Signed   By: Everlean Alstrom M.D.   On: 04/29/2016 17:06   [place]  Lt Breast Bx W Loc Dev 1st Lesion Img Bx Spec US Guide  Addendum Date: 05/03/2016   ADDENDUM REPORT: 05/03/2016 08:16 ADDENDUM: Pathology revealed INVASIVE MAMMARY CARCINOMA, HIGH GRADE, FAVOR DUCTAL CARCINOMA. 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 INVASIVE CARCINOMA METASTATIC TO LYMPH NODE(S) of the Left axilla. This was found to be concordant by Dr. Everlean Alstrom. Pathology results were discussed with the patient by telephone. The patient reported doing well after the biopsies with tenderness at the sites. Post biopsy instructions and care were reviewed and questions were answered. The patient was encouraged to call [facility] for any additional concerns. The patient was referred to [facility] on May 11, 2016. Pathology results reported by Terie Purser, RN on 05/03/2016. Electronically Signed   By: Everlean Alstrom M.D.   On: 05/03/2016 08:16   Result Date: 05/03/2016 CLINICAL DATA:  61 year old female with a suspicious mass in the left breast at the 2 o'clock position and suspicious lymph node in the left axilla. EXAM: ULTRASOUND GUIDED LEFT BREAST CORE NEEDLE BIOPSY COMPARISON:  Previous exam(s). FINDINGS: I met with the patient and we discussed the procedure of ultrasound-guided biopsy, including benefits and alternatives. We discussed the high likelihood of a successful procedure. We discussed the risks of the procedure, including infection, bleeding, tissue injury, clip migration, and inadequate sampling. Informed written consent was given. The usual time-out protocol was performed immediately prior to the procedure. SITE 1: LEFT BREAST 2 O'CLOCK MASS: Using sterile technique and 1% Lidocaine as local anesthetic, under direct ultrasound visualization, a 12 gauge spring-loaded device was used to perform biopsy of the mass in the left breast at the 2 o'clock position using a  lateral to medial approach. At the conclusion of the procedure a ribbon shaped tissue marker clip was deployed into the biopsy cavity. SITE 2: LEFT AXILLA LYMPH NODE: Using sterile technique and 1% Lidocaine as local anesthetic, under direct ultrasound visualization, a 14 gauge spring-loaded device was used to perform biopsy of the enlarged in round lymph node in the left axilla using a lateral to medial approach. At the conclusion of the procedure a spiral shaped HydroMARK tissue marker clip was deployed into the biopsy cavity. Follow up 2 view mammogram was performed and dictated separately. IMPRESSION: 1. Ultrasound-guided biopsy of the suspicious mass in the left breast at 2 o'clock, at site of ribbon shaped biopsy marking clip. 2. Ultrasound-guided biopsy of the suspicious lymph node in the left axilla, at site of spiral HydroMARK biopsy marking clip. Electronically Signed:  By: Everlean Alstrom M.D. On: 04/29/2016 16:38   [place] Lt Breast Bx W Loc Dev Ea Add Lesion Img Bx Spec US Guide  Addendum Date: 05/03/2016   ADDENDUM REPORT: 05/03/2016 08:16 ADDENDUM: Pathology revealed INVASIVE MAMMARY CARCINOMA, HIGH GRADE, FAVOR DUCTAL CARCINOMA. 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 INVASIVE CARCINOMA METASTATIC TO LYMPH NODE(S) of the Left axilla. This was found to be concordant by Dr. Everlean Alstrom. Pathology results were discussed with the patient by telephone. The patient reported doing well after the biopsies with tenderness at the sites. Post biopsy instructions and care were reviewed and questions were answered. The patient was encouraged to call [facility] [place] for any additional concerns. The patient was referred to [facility] on May 11, 2016. Pathology results reported by Terie Purser, RN on 05/03/2016. Electronically Signed   By: Everlean Alstrom M.D.   On: 05/03/2016 08:16   Result  Date: 05/03/2016 CLINICAL DATA:  61 year old female with a suspicious mass in the left breast at the 2 o'clock position and suspicious lymph node in the left axilla. EXAM: ULTRASOUND GUIDED LEFT BREAST CORE NEEDLE BIOPSY COMPARISON:  Previous exam(s). FINDINGS: I met with the patient and we discussed the procedure of ultrasound-guided biopsy, including benefits and alternatives. We discussed the high likelihood of a successful procedure. We discussed the risks of the procedure, including infection, bleeding, tissue injury, clip migration, and inadequate sampling. Informed written consent was given. The usual time-out protocol was performed immediately prior to the procedure. SITE 1: LEFT BREAST 2 O'CLOCK MASS: Using sterile technique and 1% Lidocaine as local anesthetic, under direct ultrasound visualization, a 12 gauge spring-loaded device was used to perform biopsy of the mass in the left breast at the 2 o'clock position using a lateral to medial approach. At the conclusion of the procedure a ribbon shaped tissue marker clip was deployed into the biopsy cavity. SITE 2: LEFT AXILLA LYMPH NODE: Using sterile technique and 1% Lidocaine as local anesthetic, under direct ultrasound visualization, a 14 gauge spring-loaded device was used to perform biopsy of the enlarged in round lymph node in the left axilla using a lateral to medial approach. At the conclusion of the procedure a spiral shaped HydroMARK tissue marker clip was deployed into the biopsy cavity. Follow up 2 view mammogram was performed and dictated separately. IMPRESSION: 1. Ultrasound-guided biopsy of the suspicious mass in the left breast at 2 o'clock, at site of ribbon shaped biopsy marking clip. 2. Ultrasound-guided biopsy of the suspicious lymph node in the left axilla, at site of spiral HydroMARK biopsy marking clip. Electronically Signed: By: Everlean Alstrom M.D. On: 04/29/2016 16:38       IMPRESSION/PLAN: 1. Stage IIB, [id number], ER/PR  positive, grade 3, invasive ductal carcinoma of the left breast. Dr. Lisbeth Renshaw discusses the pathology findings and reviews the nature of invasive breast disease. The consensus from the breast conference include breast conservation with lumpectomy with sentinel mapping. She will begin neoadjuvant antiestrogen. She will move forward with an MRI to rule out additional areas of multifocal disease prior to surgery. She may need to have additional clip placement as well. Her tumor will be tested for mammaprint to determine a role for chemotherapy. Radiotherapy would follow surgery unless she needed adjuvant chemotherapy, in which case we would follow chemotherapy.We discussed the role for adjuvant external radiotherapy to the breast followed by antiestrogen therapy with aromatase inhibitor. We discussed the  risks, benefits, short, and long term effects of radiotherapy, and the patient is interested in proceeding. Dr. Lisbeth Renshaw discusses the delivery and logistics of radiotherapy. Dr. Lisbeth Renshaw reviews that he would recommend a course of 4-6 1/2 weeks of radiotherapy, and we would use breath hold technique to avoid exposure to the heart as her disease is left sided. We will see her back about 2 weeks after surgery to move forward with the simulation and planning process and anticipate starting radiotherapy about 4 weeks after surgery.  2. Hypertension. The patient is asymptomatic and her BP was repeated and was 135/100. The patient will follow up with her PCP for further evaluation of this in two weeks.   The above documentation reflects my direct findings during this shared patient visit. Please see the separate note by Dr. Lisbeth Renshaw on this date for the remainder of the patient's plan of care.    Carola Rhine, PAC  This document serves as a record of services personally performed by Shona Simpson, PA-C and Kyung Rudd, MD. It was created on their behalf by Bethann Humble, a trained medical scribe. The creation of this  record is based on the scribe's personal observations and the provider's statements to them. This document has been checked and approved by the attending provider.

## 2016-05-12 ENCOUNTER — Encounter: Payer: Self-pay | Admitting: General Practice

## 2016-05-12 NOTE — Progress Notes (Signed)
[place] Psychosocial Distress Screening [place] by phone following Breast Multidisciplinary Clinic to introduce [place] team/resources, reviewing distress screen per protocol.  The patient scored a [unspecified] on the Psychosocial Distress Thermometer which indicates [unspecified] distress. Also assessed for distress and other psychosocial needs.   ON[facility] DISTRESS SCREENING 05/12/2016  Screening Type Initial Screening  Distress experienced in past week (1-10) (No Data)  Emotional problem type Nervousness/Anxiety;Adjusting to illness  Information Concerns Type Lack of info about diagnosis;Lack of info about treatment;Lack of info about complementary therapy choices;Lack of info about maintaining fitness  Physical Problem type Sleep/insomnia;Constipation/diarrhea;Other (comment)  Referral to support programs Yes    Pt was very appreciative of [place] process and of f/u call, taking down my number.  Per pt, no concerns at this time, but she plans to reach out as needed desired.  Follow up needed: No. Pt has full packet of [place], but please also page if immediate needs arise. Thank you.   [place], [place], [facility] Pager [phone] Voicemail [phone]

## 2016-05-15 ENCOUNTER — Other Ambulatory Visit: Payer: Self-pay | Admitting: Radiation Oncology

## 2016-05-17 ENCOUNTER — Ambulatory Visit (HOSPITAL_COMMUNITY)
Admission: RE | Admit: 2016-05-17 | Discharge: 2016-05-17 | Disposition: A | Discharge status: Home / Self Care | Payer: BLUE CROSS/BLUE SHIELD | Source: Ambulatory Visit | Attending: General Surgery | Admitting: General Surgery

## 2016-05-17 DIAGNOSIS — C50412 Malignant neoplasm of upper-outer quadrant of left female breast: Secondary | ICD-10-CM | POA: Diagnosis not present

## 2016-05-17 DIAGNOSIS — Z170 Estrogen receptor positive status [ER+]: Secondary | ICD-10-CM | POA: Insufficient documentation

## 2016-05-17 DIAGNOSIS — C50912 Malignant neoplasm of unspecified site of left female breast: Secondary | ICD-10-CM | POA: Diagnosis not present

## 2016-05-17 MED ORDER — GADOBENATE DIMEGLUMINE 529 MG/ML IV SOLN
20.0000 mL | Freq: Once | INTRAVENOUS | Status: AC | PRN
  Administered 2016-05-17: 18 mL via INTRAVENOUS

## 2016-05-18 ENCOUNTER — Telehealth: Payer: Self-pay | Admitting: *Deleted

## 2016-05-18 ENCOUNTER — Other Ambulatory Visit: Payer: Self-pay | Admitting: General Surgery

## 2016-05-18 ENCOUNTER — Encounter (HOSPITAL_COMMUNITY): Payer: Self-pay

## 2016-05-18 DIAGNOSIS — R928 Other abnormal and inconclusive findings on diagnostic imaging of breast: Secondary | ICD-10-CM

## 2016-05-18 NOTE — Telephone Encounter (Signed)
Received Mammaprint results of HIGH RISK. Informed physician team.

## 2016-05-20 ENCOUNTER — Ambulatory Visit: Payer: BLUE CROSS/BLUE SHIELD

## 2016-05-20 ENCOUNTER — Other Ambulatory Visit: Payer: Self-pay | Admitting: General Surgery

## 2016-05-20 DIAGNOSIS — Z853 Personal history of malignant neoplasm of breast: Secondary | ICD-10-CM

## 2016-05-20 DIAGNOSIS — N63 Unspecified lump in unspecified breast: Secondary | ICD-10-CM

## 2016-05-23 ENCOUNTER — Other Ambulatory Visit: Payer: Self-pay | Admitting: General Surgery

## 2016-05-23 NOTE — Progress Notes (Signed)
CC: Follow up chronic illnesses  HPI: Tanya [name] is a 61 y.o. 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  Plan for breast cancer per Rad-Onc chart review is adjuvent radiation after either lumpectomy or mastectomy.  HYPERTENSION BP noted to be elevated by rad onc but subsequently improved and appears normal by today's check. Blood pressure range-110-140/90s for the past week, patient measured twice daily  Chest pain, palpitations- Denies       Dyspnea- Denies Medications: Losartan 50 mg Qd Compliance- Reports 100% compliance  Lightheadedness,Syncope- Denies    Edema- Denies  PRE-DIABETES Disease Monitoring: HgbA1c 6.3  Polyuria/phagia/dipsia- Denies       Visual problems- Denies Medications: Patient is not currently on medications. She endorses recent lifestyle changes, and incorporating green smoothies with Kale and flaxseeds into her diet. She also endorses recent exercise via walking 2 miles for 3 days last week. She would like to try lifestyle interventions prior to starting medications.   HYPERLIPIDEMIA See symptoms for Hypertension Medications: Pravastatin 40 mg Qd Compliance- reports 100% compliance  Right upper quadrant pain- denies   Muscle aches- denies  Monitoring Labs and Parameters Last A1C:  Lab Results  Component Value Date   HGBA1C 6.3 05/24/2016    Last Lipid:     Component Value Date/Time   CHOL 173 01/26/2016 1629   HDL 57 01/26/2016 1629   LDLDIRECT 88 05/26/2009 2032    Last Bmet  Potassium  Date Value Ref Range Status  05/11/2016 4.0 3.5 - 5.1 mEq/L Final   Sodium  Date Value Ref Range Status  05/11/2016 140 136 - 145 mEq/L Final   Creatinine  Date Value Ref Range Status  05/11/2016 0.9 0.6 - 1.1 mg/dL Final      Last BPs:  BP Readings from Last 3 Encounters:  05/24/16 132/90  05/11/16 (!) 135/100    05/11/16 (!) 183/94    Review of Symptoms:  See HPI for ROS.   CC, SH/smoking status, and VS noted.  Objective: BP 132/90   Pulse 90   Temp 98.8 F (37.1 C) (Oral)   Ht 5\' 3"  (1.6 m)   Wt 89.5 kg (197 lb 6.4 oz)   SpO2 99%   BMI 34.97 kg/m  GEN: NAD, alert, cooperative, and pleasant. NECK: full ROM, no thyromegaly RESPIRATORY: clear to auscultation bilaterally with no wheezes, rhonchi or rales, good effort CV: RRR, no m/r/g, no peripheral edema GI: soft, non-tender, non-distended SKIN: warm and dry, no rashes or lesions NEURO: II-XII grossly intact, normal gait, peripheral sensation intact PSYCH: AAOx3, appropriate affect   Assessment and plan:  Hyperlipidemia - no red flags - continue statin - consider repeat fasting lipids ~01/26/2017 - ASCVD 6.1%; <10% so will not initiate ASA at this time. Continue to monitor.  HYPERTENSION, BENIGN ESSENTIAL BP elevated at recent rate on current visit, however at goal today. Patient has a paper where she recorded a.m. and p.m. blood pressure over the past week, and they are WNL. - No red flags - Continue Losartan 50 g daily - Return precautions provided  Prediabetes - First attempt lifestyle changes - Offered nutrition and dietetics referral, however patient would like to wait until all of her appointments with the cancer doctors have settle down - hold off starting metformin - Recheck A1c in 3 months, ~08/26/16   Orders Placed This Encounter  Procedures  . HgB A1c  Everrett Coombe, MD,MS,  PGY1 05/24/2016 4:46 PM

## 2016-05-24 ENCOUNTER — Ambulatory Visit (INDEPENDENT_AMBULATORY_CARE_PROVIDER_SITE_OTHER): Payer: BLUE CROSS/BLUE SHIELD | Admitting: Student in an Organized Health Care Education/Training Program

## 2016-05-24 ENCOUNTER — Encounter: Payer: Self-pay | Admitting: Student in an Organized Health Care Education/Training Program

## 2016-05-24 VITALS — BP 132/90 | HR 90 | Temp 98.8°F | Ht 63.0 in | Wt 197.4 lb

## 2016-05-24 DIAGNOSIS — I10 Essential (primary) hypertension: Secondary | ICD-10-CM | POA: Diagnosis not present

## 2016-05-24 DIAGNOSIS — R7303 Prediabetes: Secondary | ICD-10-CM | POA: Diagnosis not present

## 2016-05-24 DIAGNOSIS — R7309 Other abnormal glucose: Secondary | ICD-10-CM | POA: Diagnosis not present

## 2016-05-24 DIAGNOSIS — E782 Mixed hyperlipidemia: Secondary | ICD-10-CM

## 2016-05-24 LAB — POCT GLYCOSYLATED HEMOGLOBIN (HGB A1C): HEMOGLOBIN A1C: 6.3

## 2016-05-24 NOTE — Assessment & Plan Note (Addendum)
-   no red flags - continue statin - consider repeat fasting lipids ~01/26/2017 - ASCVD 6.1%; <10% so will not initiate ASA at this time. Continue to monitor.

## 2016-05-24 NOTE — Assessment & Plan Note (Signed)
BP elevated at recent rate on current visit, however at goal today. Patient has a paper where she recorded a.m. and p.m. blood pressure over the past week, and they are WNL. - No red flags - Continue Losartan 50 g daily - Return precautions provided

## 2016-05-24 NOTE — Assessment & Plan Note (Addendum)
-   First attempt lifestyle changes - Offered nutrition and dietetics referral, however patient would like to wait until all of her appointments with the cancer doctors have settle down - hold off starting metformin - Recheck A1c in 3 months, ~08/26/16

## 2016-05-24 NOTE — Patient Instructions (Signed)
It was a pleasure seeing you today in our clinic. Today we discussed blood pressure and pre-diabetes. Here is the treatment plan we have discussed and agreed upon together:  Pre-Diabetes You have pre-diabetes. Your HbA1c is 6.3. Your goal is to have an A1c < 6.5 Homework: We discussed starting medication or trying positive lifestyle changes. I think it is reasonable to work on your diet and exercise, and we can recheck your HbA1c in 3 months.  Continue with your healthy smoothies and walking for exercise.  Your goal is to walk 30 minutes 3 times per week at a pace that gets your heart rate up.   High Blood Pressure Her blood pressure looks normal today, and it was normal on all of your BP reads for the last week.  Please continue to take your losartan every day as prescribed. Your goal blood pressure is 140/90.  - If you develop headaches or changes in vision, check your blood pressure, and reach out to our office.   Come back to see [place] in: 3 months  Our clinic's number is [phone]. Please call with questions or concerns about what we discussed today.  Be well, Dr. Burr Medico

## 2016-05-25 ENCOUNTER — Ambulatory Visit
Admission: RE | Admit: 2016-05-25 | Discharge: 2016-05-25 | Disposition: A | Discharge status: Home / Self Care | Payer: BLUE CROSS/BLUE SHIELD | Source: Ambulatory Visit | Attending: General Surgery | Admitting: General Surgery

## 2016-05-25 ENCOUNTER — Ambulatory Visit (HOSPITAL_BASED_OUTPATIENT_CLINIC_OR_DEPARTMENT_OTHER): Payer: BLUE CROSS/BLUE SHIELD | Admitting: Oncology

## 2016-05-25 VITALS — BP 154/88 | HR 86 | Temp 98.4°F | Resp 18 | Ht 63.0 in | Wt 197.1 lb

## 2016-05-25 DIAGNOSIS — R928 Other abnormal and inconclusive findings on diagnostic imaging of breast: Secondary | ICD-10-CM

## 2016-05-25 DIAGNOSIS — C773 Secondary and unspecified malignant neoplasm of axilla and upper limb lymph nodes: Secondary | ICD-10-CM | POA: Diagnosis not present

## 2016-05-25 DIAGNOSIS — Z170 Estrogen receptor positive status [ER+]: Secondary | ICD-10-CM

## 2016-05-25 DIAGNOSIS — Z853 Personal history of malignant neoplasm of breast: Secondary | ICD-10-CM

## 2016-05-25 DIAGNOSIS — C50212 Malignant neoplasm of upper-inner quadrant of left female breast: Secondary | ICD-10-CM

## 2016-05-25 DIAGNOSIS — C50412 Malignant neoplasm of upper-outer quadrant of left female breast: Secondary | ICD-10-CM

## 2016-05-25 DIAGNOSIS — N6489 Other specified disorders of breast: Secondary | ICD-10-CM | POA: Diagnosis not present

## 2016-05-25 DIAGNOSIS — C50912 Malignant neoplasm of unspecified site of left female breast: Secondary | ICD-10-CM | POA: Diagnosis not present

## 2016-05-25 HISTORY — PX: BREAST BIOPSY: SHX20

## 2016-05-25 MED ORDER — GADOBENATE DIMEGLUMINE 529 MG/ML IV SOLN
20.0000 mL | Freq: Once | INTRAVENOUS | Status: AC | PRN
  Administered 2016-05-25: 20 mL via INTRAVENOUS

## 2016-05-25 MED ORDER — LORAZEPAM 0.5 MG PO TABS: 0.5000 mg | ORAL_TABLET | Freq: Every evening | ORAL | 0 refills | Status: DC | PRN

## 2016-05-25 NOTE — Progress Notes (Signed)
START ON PATHWAY REGIMEN - Breast   Dose-Dense AC q14 days:   A cycle is every 14 days:     Doxorubicin      Cyclophosphamide      Pegfilgrastim   **Always confirm dose/schedule in your pharmacy ordering system**    Paclitaxel 80 mg/m2 Weekly:   Administer weekly:     Paclitaxel   **Always confirm dose/schedule in your pharmacy ordering system**    Patient Characteristics: Neoadjuvant Chemotherapy, HER2/neu Negative/Unknown/Equivocal, ER Positive AJCC Stage Grouping: IIA Current Disease Status: No Distant Mets or Local Recurrence AJCC M Stage: 0 ER Status: Positive (+) AJCC N Stage: 1a AJCC T Stage: 1c HER2/neu: Negative (-) PR Status: Positive (+)  Intent of Therapy: Curative Intent, Discussed with Patient 

## 2016-05-25 NOTE — Addendum Note (Signed)
Addended by: Chauncey Cruel on: 05/25/2016 05:59 PM   Modules accepted: Orders

## 2016-05-25 NOTE — Progress Notes (Signed)
[place]  [phone] [phone]     ID: Tanya [name] DOB: [id number]  MR#: [id number]  ION#:[id number]  Patient Care Team: Everrett Coombe, MD as PCP - General Rolm Bookbinder, MD as Consulting Physician (General Surgery) Chauncey Cruel, MD as Consulting Physician (Oncology) Kyung Rudd, MD as Consulting Physician (Radiation Oncology) Chauncey Cruel, MD OTHER MD:  CHIEF COMPLAINT: Estrogen receptor positive breast cancer  CURRENT TREATMENT: Neoadjuvant chemotherapy   BREAST CANCER HISTORY: From the original intake note:  Tanya [name] had routine bilateral screening mammography at the [facility] 04/19/2016. This showed a possible area of the symmetry in the left breast and a prominent left axillary lymph nodes. On 04/28/2016 she underwent diagnostic left mammography with tomography and left breast ultrasonography. This showed the breast density to be category B. There was an obscured mass in the upper outer left breast and an enlarged left axillary lymph node. On exam there was focal thickening at the 2:00 position of the left breast 9 cm from the nipple. Targeted ultrasonography confirmed a 1.8 cm hypoechoic mass with a second adjacent 1.1 cm mass both at the 2:00 position of the left breast 9 cm from the nipple. The conglomerate area measured 3.5 cm. There was also an enlarged left axillary lymph node with loss of fatty hilum measuring 1.8 cm.  On family 16 2018 the patient underwent biopsy of the larger left breast mass in question and the suspicious left axillary lymph node. 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  The patient's subsequent history is as detailed below  INTERVAL HISTORY: Tanya [name] returns today for  follow-up of her estrogen receptor positive breast cancer accompanied by her 2 daughters. Tanya [name] is on anastrozole, with good tolerance.Hot flashes and vaginal dryness are not a major issue. She never developed the arthralgias or myalgias that many patients can experience on this medication. She obtains it at a good price.  Since her last visit here we obtain the results of the Mammaprint which show her tumor to be high risk. This means she will need chemotherapy. We also had a baseline MRI, which showed A poorly defined 1.2 cm area of concern in the lateral posterior left breast. This was biopsied today, with results pending.   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 Her daughters are taking her for walks and she is appreciative of the attention but does not enjoy the exercise that much. Aside from this a detailed review of systems today was stable   PAST MEDICAL HISTORY: Past Medical History:  Diagnosis Date  . ANXIETY   . Dyspnea on exertion   . HYPERLIPIDEMIA   . HYPERTENSION, BENIGN ESSENTIAL   . Malignant neoplasm of upper-outer quadrant of left breast in female, estrogen receptor positive ([place]) 05/04/2016  . OBESITY     PAST SURGICAL HISTORY: Past Surgical History:  Procedure Laterality Date  . CESAREAN SECTION    . TUBAL LIGATION      FAMILY HISTORY Family History  Problem Relation Age of Onset  . Breast cancer Mother 66  The patient's father had a history of prostate cancer. He died at age 32. The patient's mother died at age 44 from unclear causes. She had a history of breast  cancer diagnosed in her 9s. The patient had 4 brothers, 2 sisters. There is no other history of breast or ovarian cancer in the family to the patient's knowledge  GYNECOLOGIC HISTORY:  No LMP recorded. Patient is postmenopausal. Menarche age 39, first live birth age 75, the patient is [name].  She stopped having periods approximately 2006. She did not use hormone replacement. She used oral contraceptives briefly and remotely, with no complications.  SOCIAL HISTORY:  The patient is originally from [name]. 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 The patient's husband [name] was also a professor at [employer], where he taught agriculture oh research. Son Tanya [name] works in Land, son Tanya [name] works for [employer], daughter Tanya [name] is a Education officer, museum, and daughter Tanya [name] is also a Education officer, museum, all living in [place]. The patient has 3 grandchildren. She attends [employer]    ADVANCED DIRECTIVES: Not in place   HEALTH MAINTENANCE: Social History  Substance Use Topics  . Smoking status: Never Smoker  . Smokeless tobacco: Never Used  . Alcohol use No     Colonoscopy:Never  PAP: November 2017  Bone density: Never   Allergies  Allergen Reactions  . Camoquin [Amodiaquine] Itching    Current Outpatient Prescriptions  Medication Sig Dispense Refill  . anastrozole (ARIMIDEX) 1 MG tablet Take 1 tablet (1 mg total) by mouth daily. 90 tablet 4  . LORazepam (ATIVAN) 0.5 MG tablet Take 1 tablet (0.5 mg total) by mouth at bedtime as needed for anxiety. 30 tablet 0  . losartan (COZAAR) 50 MG tablet Take 1 tablet (50 mg total) by mouth daily. 90 tablet 2  . pravastatin (PRAVACHOL) 40 MG tablet Take 1 tablet (40 mg total) by mouth at bedtime. 90 tablet 2   No current facility-administered medications for this visit.     OBJECTIVE: Middle-aged African-American woman Who appears stated age  61:   05/25/16 1559  BP: (!) 154/88  Pulse: 86  Resp: 18  Temp: 98.4 F (36.9 C)     Body mass index is 34.91 kg/m.    ECOG FS:0 - Asymptomatic  Sclerae unicteric, pupils round and equal Oropharynx clear and moist-- no thrush or other lesions No cervical or supraclavicular adenopathy Lungs no rales or rhonchi Heart regular rate and rhythm Abd soft, nontender,  positive bowel sounds MSK no focal spinal tenderness, no upper extremity lymphedema Neuro: nonfocal, well oriented, appropriate affect Breasts: There is a mild ecchymosis associated with the new biopsy in the left breast. Otherwise breast exam is unremarkable bilaterally in both axillae are benign.  LAB RESULTS:  CMP     Component Value Date/Time   NA 140 05/11/2016 1233   K 4.0 05/11/2016 1233   CL 102 01/26/2016 1629   CO2 26 05/11/2016 1233   GLUCOSE 154 (H) 05/11/2016 1233   BUN 7.7 05/11/2016 1233   CREATININE 0.9 05/11/2016 1233   CALCIUM 9.6 05/11/2016 1233   PROT 7.6 05/11/2016 1233   ALBUMIN 4.0 05/11/2016 1233   AST 15 05/11/2016 1233   ALT 16 05/11/2016 1233   ALKPHOS 74 05/11/2016 1233   BILITOT 0.53 05/11/2016 1233    INo results found for: SPEP, UPEP  Lab Results  Component Value Date   WBC 6.6 05/11/2016   NEUTROABS 3.6 05/11/2016   HGB 13.7 05/11/2016   HCT 41.8 05/11/2016   MCV 77.4 (L) 05/11/2016   PLT 189 05/11/2016      Chemistry      Component Value Date/Time  NA 140 05/11/2016 1233   K 4.0 05/11/2016 1233   CL 102 01/26/2016 1629   CO2 26 05/11/2016 1233   BUN 7.7 05/11/2016 1233   CREATININE 0.9 05/11/2016 1233      Component Value Date/Time   CALCIUM 9.6 05/11/2016 1233   ALKPHOS 74 05/11/2016 1233   AST 15 05/11/2016 1233   ALT 16 05/11/2016 1233   BILITOT 0.53 05/11/2016 1233       No results found for: LABCA2  No components found for: LABCA125  No results for input(s): INR in the last 168 hours.  Urinalysis    Component Value Date/Time   COLORURINE yellow 06/01/2006 1116   APPEARANCEUR Clear 06/01/2006 1116   LABSPEC 1.010 06/01/2006 1116   PHURINE 6.5 06/01/2006 1116   HGBUR negative 06/01/2006 1116   BILIRUBINUR negative 06/01/2006 1116   UROBILINOGEN 0.2 06/01/2006 1116   NITRITE negative 06/01/2006 1116     STUDIES: Mr Breast Bilateral W Wo Contrast  Result Date: 05/17/2016 CLINICAL DATA:  Patient with  recent diagnosis left breast carcinoma. EXAM: BILATERAL BREAST MRI WITH AND WITHOUT CONTRAST TECHNIQUE: Multiplanar, multisequence MR images of both breasts were obtained prior to and following the intravenous administration of 18 ml of MultiHance. THREE-DIMENSIONAL MR IMAGE RENDERING ON INDEPENDENT WORKSTATION: Three-dimensional MR images were rendered by post-processing of the original MR data on an independent workstation. The three-dimensional MR images were interpreted, and findings are reported in the following complete MRI report for this study. Three dimensional images were evaluated at the independent DynaCad workstation COMPARISON:  Previous exam(s). FINDINGS: Breast composition: a. Almost entirely fat. Background parenchymal enhancement: Mild Right breast: No mass or abnormal enhancement. Left breast: Within the lateral aspect of the left breast posterior depth there is a 3.7 x 1.2 x 1.8 cm lobular enhancing mass compatible a biopsy-proven carcinoma. Within the posterolateral left breast just lateral to the pectoralis muscle there is a 1.2 cm area of linear non mass enhancement. This is located approximately 2 cm posterior and slightly medial from the previously biopsied mass. Lymph nodes: There are multiple cortically thickened left axillary lymph nodes measuring up to 1.5 cm in size. Ancillary findings:  None. IMPRESSION: Indeterminate 1.2 cm area of linear non mass enhancement within the far posterolateral left breast located approximately 2 cm posterior and medial to the previously biopsied left breast carcinoma. Enhancing mass posterolateral left breast compatible with biopsied carcinoma. Cortically thickened left axillary nodes compatible with metastatic adenopathy. RECOMMENDATION: MRI guided core needle biopsy linear non mass enhancement posterolateral left breast. BI-RADS CATEGORY  4: Suspicious. Electronically Signed   By: Lovey Newcomer M.D.   On: 05/17/2016 15:19   US Breast Ltd Uni Left Inc  Axilla  Result Date: 04/28/2016 CLINICAL DATA:  61 year old female for further evaluation of left breast asymmetry and possible enlarged left axillary lymph node identified on screening mammogram. EXAM: 2D DIGITAL DIAGNOSTIC LEFT MAMMOGRAM WITH CAD AND ADJUNCT TOMO ULTRASOUND LEFT BREAST COMPARISON:  Previous exam(s). ACR Breast Density Category b: There are scattered areas of fibroglandular density. FINDINGS: 2D and 3D full field views of the left breast demonstrate a an obscured mass within the upper-outer left breast and an enlarged left axillary lymph node. Mammographic images were processed with CAD. On physical exam, focal thickening is identified at the 2 o'clock position of the left breast 9 cm from the nipple. Targeted ultrasound is performed, showing a 1.3 x 0.9 x 1.8 cm irregular hypoechoic mass with adjacent 1.1 cm mass/ enlarged intraparenchymal lymph node at  the 2 o'clock position of the left breast 9-11 cm from the nipple. The conglomerate area including this mass and possible enlarged intraparenchymal lymph node measures 3.5 cm in greatest longitudinal dimension. An enlarged left axillary lymph node with loss of fatty hilum is noted measuring 1.8 cm in greatest diameter. IMPRESSION: Highly suspicious mass within the upper-outer left breast and enlarged left axillary lymph node. Tissue sampling is recommended. RECOMMENDATION: Ultrasound-guided left breast and left axillary biopsies, which has been scheduled for 04/29/2016. I have discussed the findings and recommendations with the patient. Results were also provided in writing at the conclusion of the visit. If applicable, a reminder letter will be sent to the patient regarding the next appointment. BI-RADS CATEGORY  4: Suspicious. Electronically Signed   By: Margarette Canada M.D.   On: 04/28/2016 16:30   Mm Diag Breast Tomo Uni Left  Result Date: 04/28/2016 CLINICAL DATA:  61 year old female for further evaluation of left breast asymmetry and  possible enlarged left axillary lymph node identified on screening mammogram. EXAM: 2D DIGITAL DIAGNOSTIC LEFT MAMMOGRAM WITH CAD AND ADJUNCT TOMO ULTRASOUND LEFT BREAST COMPARISON:  Previous exam(s). ACR Breast Density Category b: There are scattered areas of fibroglandular density. FINDINGS: 2D and 3D full field views of the left breast demonstrate a an obscured mass within the upper-outer left breast and an enlarged left axillary lymph node. Mammographic images were processed with CAD. On physical exam, focal thickening is identified at the 2 o'clock position of the left breast 9 cm from the nipple. Targeted ultrasound is performed, showing a 1.3 x 0.9 x 1.8 cm irregular hypoechoic mass with adjacent 1.1 cm mass/ enlarged intraparenchymal lymph node at the 2 o'clock position of the left breast 9-11 cm from the nipple. The conglomerate area including this mass and possible enlarged intraparenchymal lymph node measures 3.5 cm in greatest longitudinal dimension. An enlarged left axillary lymph node with loss of fatty hilum is noted measuring 1.8 cm in greatest diameter. IMPRESSION: Highly suspicious mass within the upper-outer left breast and enlarged left axillary lymph node. Tissue sampling is recommended. RECOMMENDATION: Ultrasound-guided left breast and left axillary biopsies, which has been scheduled for 04/29/2016. I have discussed the findings and recommendations with the patient. Results were also provided in writing at the conclusion of the visit. If applicable, a reminder letter will be sent to the patient regarding the next appointment. BI-RADS CATEGORY  4: Suspicious. Electronically Signed   By: Margarette Canada M.D.   On: 04/28/2016 16:30   Mm Clip Placement Left  Result Date: 05/25/2016 CLINICAL DATA:  61 year old female -evaluate clip placement following MR guided left breast biopsy and evaluate placement of new clip directly into the biopsy-proven left breast cancer. EXAM: DIAGNOSTIC LEFT MAMMOGRAM  POST MRI BIOPSY COMPARISON:  Previous exam(s). FINDINGS: Mammographic images were obtained following MR guided biopsy of linear enhancement within the lateral far posterior left breast and placement of new clip directly within the biopsy-proven left breast cancer. The spiral shaped HydroMARK clip is identified far posteriorly on the lateral view corresponding to the MR area of biopsy and is in satisfactory position. The coil shaped clip lies directly in the patient's biopsy-proven left breast cancer and is in satisfactory position. The previously placed ribbon shaped clip at the site of biopsy lies 2 cm lateral to the coil shaped clip. IMPRESSION: Satisfactory position of biopsy clip following MR guided left breast biopsy. Satisfactory position of coil shaped clip directly within the patient's biopsy-proven left breast cancer. Final Assessment: Post Procedure Mammograms  for Marker Placement Electronically Signed   By: Margarette Canada M.D.   On: 05/25/2016 12:07   Mm Clip Placement Left  Result Date: 04/29/2016 CLINICAL DATA:  Post ultrasound-guided biopsy of a suspicious mass in the left breast at 2 o'clock 9 cm from nipple as well as of an enlarged axillary lymph node. EXAM: DIAGNOSTIC LEFT MAMMOGRAM POST ULTRASOUND BIOPSY COMPARISON:  Previous exam(s). FINDINGS: Mammographic images were obtained following ultrasound guided biopsy of a suspicious mass in the left breast at 2 o'clock as well as biopsy of a suspicious axillary lymph node. A ribbon shaped biopsy marking is present, however located approximately 2.5 cm lateral and slightly superior to the mass. This is felt to be related to clip migration as a result of bleeding, however recommend both the clip and mass be removed at time of surgery. IMPRESSION: Ribbon shaped biopsy marking clip located approximately 2.5 cm lateral and slightly superior to the biopsied mass felt to be related to clip migration from bleeding. Recommend both the ribbon shaped biopsy  marking clip and mass be removed at time of surgery. Final Assessment: Post Procedure Mammograms for Marker Placement Electronically Signed   By: Everlean Alstrom M.D.   On: 04/29/2016 17:06   [place] Lt Breast Bx W Loc Dev 1st Lesion Img Bx Spec US Guide  Addendum Date: 05/03/2016   ADDENDUM REPORT: 05/03/2016 08:16 ADDENDUM: Pathology revealed INVASIVE MAMMARY CARCINOMA, HIGH GRADE, FAVOR DUCTAL CARCINOMA. 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 INVASIVE CARCINOMA METASTATIC TO LYMPH NODE(S) of the Left axilla. This was found to be concordant by Dr. Everlean Alstrom. Pathology results were discussed with the patient by telephone. The patient reported doing well after the biopsies with tenderness at the sites. Post biopsy instructions and care were reviewed and questions were answered. The patient was encouraged to call [facility] for any additional concerns. The patient was referred to [facility] at [facility] on May 11, 2016. Pathology results reported by Terie Purser, RN on 05/03/2016. Electronically Signed   By: Everlean Alstrom M.D.   On: 05/03/2016 08:16   Result Date: 05/03/2016 CLINICAL DATA:  61 year old female with a suspicious mass in the left breast at the 2 o'clock position and suspicious lymph node in the left axilla. EXAM: ULTRASOUND GUIDED LEFT BREAST CORE NEEDLE BIOPSY COMPARISON:  Previous exam(s). FINDINGS: I met with the patient and we discussed the procedure of ultrasound-guided biopsy, including benefits and alternatives. We discussed the high likelihood of a successful procedure. We discussed the risks of the procedure, including infection, bleeding, tissue injury, clip migration, and inadequate sampling. Informed written consent was given. The usual time-out protocol was performed immediately prior to the procedure. SITE 1: LEFT BREAST 2 O'CLOCK MASS: Using sterile technique and 1%  Lidocaine as local anesthetic, under direct ultrasound visualization, a 12 gauge spring-loaded device was used to perform biopsy of the mass in the left breast at the 2 o'clock position using a lateral to medial approach. At the conclusion of the procedure a ribbon shaped tissue marker clip was deployed into the biopsy cavity. SITE 2: LEFT AXILLA LYMPH NODE: Using sterile technique and 1% Lidocaine as local anesthetic, under direct ultrasound visualization, a 14 gauge spring-loaded device was used to perform biopsy of the enlarged in round lymph node in the left axilla using a lateral to medial approach. At the conclusion of the procedure a spiral shaped HydroMARK tissue marker clip was  deployed into the biopsy cavity. Follow up 2 view mammogram was performed and dictated separately. IMPRESSION: 1. Ultrasound-guided biopsy of the suspicious mass in the left breast at 2 o'clock, at site of ribbon shaped biopsy marking clip. 2. Ultrasound-guided biopsy of the suspicious lymph node in the left axilla, at site of spiral HydroMARK biopsy marking clip. Electronically Signed: By: Everlean Alstrom M.D. On: 04/29/2016 16:38   [place] Lt Breast Bx W Loc Dev Ea Add Lesion Img Bx Spec US Guide  Addendum Date: 05/03/2016   ADDENDUM REPORT: 05/03/2016 08:16 ADDENDUM: Pathology revealed INVASIVE MAMMARY CARCINOMA, HIGH GRADE, FAVOR DUCTAL CARCINOMA. 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 INVASIVE CARCINOMA METASTATIC TO LYMPH NODE(S) of the Left axilla. This was found to be concordant by Dr. Everlean Alstrom. Pathology results were discussed with the patient by telephone. The patient reported doing well after the biopsies with tenderness at the sites. Post biopsy instructions and care were reviewed and questions were answered. The patient was encouraged to call [facility] for any additional concerns. The patient was referred to [facility] at [facility] on May 11, 2016. Pathology results reported by Terie Purser, RN on 05/03/2016. Electronically Signed   By: Everlean Alstrom M.D.   On: 05/03/2016 08:16   Result Date: 05/03/2016 CLINICAL DATA:  61 year old female with a suspicious mass in the left breast at the 2 o'clock position and suspicious lymph node in the left axilla. EXAM: ULTRASOUND GUIDED LEFT BREAST CORE NEEDLE BIOPSY COMPARISON:  Previous exam(s). FINDINGS: I met with the patient and we discussed the procedure of ultrasound-guided biopsy, including benefits and alternatives. We discussed the high likelihood of a successful procedure. We discussed the risks of the procedure, including infection, bleeding, tissue injury, clip migration, and inadequate sampling. Informed written consent was given. The usual time-out protocol was performed immediately prior to the procedure. SITE 1: LEFT BREAST 2 O'CLOCK MASS: Using sterile technique and 1% Lidocaine as local anesthetic, under direct ultrasound visualization, a 12 gauge spring-loaded device was used to perform biopsy of the mass in the left breast at the 2 o'clock position using a lateral to medial approach. At the conclusion of the procedure a ribbon shaped tissue marker clip was deployed into the biopsy cavity. SITE 2: LEFT AXILLA LYMPH NODE: Using sterile technique and 1% Lidocaine as local anesthetic, under direct ultrasound visualization, a 14 gauge spring-loaded device was used to perform biopsy of the enlarged in round lymph node in the left axilla using a lateral to medial approach. At the conclusion of the procedure a spiral shaped HydroMARK tissue marker clip was deployed into the biopsy cavity. Follow up 2 view mammogram was performed and dictated separately. IMPRESSION: 1. Ultrasound-guided biopsy of the suspicious mass in the left breast at 2 o'clock, at site of ribbon shaped biopsy marking clip. 2. Ultrasound-guided biopsy of the suspicious lymph node in the  left axilla, at site of spiral HydroMARK biopsy marking clip. Electronically Signed: By: Everlean Alstrom M.D. On: 04/29/2016 16:38   Mr Aundra Millet Breast Bx Johnella Moloney Dev 1st Lesion Image Bx Spec Mr Guide  Result Date: 05/25/2016 CLINICAL DATA:  61 year old female for tissue sampling of abnormal 1.2 cm linear enhancement within the lateral far posterior left breast adjacent to the pectoralis muscle. Recently diagnosed left breast carcinoma. EXAM: MRI GUIDED CORE NEEDLE BIOPSY OF THE LEFT BREAST TECHNIQUE: Multiplanar, multisequence MR imaging of the left breast was performed  both before and after administration of intravenous contrast. CONTRAST:  80m MULTIHANCE GADOBENATE DIMEGLUMINE 529 MG/ML IV SOLN COMPARISON:  Previous exams. FINDINGS: I met with the patient, and we discussed the procedure of MRI guided biopsy, including risks, benefits, and alternatives. Specifically, we discussed the risks of infection, bleeding, tissue injury, clip migration, and inadequate sampling. Informed, written consent was given. The usual time out protocol was performed immediately prior to the procedure. Using sterile technique, 1% Lidocaine, MRI guidance, and a 9 gauge vacuum assisted device, biopsy was performed of the 1.2 cm area of linear enhancement within the lateral far posterior left breast adjacent to the pectoralis muscle using a lateral approach. At the conclusion of the procedure, a HydroMARK tissue marker clip was deployed into the biopsy cavity. Follow-up 2-view mammogram was performed and dictated separately. IMPRESSION: MRI guided biopsy of abnormal linear enhancement within the lateral far posterior left breast. No apparent complications. Pathology will be followed. Electronically Signed   By: JMargarette CanadaM.D.   On: 05/25/2016 10:37   [place] [employer] Breast Loc Dev   1st Lesion  Inc [place]  Result Date: 05/25/2016 CLINICAL DATA:  Placement of biopsy clip directly into the biopsy-proven left breast cancer prior to  neoadjuvant therapy. Clip placed at time of biopsy exhibited lateral migration. EXAM: NEEDLE LOCALIZATION OF THE LEFT BREAST WITH ULTRASOUND GUIDANCE COMPARISON:  Previous exams. FINDINGS: I met with the patient and we discussed the procedure of clip placement including benefits and alternatives. We discussed the high likelihood of a successful procedure. We discussed the risks of the procedure, including infection, bleeding, tissue injury, and further surgery. Informed, written consent was given. The usual time-out protocol was performed immediately prior to the procedure. Using ultrasound guidance, sterile technique and 1% lidocaine, a coil shaped clip was a directly placed into the biopsy-proven cancer at the 2 o'clock position of the left breast 9 cm from the nipple. Follow-up mammograms performed and dictated in a separate report, but the coil shaped clip is in satisfactory position. IMPRESSION: Satisfactory coil shaped clip placement within the left breast cancer as discussed above. Electronically Signed   By: JMargarette CanadaM.D.   On: 05/25/2016 12:10    ELIGIBLE FOR AVAILABLE RESEARCH PROTOCOL: None to candidate for Prevent  ASSESSMENT: 61y.o. [place] woman status post left breast upper inner quadrant and left axillary lymph node biopsy every 16 2018, both showing invasive ductal carcinoma, grade 3, clinically T1c N1a, or stage IIB, estrogen receptor positive, progesterone receptor and HER-2 negative, with an MIB-1 of 40%  (a) MRI biopsy 05/25/2016 of a second area of concern, results pending  (1) neoadjuvant anastrozole started 05/11/2016, held at the start of chemotherapy  (2) Mammaprint results shows the tumor to be luminal B, high risk, indicating a need for chemotherapy.  (3) chemotherapy will consist of doxorubicin and cyclophosphamide in dose dense fashion 4 starting 06/09/2016, followed by weekly paclitaxel 12  (4) breast conserving surgery with targeted axillary dissection to  follow  (5) adjuvant radiation as appropriate  (6) adjuvant anti-estrogens to be continued a minimum of 5 years    PLAN: I spent approximately 30 minutes with CChrys Racerwith most of that time spent discussing her new situation. As far as surgery is concerned she understands that if the second lesion biopsied today proves malignant it will also have to be removed. Otherwise it will simply be left in place.  We discussed the Mammaprint results in detail. She understands with no treatment her risk of  recurrence would be as high as 30%. With chemotherapy as well as anti-estrogens as part of her systemic treatment we should be able to have a disease-free survival at 5 years in excess of 90%. I emphasized that she still has a good prognosis she does has to work a little harder to get it.  We then turned to standard chemotherapy which will consist of cyclophosphamide and doxorubicin in dose dense fashion 4. Generous and she will need a port as well as an echocardiogram and I am also scheduling her with our chemotherapy nurse to get more details regarding this treatment but today we went into great detail regarding the possible toxicities, side effects and complications of these agents.  Once she completes the first portion of her treatment and she will start Taxol weekly [place] hopes to continue to work full time. This means she will be treated on Thursdays, take all Friday, and be back to work on Monday the following week. She would prefer to be seen on Tuesdays or Thursdays since those are her off days. Once we get to the weekly paclitaxel she may not need to take off the day after treatment.  I encouraged her to continue her walks. They have any questions regarding diet and vitamins and I addressed those. They have an appointment with me a few days before treatment to check any final questions and given the "roadmap" on how to take her adjuvant medications  She knows to call for any other  issues that may develop before the next visit here.Marland Kitchen    Chauncey Cruel, MD   05/25/2016 5:49 PM Medical Oncology and Hematology [facility] [address] [place], [place] [address] Tel. [phone]    Fax. [phone]

## 2016-05-30 ENCOUNTER — Encounter (HOSPITAL_BASED_OUTPATIENT_CLINIC_OR_DEPARTMENT_OTHER): Payer: Self-pay | Admitting: *Deleted

## 2016-05-30 ENCOUNTER — Other Ambulatory Visit: Payer: BLUE CROSS/BLUE SHIELD

## 2016-05-31 ENCOUNTER — Encounter (HOSPITAL_BASED_OUTPATIENT_CLINIC_OR_DEPARTMENT_OTHER)
Admission: RE | Admit: 2016-05-31 | Discharge: 2016-05-31 | Disposition: A | Discharge status: Home / Self Care | Payer: BLUE CROSS/BLUE SHIELD | Source: Ambulatory Visit | Attending: General Surgery | Admitting: General Surgery

## 2016-05-31 ENCOUNTER — Other Ambulatory Visit: Payer: Self-pay

## 2016-05-31 DIAGNOSIS — D0512 Intraductal carcinoma in situ of left breast: Secondary | ICD-10-CM | POA: Diagnosis not present

## 2016-05-31 DIAGNOSIS — Z8261 Family history of arthritis: Secondary | ICD-10-CM | POA: Diagnosis not present

## 2016-05-31 DIAGNOSIS — M1990 Unspecified osteoarthritis, unspecified site: Secondary | ICD-10-CM | POA: Diagnosis not present

## 2016-05-31 DIAGNOSIS — I10 Essential (primary) hypertension: Secondary | ICD-10-CM | POA: Diagnosis not present

## 2016-05-31 DIAGNOSIS — Z170 Estrogen receptor positive status [ER+]: Secondary | ICD-10-CM | POA: Diagnosis not present

## 2016-05-31 DIAGNOSIS — E7800 Pure hypercholesterolemia, unspecified: Secondary | ICD-10-CM | POA: Diagnosis not present

## 2016-05-31 DIAGNOSIS — Z803 Family history of malignant neoplasm of breast: Secondary | ICD-10-CM | POA: Diagnosis not present

## 2016-05-31 DIAGNOSIS — Z8249 Family history of ischemic heart disease and other diseases of the circulatory system: Secondary | ICD-10-CM | POA: Diagnosis not present

## 2016-05-31 DIAGNOSIS — C50412 Malignant neoplasm of upper-outer quadrant of left female breast: Secondary | ICD-10-CM | POA: Diagnosis not present

## 2016-05-31 DIAGNOSIS — Z6834 Body mass index (BMI) 34.0-34.9, adult: Secondary | ICD-10-CM | POA: Diagnosis not present

## 2016-05-31 DIAGNOSIS — C773 Secondary and unspecified malignant neoplasm of axilla and upper limb lymph nodes: Secondary | ICD-10-CM | POA: Diagnosis not present

## 2016-05-31 DIAGNOSIS — Z833 Family history of diabetes mellitus: Secondary | ICD-10-CM | POA: Diagnosis not present

## 2016-06-02 ENCOUNTER — Ambulatory Visit (HOSPITAL_BASED_OUTPATIENT_CLINIC_OR_DEPARTMENT_OTHER)
Admission: RE | Admit: 2016-06-02 | Discharge: 2016-06-02 | Disposition: A | Discharge status: Home / Self Care | Payer: BLUE CROSS/BLUE SHIELD | Source: Ambulatory Visit | Attending: General Surgery | Admitting: General Surgery

## 2016-06-02 ENCOUNTER — Ambulatory Visit (HOSPITAL_COMMUNITY): Payer: BLUE CROSS/BLUE SHIELD

## 2016-06-02 ENCOUNTER — Ambulatory Visit (HOSPITAL_BASED_OUTPATIENT_CLINIC_OR_DEPARTMENT_OTHER): Payer: BLUE CROSS/BLUE SHIELD | Admitting: Anesthesiology

## 2016-06-02 ENCOUNTER — Encounter (HOSPITAL_BASED_OUTPATIENT_CLINIC_OR_DEPARTMENT_OTHER): Payer: Self-pay | Admitting: Anesthesiology

## 2016-06-02 ENCOUNTER — Encounter (HOSPITAL_BASED_OUTPATIENT_CLINIC_OR_DEPARTMENT_OTHER)
Admission: RE | Disposition: A | Discharge status: Home / Self Care | Payer: Self-pay | Source: Ambulatory Visit | Attending: General Surgery

## 2016-06-02 DIAGNOSIS — E7800 Pure hypercholesterolemia, unspecified: Secondary | ICD-10-CM | POA: Diagnosis not present

## 2016-06-02 DIAGNOSIS — Z803 Family history of malignant neoplasm of breast: Secondary | ICD-10-CM | POA: Insufficient documentation

## 2016-06-02 DIAGNOSIS — Z6834 Body mass index (BMI) 34.0-34.9, adult: Secondary | ICD-10-CM | POA: Insufficient documentation

## 2016-06-02 DIAGNOSIS — D0512 Intraductal carcinoma in situ of left breast: Secondary | ICD-10-CM | POA: Insufficient documentation

## 2016-06-02 DIAGNOSIS — I10 Essential (primary) hypertension: Secondary | ICD-10-CM | POA: Insufficient documentation

## 2016-06-02 DIAGNOSIS — Z833 Family history of diabetes mellitus: Secondary | ICD-10-CM | POA: Diagnosis not present

## 2016-06-02 DIAGNOSIS — Z95828 Presence of other vascular implants and grafts: Secondary | ICD-10-CM

## 2016-06-02 DIAGNOSIS — M1990 Unspecified osteoarthritis, unspecified site: Secondary | ICD-10-CM | POA: Diagnosis not present

## 2016-06-02 DIAGNOSIS — Z170 Estrogen receptor positive status [ER+]: Secondary | ICD-10-CM | POA: Insufficient documentation

## 2016-06-02 DIAGNOSIS — C50412 Malignant neoplasm of upper-outer quadrant of left female breast: Secondary | ICD-10-CM | POA: Diagnosis not present

## 2016-06-02 DIAGNOSIS — C773 Secondary and unspecified malignant neoplasm of axilla and upper limb lymph nodes: Secondary | ICD-10-CM | POA: Insufficient documentation

## 2016-06-02 DIAGNOSIS — Z8249 Family history of ischemic heart disease and other diseases of the circulatory system: Secondary | ICD-10-CM | POA: Diagnosis not present

## 2016-06-02 DIAGNOSIS — C50919 Malignant neoplasm of unspecified site of unspecified female breast: Secondary | ICD-10-CM

## 2016-06-02 DIAGNOSIS — E785 Hyperlipidemia, unspecified: Secondary | ICD-10-CM | POA: Diagnosis not present

## 2016-06-02 DIAGNOSIS — Z8261 Family history of arthritis: Secondary | ICD-10-CM | POA: Diagnosis not present

## 2016-06-02 DIAGNOSIS — Z452 Encounter for adjustment and management of vascular access device: Secondary | ICD-10-CM | POA: Diagnosis not present

## 2016-06-02 HISTORY — PX: PORTACATH PLACEMENT: SHX2246

## 2016-06-02 SURGERY — INSERTION, TUNNELED CENTRAL VENOUS DEVICE, WITH PORT
Anesthesia: General | Site: Chest | Laterality: Right

## 2016-06-02 MED ORDER — CHLORHEXIDINE GLUCONATE CLOTH 2 % EX PADS: 6.0000 | MEDICATED_PAD | Freq: Once | CUTANEOUS | Status: DC

## 2016-06-02 MED ORDER — MIDAZOLAM HCL 5 MG/5ML IJ SOLN
INTRAMUSCULAR | Status: DC | PRN
  Administered 2016-06-02: 2 mg via INTRAVENOUS

## 2016-06-02 MED ORDER — METOPROLOL TARTRATE 5 MG/5ML IV SOLN
INTRAVENOUS | Status: AC
  Filled 2016-06-02: qty 5

## 2016-06-02 MED ORDER — MIDAZOLAM HCL 2 MG/2ML IJ SOLN
INTRAMUSCULAR | Status: AC
  Filled 2016-06-02: qty 2

## 2016-06-02 MED ORDER — ONDANSETRON HCL 4 MG/2ML IJ SOLN
INTRAMUSCULAR | Status: AC
  Filled 2016-06-02: qty 2

## 2016-06-02 MED ORDER — ACETAMINOPHEN 500 MG PO TABS
1000.0000 mg | ORAL_TABLET | ORAL | Status: AC
  Administered 2016-06-02: 1000 mg via ORAL

## 2016-06-02 MED ORDER — PHENYLEPHRINE HCL 10 MG/ML IJ SOLN
INTRAMUSCULAR | Status: DC | PRN
  Administered 2016-06-02 (×4): 80 ug via INTRAVENOUS

## 2016-06-02 MED ORDER — LIDOCAINE 2% (20 MG/ML) 5 ML SYRINGE
INTRAMUSCULAR | Status: AC
  Filled 2016-06-02: qty 5

## 2016-06-02 MED ORDER — DEXAMETHASONE SODIUM PHOSPHATE 4 MG/ML IJ SOLN
INTRAMUSCULAR | Status: DC | PRN
  Administered 2016-06-02: 10 mg via INTRAVENOUS

## 2016-06-02 MED ORDER — LIDOCAINE HCL (CARDIAC) 20 MG/ML IV SOLN
INTRAVENOUS | Status: DC | PRN
  Administered 2016-06-02: 30 mg via INTRAVENOUS

## 2016-06-02 MED ORDER — BUPIVACAINE HCL (PF) 0.25 % IJ SOLN
INTRAMUSCULAR | Status: DC | PRN
Start: 2016-06-02 — End: 2016-06-02
  Administered 2016-06-02: 8 mL

## 2016-06-02 MED ORDER — ONDANSETRON HCL 4 MG/2ML IJ SOLN
INTRAMUSCULAR | Status: DC | PRN
  Administered 2016-06-02: 4 mg via INTRAVENOUS

## 2016-06-02 MED ORDER — LACTATED RINGERS IV SOLN
INTRAVENOUS | Status: DC
  Administered 2016-06-02: 09:00:00 via INTRAVENOUS

## 2016-06-02 MED ORDER — ESMOLOL HCL 100 MG/10ML IV SOLN
INTRAVENOUS | Status: AC
  Filled 2016-06-02: qty 10

## 2016-06-02 MED ORDER — HYDROMORPHONE HCL 1 MG/ML IJ SOLN: 0.2500 mg | INTRAMUSCULAR | Status: DC | PRN

## 2016-06-02 MED ORDER — HEPARIN SOD (PORK) LOCK FLUSH 100 UNIT/ML IV SOLN
INTRAVENOUS | Status: AC
  Filled 2016-06-02: qty 5

## 2016-06-02 MED ORDER — GABAPENTIN 300 MG PO CAPS
ORAL_CAPSULE | ORAL | Status: AC
  Filled 2016-06-02: qty 1

## 2016-06-02 MED ORDER — GABAPENTIN 300 MG PO CAPS
300.0000 mg | ORAL_CAPSULE | ORAL | Status: AC
  Administered 2016-06-02: 300 mg via ORAL

## 2016-06-02 MED ORDER — FENTANYL CITRATE (PF) 100 MCG/2ML IJ SOLN
INTRAMUSCULAR | Status: DC | PRN
  Administered 2016-06-02 (×2): 100 ug via INTRAVENOUS

## 2016-06-02 MED ORDER — PROMETHAZINE HCL 25 MG/ML IJ SOLN: 6.2500 mg | INTRAMUSCULAR | Status: DC | PRN

## 2016-06-02 MED ORDER — PHENYLEPHRINE 40 MCG/ML (10ML) SYRINGE FOR IV PUSH (FOR BLOOD PRESSURE SUPPORT)
PREFILLED_SYRINGE | INTRAVENOUS | Status: AC
  Filled 2016-06-02: qty 10

## 2016-06-02 MED ORDER — FENTANYL CITRATE (PF) 100 MCG/2ML IJ SOLN
INTRAMUSCULAR | Status: AC
  Filled 2016-06-02: qty 2

## 2016-06-02 MED ORDER — PROPOFOL 10 MG/ML IV BOLUS
INTRAVENOUS | Status: AC
  Filled 2016-06-02: qty 20

## 2016-06-02 MED ORDER — DEXAMETHASONE SODIUM PHOSPHATE 10 MG/ML IJ SOLN
INTRAMUSCULAR | Status: AC
  Filled 2016-06-02: qty 1

## 2016-06-02 MED ORDER — SCOPOLAMINE 1 MG/3DAYS TD PT72: 1.0000 | MEDICATED_PATCH | Freq: Once | TRANSDERMAL | Status: DC | PRN

## 2016-06-02 MED ORDER — PROPOFOL 10 MG/ML IV BOLUS
INTRAVENOUS | Status: DC | PRN
  Administered 2016-06-02: 250 mg via INTRAVENOUS

## 2016-06-02 MED ORDER — CEFAZOLIN SODIUM-DEXTROSE 2-4 GM/100ML-% IV SOLN
INTRAVENOUS | Status: AC
  Filled 2016-06-02: qty 100

## 2016-06-02 MED ORDER — HEPARIN (PORCINE) IN NACL 2-0.9 UNIT/ML-% IJ SOLN
INTRAMUSCULAR | Status: DC | PRN
  Administered 2016-06-02: 500 mL

## 2016-06-02 MED ORDER — CEFAZOLIN SODIUM-DEXTROSE 2-4 GM/100ML-% IV SOLN
2.0000 g | INTRAVENOUS | Status: AC
  Administered 2016-06-02: 2 g via INTRAVENOUS

## 2016-06-02 MED ORDER — ACETAMINOPHEN 500 MG PO TABS
ORAL_TABLET | ORAL | Status: AC
  Filled 2016-06-02: qty 2

## 2016-06-02 MED ORDER — HEPARIN SOD (PORK) LOCK FLUSH 100 UNIT/ML IV SOLN
INTRAVENOUS | Status: DC | PRN
  Administered 2016-06-02: 400 [IU] via INTRAVENOUS

## 2016-06-02 MED ORDER — BUPIVACAINE HCL (PF) 0.25 % IJ SOLN
INTRAMUSCULAR | Status: AC
  Filled 2016-06-02: qty 30

## 2016-06-02 SURGICAL SUPPLY — 54 items
ADH SKN CLS APL DERMABOND .7 (GAUZE/BANDAGES/DRESSINGS) ×1
APL SKNCLS STERI-STRIP NONHPOA (GAUZE/BANDAGES/DRESSINGS)
BAG DECANTER FOR FLEXI CONT (MISCELLANEOUS) ×2 IMPLANT
BENZOIN TINCTURE PRP APPL 2/3 (GAUZE/BANDAGES/DRESSINGS) IMPLANT
BLADE SURG 11 STRL SS (BLADE) ×2 IMPLANT
BLADE SURG 15 STRL LF DISP TIS (BLADE) ×1 IMPLANT
BLADE SURG 15 STRL SS (BLADE) ×2
CANISTER SUCT 1200ML W/VALVE (MISCELLANEOUS) IMPLANT
CHLORAPREP W/TINT 26ML (MISCELLANEOUS) ×2 IMPLANT
COVER BACK TABLE 60X90IN (DRAPES) ×2 IMPLANT
COVER MAYO STAND STRL (DRAPES) ×2 IMPLANT
COVER PROBE 5X48 (MISCELLANEOUS) ×2
DECANTER SPIKE VIAL GLASS SM (MISCELLANEOUS) IMPLANT
DERMABOND ADVANCED (GAUZE/BANDAGES/DRESSINGS) ×1
DERMABOND ADVANCED .7 DNX12 (GAUZE/BANDAGES/DRESSINGS) ×1 IMPLANT
DRAPE C-ARM 42X72 X-RAY (DRAPES) ×2 IMPLANT
DRAPE LAPAROSCOPIC ABDOMINAL (DRAPES) ×2 IMPLANT
DRAPE UTILITY XL STRL (DRAPES) ×2 IMPLANT
DRSG TEGADERM 4X4.75 (GAUZE/BANDAGES/DRESSINGS) IMPLANT
ELECT COATED BLADE 2.86 ST (ELECTRODE) ×2 IMPLANT
ELECT REM PT RETURN 9FT ADLT (ELECTROSURGICAL) ×2
ELECTRODE REM PT RTRN 9FT ADLT (ELECTROSURGICAL) ×1 IMPLANT
GAUZE SPONGE 4X4 12PLY STRL LF (GAUZE/BANDAGES/DRESSINGS) ×2 IMPLANT
GLOVE BIO SURGEON STRL SZ7 (GLOVE) ×2 IMPLANT
GLOVE BIO SURGEON STRL SZ8 (GLOVE) ×2 IMPLANT
GLOVE BIOGEL PI IND STRL 6.5 (GLOVE) ×1 IMPLANT
GLOVE BIOGEL PI IND STRL 7.5 (GLOVE) ×2 IMPLANT
GLOVE BIOGEL PI INDICATOR 6.5 (GLOVE) ×1
GLOVE BIOGEL PI INDICATOR 7.5 (GLOVE) ×2
GLOVE SURG SS PI 7.5 STRL IVOR (GLOVE) ×2 IMPLANT
GOWN STRL REUS W/ TWL LRG LVL3 (GOWN DISPOSABLE) ×2 IMPLANT
GOWN STRL REUS W/TWL LRG LVL3 (GOWN DISPOSABLE) ×4
IV KIT MINILOC 20X1 SAFETY (NEEDLE) IMPLANT
KIT CVR 48X5XPRB PLUP LF (MISCELLANEOUS) ×1 IMPLANT
KIT PORT POWER 8FR ISP CVUE (Catheter) ×2 IMPLANT
NDL SAFETY ECLIPSE 18X1.5 (NEEDLE) IMPLANT
NEEDLE HYPO 18GX1.5 SHARP (NEEDLE)
NEEDLE HYPO 25X1 1.5 SAFETY (NEEDLE) ×2 IMPLANT
PACK BASIN DAY SURGERY FS (CUSTOM PROCEDURE TRAY) ×2 IMPLANT
PENCIL BUTTON HOLSTER BLD 10FT (ELECTRODE) ×2 IMPLANT
SLEEVE SCD COMPRESS KNEE MED (MISCELLANEOUS) ×2 IMPLANT
STRIP CLOSURE SKIN 1/2X4 (GAUZE/BANDAGES/DRESSINGS) ×2 IMPLANT
SUT MON AB 4-0 PC3 18 (SUTURE) ×2 IMPLANT
SUT PROLENE 2 0 SH DA (SUTURE) ×2 IMPLANT
SUT SILK 2 0 TIES 17X18 (SUTURE)
SUT SILK 2-0 18XBRD TIE BLK (SUTURE) IMPLANT
SUT VIC AB 3-0 SH 27 (SUTURE) ×2
SUT VIC AB 3-0 SH 27X BRD (SUTURE) ×1 IMPLANT
SYR 5ML LUER SLIP (SYRINGE) ×2 IMPLANT
SYR CONTROL 10ML LL (SYRINGE) ×2 IMPLANT
TOWEL OR 17X24 6PK STRL BLUE (TOWEL DISPOSABLE) ×2 IMPLANT
TOWEL OR NON WOVEN STRL DISP B (DISPOSABLE) ×2 IMPLANT
TUBE CONNECTING 20X1/4 (TUBING) IMPLANT
YANKAUER SUCT BULB TIP NO VENT (SUCTIONS) IMPLANT

## 2016-06-02 NOTE — Op Note (Signed)
Preoperative diagnosis: breast cancer need for venous access Postoperative diagnosis: same as above Procedure: right ij US guided powerport insertion Surgeon: Dr Serita Grammes EBL: minimal Anes: general  Specimens none Complications none Drains none Sponge count correct Dispo to pacu stable  Indications: This is a 26 yof with newly diagnosed breast cancer. She is due to begin primary systemic chemotherapy. We discussed port placement.   Procedure: After informed consent was obtained the patient was taken to the operating room. She was given antibiotics. Sequential compression devices were on her legs. She was then placed under general anesthesia with an LMA. Then she was prepped and draped in the standard sterile surgical fashion. Surgical timeout was then performed.  I used the ultrasound to identify the right internal jugular vein. I then accessed the vein using the ultrasound.This aspirated blood. I then placed the wire.  This was confirmed by fluoroscopy and ultrasound to be in the correct position. I created a pocket on the right chest.. I tunneled the line between the 2 sites. I then dilated the tract and placed the dilator assembly with the sheath. This was done under fluoroscopy. I then removed the sheath and dilator. The wire was also removed. The line was then pulled back to be in the venacava. I hooked this up to the port. I sutured this into place with 2-0 Prolene in 2 places. This aspirated blood and flushed easily.This was confirmed with a final fluoroscopy. I then closed this with 2-0 Vicryl and 4-0 Monocryl. Dermabond was placed on both the incisions.A dressing was placed. She tolerated this well and was transferred to the recovery room in stable condition

## 2016-06-02 NOTE — Interval H&P Note (Signed)
History and Physical Interval Note:  06/02/2016 9:26 AM  Tanya [name]  has presented today for surgery, with the diagnosis of BREAST CANCER  The various methods of treatment have been discussed with the patient and family. After consideration of risks, benefits and other options for treatment, the patient has consented to  Procedure(s): INSERTION PORT-A-CATH WITH [place] (N/A) as a surgical intervention .  The patient's history has been reviewed, patient examined, no change in status, stable for surgery.  I have reviewed the patient's chart and labs.  Questions were answered to the patient's satisfaction.     [name]

## 2016-06-02 NOTE — Anesthesia Preprocedure Evaluation (Signed)
Anesthesia Evaluation  Patient identified by MRN, date of birth, ID band Patient awake    Reviewed: Allergy & Precautions, NPO status , Patient's Chart, lab work & pertinent test results  History of Anesthesia Complications Negative for: history of anesthetic complications  Airway Mallampati: II  TM Distance: >3 FB Neck ROM: Full    Dental no notable dental hx. (+) Dental Advisory Given   Pulmonary neg pulmonary ROS,    Pulmonary exam normal        Cardiovascular hypertension, Pt. on medications Normal cardiovascular exam     Neuro/Psych PSYCHIATRIC DISORDERS Anxiety negative neurological ROS     GI/Hepatic negative GI ROS, Neg liver ROS,   Endo/Other  Morbid obesity  Renal/GU negative Renal ROS     Musculoskeletal   Abdominal   Peds  Hematology   Anesthesia Other Findings   Reproductive/Obstetrics                             Anesthesia Physical Anesthesia Plan  ASA: II  Anesthesia Plan: General   Post-op Pain Management:    Induction: Intravenous  Airway Management Planned: LMA  Additional Equipment:   Intra-op Plan:   Post-operative Plan: Extubation in OR  Informed Consent: I have reviewed the patients History and Physical, chart, labs and discussed the procedure including the risks, benefits and alternatives for the proposed anesthesia with the patient or authorized representative who has indicated his/her understanding and acceptance.   Dental advisory given  Plan Discussed with: CRNA, Anesthesiologist and Surgeon  Anesthesia Plan Comments:         Anesthesia Quick Evaluation

## 2016-06-02 NOTE — Anesthesia Procedure Notes (Signed)
Procedure Name: LMA Insertion Date/Time: 06/02/2016 9:54 AM Performed by: Toula Moos L Pre-anesthesia Checklist: Patient identified, Emergency Drugs available, Suction available, Patient being monitored and Timeout performed Patient Re-evaluated:Patient Re-evaluated prior to inductionOxygen Delivery Method: Circle system utilized Preoxygenation: Pre-oxygenation with 100% oxygen Intubation Type: IV induction Ventilation: Mask ventilation without difficulty LMA: LMA inserted LMA Size: 4.0 Number of attempts: 1 Airway Equipment and Method: Bite block Placement Confirmation: positive ETCO2 Tube secured with: Tape Dental Injury: Teeth and Oropharynx as per pre-operative assessment

## 2016-06-02 NOTE — H&P (Signed)
61 yof referred by Dr Kyung Rudd for evaluation of new left breast cancer. she works as a Multimedia programmer at [employer] and she is originally from [name]. she is here with her husband, daughter and a son today. she has fh in mom over age 61. she has no real issues previously with either breast. she underwent screening mm that shows c density breasts. there is in left breast an asymmetry with prominent node. US of the breast mass shows a 1.3x0.9x1.8 cm breast mass with an adjacent 1.1 cm mass (or enlarged im node) at 2 oclock in left breast. the entire area measures 3.5 cm. there is an enlarged axillary node measuring 1.8 cm. the clip in the breast appears to be 2.5 cm lateral and superior to the mass likely migration. biopsy of the axillary node is positive. the breast mass is IDC, high grade with dcis that is er pos at 95%, pr negative, her 2 negative and Ki is 40%    Past Surgical History Tawni Pummel, RN; 05/11/2016 7:46 AM) Breast Mass; Local Excision  Left. Cesarean Section - Multiple  Oral Surgery   Diagnostic Studies History Tawni Pummel, RN; 05/11/2016 7:46 AM) Colonoscopy  never Mammogram  within last year Pap Smear  1-5 years ago  Medication History Tawni Pummel, RN; 05/11/2016 7:46 AM) Medications Reconciled  Social History Tawni Pummel, RN; 05/11/2016 7:46 AM) Alcohol use  Occasional alcohol use. Caffeine use  Carbonated beverages, Coffee, Tea. No drug use  Tobacco use  Never smoker.  Family History Tawni Pummel, RN; 05/11/2016 7:46 AM) Arthritis  Mother. Breast Cancer  Mother. Diabetes Mellitus  Father, Mother. Hypertension  Mother.  Pregnancy / Birth History Tawni Pummel, RN; 05/11/2016 7:46 AM) Age at menarche  61 years. Age of menopause  25-50 Contraceptive History  Oral contraceptives. Gravida  5 Irregular periods  Length (months) of breastfeeding  7-12 Maternal age  67-25 Para  42  Other Problems Tawni Pummel,  RN; 05/11/2016 7:46 AM) Arthritis  Back Pain  Breast Cancer  Chest pain  Heart murmur  High blood pressure  Hypercholesterolemia  Lump In Breast     Review of Systems Sunday Spillers Ledford RN; 05/11/2016 7:46 AM) General Present- Night Sweats. Not Present- Appetite Loss, Chills, Fatigue, Fever, Weight Gain and Weight Loss. Skin Present- Dryness. Not Present- Change in Wart/Mole, Hives, Jaundice, New Lesions, Non-Healing Wounds, Rash and Ulcer. HEENT Not Present- Earache, Hearing Loss, Hoarseness, Nose Bleed, Oral Ulcers, Ringing in the Ears, Seasonal Allergies, Sinus Pain, Sore Throat, Visual Disturbances, Wears glasses/contact lenses and Yellow Eyes. Respiratory Present- Snoring. Not Present- Bloody sputum, Chronic Cough, Difficulty Breathing and Wheezing. Cardiovascular Present- Chest Pain. Not Present- Difficulty Breathing Lying Down, Leg Cramps, Palpitations, Rapid Heart Rate, Shortness of Breath and Swelling of Extremities. Gastrointestinal Present- Difficulty Swallowing. Not Present- Abdominal Pain, Bloating, Bloody Stool, Change in Bowel Habits, Chronic diarrhea, Constipation, Excessive gas, Gets full quickly at meals, Hemorrhoids, Indigestion, Nausea, Rectal Pain and Vomiting. Female Genitourinary Not Present- Frequency, Nocturia, Painful Urination, Pelvic Pain and Urgency. Musculoskeletal Present- Back Pain. Not Present- Joint Pain, Joint Stiffness, Muscle Pain, Muscle Weakness and Swelling of Extremities. Psychiatric Present- Change in Sleep Pattern. Not Present- Anxiety, Bipolar, Depression, Fearful and Frequent crying. Endocrine Not Present- Cold Intolerance, Excessive Hunger, Hair Changes, Heat Intolerance, Hot flashes and New Diabetes. Hematology Not Present- Blood Thinners, Easy Bruising, Excessive bleeding, Gland problems, HIV and Persistent Infections.   Physical Exam Rolm Bookbinder MD; 05/11/2016 8:32 PM) General Mental Status-Alert. Orientation-Oriented  X3.  Chest and Lung Exam Chest and lung exam reveals -on auscultation, normal breath sounds, no adventitious sounds and normal vocal resonance.  Breast Nipples-No Discharge. Breast Lump-No Palpable Breast Mass.  Cardiovascular Cardiovascular examination reveals -normal heart sounds, regular rate and rhythm with no murmurs.  Lymphatic Head & Neck  General Head & Neck Lymphatics: Bilateral - Description - Normal. Axillary  General Axillary Region: Bilateral - Description - Normal. Note: no Mustang adenopathy     Assessment & Plan Rolm Bookbinder MD; 05/11/2016 8:40 PM) BREAST CANCER OF UPPER-OUTER QUADRANT OF LEFT FEMALE BREAST (C50.412) Port placement mammaprint high risk and additional biopsy of lesion 2 cm away is cancer also. I think primary systemic therapy to shrink this to make more amenable to bct is best option.

## 2016-06-02 NOTE — Anesthesia Postprocedure Evaluation (Addendum)
Anesthesia Post Note  Patient: Tanya [name]  Procedure(s) Performed: Procedure(s) (LRB): INSERTION PORT-A-CATH WITH ULTRASOUND GUIDANCE (Right)  Patient location during evaluation: PACU Anesthesia Type: General Level of consciousness: sedated Pain management: pain level controlled Vital Signs Assessment: post-procedure vital signs reviewed and stable Respiratory status: spontaneous breathing and respiratory function stable Cardiovascular status: stable Anesthetic complications: no       Last Vitals:  Vitals:   06/02/16 1130 06/02/16 1149  BP: (!) 149/96 (!) 145/81  Pulse: 69 74  Resp: 19 16  Temp:  36.7 C    Last Pain:  Vitals:   06/02/16 1130  PainSc: 4                  Elisama Thissen DANIEL

## 2016-06-02 NOTE — Transfer of Care (Signed)
Immediate Anesthesia Transfer of Care Note  Patient: Tanya [name]  Procedure(s) Performed: Procedure(s): INSERTION PORT-A-CATH WITH ULTRASOUND GUIDANCE (Right)  Patient Location: PACU  Anesthesia Type:General  Level of Consciousness: sedated  Airway & Oxygen Therapy: Patient Spontanous Breathing and Patient connected to face mask oxygen  Post-op Assessment: Report given to RN and Post -op Vital signs reviewed and stable  Post vital signs: Reviewed and stable  Last Vitals:  Vitals:   06/02/16 0834  BP: (!) 157/91  Pulse: 90  Resp: 18  Temp: 37.1 C    Last Pain: There were no vitals filed for this visit.    Patients Stated Pain Goal: 0 (22/57/50 [id number])  Complications: No apparent anesthesia complications

## 2016-06-02 NOTE — Discharge Instructions (Signed)

## 2016-06-03 ENCOUNTER — Encounter (HOSPITAL_BASED_OUTPATIENT_CLINIC_OR_DEPARTMENT_OTHER): Payer: Self-pay | Admitting: General Surgery

## 2016-06-03 ENCOUNTER — Telehealth: Payer: Self-pay | Admitting: Oncology

## 2016-06-03 NOTE — Telephone Encounter (Signed)
lvm to inform pt of next weeks appts date/times per LOS

## 2016-06-06 ENCOUNTER — Ambulatory Visit (HOSPITAL_BASED_OUTPATIENT_CLINIC_OR_DEPARTMENT_OTHER): Payer: BLUE CROSS/BLUE SHIELD | Admitting: Oncology

## 2016-06-06 VITALS — BP 152/89 | HR 95 | Temp 98.0°F | Resp 18 | Ht 63.0 in | Wt 197.1 lb

## 2016-06-06 DIAGNOSIS — Z170 Estrogen receptor positive status [ER+]: Secondary | ICD-10-CM | POA: Diagnosis not present

## 2016-06-06 DIAGNOSIS — C773 Secondary and unspecified malignant neoplasm of axilla and upper limb lymph nodes: Secondary | ICD-10-CM | POA: Diagnosis not present

## 2016-06-06 DIAGNOSIS — C50212 Malignant neoplasm of upper-inner quadrant of left female breast: Secondary | ICD-10-CM | POA: Diagnosis not present

## 2016-06-06 DIAGNOSIS — C50412 Malignant neoplasm of upper-outer quadrant of left female breast: Secondary | ICD-10-CM

## 2016-06-06 MED ORDER — LORAZEPAM 0.5 MG PO TABS: 0.5000 mg | ORAL_TABLET | Freq: Every evening | ORAL | 0 refills | Status: DC | PRN

## 2016-06-06 MED ORDER — LORATADINE 10 MG PO TABS: 10.0000 mg | ORAL_TABLET | Freq: Every day | ORAL | 3 refills | Status: DC

## 2016-06-06 MED ORDER — DEXAMETHASONE 4 MG PO TABS: ORAL_TABLET | ORAL | 1 refills | Status: DC

## 2016-06-06 MED ORDER — LIDOCAINE-PRILOCAINE 2.5-2.5 % EX CREA: TOPICAL_CREAM | CUTANEOUS | 3 refills | Status: DC

## 2016-06-06 MED ORDER — PROCHLORPERAZINE MALEATE 10 MG PO TABS: 10.0000 mg | ORAL_TABLET | Freq: Four times a day (QID) | ORAL | 1 refills | Status: DC | PRN

## 2016-06-06 NOTE — Progress Notes (Signed)
[place]  [phone] Fax:([phone]) [phone]     ID: Tanya [name] DOB: 1956/02/03  MR#: [id number]  JAS#:[id number]  Patient Care Team: Everrett Coombe, MD as PCP - General Rolm Bookbinder, MD as Consulting Physician (General Surgery) Chauncey Cruel, MD as Consulting Physician (Oncology) Kyung Rudd, MD as Consulting Physician (Radiation Oncology) Chauncey Cruel, MD OTHER MD:  CHIEF COMPLAINT: Estrogen receptor positive breast cancer  CURRENT TREATMENT: Neoadjuvant chemotherapy   BREAST CANCER HISTORY: From the original intake note:  Tanya [name] had routine bilateral screening mammography at the [facility] 04/19/2016. This showed a possible area of the symmetry in the left breast and a prominent left axillary lymph nodes. On 04/28/2016 she underwent diagnostic left mammography with tomography and left breast ultrasonography. This showed the breast density to be category B. There was an obscured mass in the upper outer left breast and an enlarged left axillary lymph node. On exam there was focal thickening at the 2:00 position of the left breast 9 cm from the nipple. Targeted ultrasonography confirmed a 1.8 cm hypoechoic mass with a second adjacent 1.1 cm mass both at the 2:00 position of the left breast 9 cm from the nipple. The conglomerate area measured 3.5 cm. There was also an enlarged left axillary lymph node with loss of fatty hilum measuring 1.8 cm.  On family 16 2018 the patient underwent biopsy of the larger left breast mass in question and the suspicious left axillary lymph node. 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  The patient's subsequent history is as detailed below  INTERVAL HISTORY: Tanya [name] returns today for  follow-up of her estrogen receptor positive breast cancer, which is to be treated neoadjuvantly,  ccopned by 2 o her daughtrs. Since her last visit here she had a biopsy of an additional area of concern in the left breast laterally and posteriorly. This also showed invasive mammary carcinoma, high-grade, associated with a dense lymphocytic infiltrate. E-cadherin showed diffuse strong positivity. This was for an invasive ductal carcinoma ([id number])  She also had her port placed, on 06/02/2016. She is scheduled for chemotherapy school and echocardiography later this week. The planned start date for chemotherapy is 06/09/2016. She will receive doxorubicin and cyclophosphamide in dose dense fashion 4, with OnPro support, followed by taxanes weekly 12.  REVIEW OF SYSTEMS: Envi isa bit anxious of course but still working and not yet excercising. She says she needs someone to walk with her if she's going to do that. She tolerated the port placement w/o event. A detailed ROS today was otherwise noncontributorry   PAST MEDICAL HISTORY: Past Medical History:  Diagnosis Date  . ANXIETY   . Dyspnea on exertion   . HYPERLIPIDEMIA   . HYPERTENSION, BENIGN ESSENTIAL   . Malignant neoplasm of upper-outer quadrant of left breast in female, estrogen receptor positive ([place]) 05/04/2016  . OBESITY     PAST SURGICAL HISTORY: Past Surgical History:  Procedure Laterality Date  . CESAREAN SECTION    . PORTACATH PLACEMENT Right 06/02/2016   Procedure: INSERTION PORT-A-CATH WITH ULTRASOUND GUIDANCE;  Surgeon: Rolm Bookbinder, MD;  Location: [place];  Service: General;  Laterality: Right;  . TUBAL LIGATION      FAMILY HISTORY Family History  Problem Relation Age of Onset  . Breast cancer Mother 55  The  patient's father had a history of prostate cancer. He died at age 45. The patient's mother died at age 58 from unclear causes. She had a history of breast cancer diagnosed in her 25s.  The patient had 4 brothers, 2 sisters. There is no other history of breast or ovarian cancer in the family to the patient's knowledge  GYNECOLOGIC HISTORY:  No LMP recorded. Patient is postmenopausal. Menarche age 9, first live birth age 62, the patient is [place] P4. She stopped having periods approximately 2006. She did not use hormone replacement. She used oral contraceptives briefly and remotely, with no complications.  SOCIAL HISTORY:  The patient is originally from [name]. 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 The patient's husband [name] was also a professor at [employer], where he taught agriculture oh research. Son [name] works in Land, son [name] works for [employer], daughter [name] is a Education officer, museum, and daughter [name] is also a Education officer, museum, all living in [place]. The patient has 3 grandchildren. She attends [employer]    ADVANCED DIRECTIVES: Not in place   HEALTH MAINTENANCE: Social History  Substance Use Topics  . Smoking status: Never Smoker  . Smokeless tobacco: Never Used  . Alcohol use Yes     Comment: rarely     Colonoscopy:Never  PAP: November 2017  Bone density: Never   Allergies  Allergen Reactions  . Camoquin [Amodiaquine] Itching    Current Outpatient Prescriptions  Medication Sig Dispense Refill  . anastrozole (ARIMIDEX) 1 MG tablet Take 1 tablet (1 mg total) by mouth daily. 90 tablet 4  . dexamethasone (DECADRON) 4 MG tablet Take 2 tablets by mouth once a day on the day after chemotherapy and then take 2 tablets two times a day for 2 days. Take with food. 30 tablet 1  . lidocaine-prilocaine (EMLA) cream Apply to affected area once 30 g 3  . loratadine (CLARITIN) 10 MG tablet Take 1 tablet (10 mg total) by mouth daily. 30 tablet 3  . LORazepam (ATIVAN) 0.5 MG tablet Take 1 tablet (0.5 mg total) by mouth at bedtime as needed for anxiety. 30 tablet 0  . LORazepam (ATIVAN) 0.5 MG tablet Take 1 tablet (0.5 mg total) by  mouth at bedtime as needed (Nausea or vomiting). 30 tablet 0  . losartan (COZAAR) 50 MG tablet Take 1 tablet (50 mg total) by mouth daily. 90 tablet 2  . pravastatin (PRAVACHOL) 40 MG tablet Take 1 tablet (40 mg total) by mouth at bedtime. 90 tablet 2  . prochlorperazine (COMPAZINE) 10 MG tablet Take 1 tablet (10 mg total) by mouth every 6 (six) hours as needed (Nausea or vomiting). 30 tablet 1   No current facility-administered medications for this visit.     OBJECTIVE: Middle-aged African-American woman in no acute distress  Vitals:   06/06/16 1633  BP: (!) 152/89  Pulse: 95  Resp: 18  Temp: 98 F (36.7 C)     Body mass index is 34.91 kg/m.    ECOG FS:0 - Asymptomatic  Sclerae unicteric, pupils round and equal Oropharynx clear and moist-- no thrush or other lesions No cervical or supraclavicular adenopathy Lungs no rales or rhonchi Heart regular rate and rhythm Abd soft, nontender, positive bowel sounds MSK no focal spinal tenderness, no upper extremity lymphedema Neuro: nonfocal, well oriented, appropriate affect Breasts: Careful palpation does not demonstrate a mass in the left breast. The right breast is unremarkable.Both axillae are benign.Marland Kitchen  LAB RESULTS:  CMP  Component Value Date/Time   NA 140 05/11/2016 1233   K 4.0 05/11/2016 1233   CL 102 01/26/2016 1629   CO2 26 05/11/2016 1233   GLUCOSE 154 (H) 05/11/2016 1233   BUN 7.7 05/11/2016 1233   CREATININE 0.9 05/11/2016 1233   CALCIUM 9.6 05/11/2016 1233   PROT 7.6 05/11/2016 1233   ALBUMIN 4.0 05/11/2016 1233   AST 15 05/11/2016 1233   ALT 16 05/11/2016 1233   ALKPHOS 74 05/11/2016 1233   BILITOT 0.53 05/11/2016 1233    INo results found for: SPEP, UPEP  Lab Results  Component Value Date   WBC 6.6 05/11/2016   NEUTROABS 3.6 05/11/2016   HGB 13.7 05/11/2016   HCT 41.8 05/11/2016   MCV 77.4 (L) 05/11/2016   PLT 189 05/11/2016      Chemistry      Component Value Date/Time   NA 140 05/11/2016  1233   K 4.0 05/11/2016 1233   CL 102 01/26/2016 1629   CO2 26 05/11/2016 1233   BUN 7.7 05/11/2016 1233   CREATININE 0.9 05/11/2016 1233      Component Value Date/Time   CALCIUM 9.6 05/11/2016 1233   ALKPHOS 74 05/11/2016 1233   AST 15 05/11/2016 1233   ALT 16 05/11/2016 1233   BILITOT 0.53 05/11/2016 1233       No results found for: LABCA2  No components found for: LABCA125  No results for input(s): INR in the last 168 hours.  Urinalysis    Component Value Date/Time   COLORURINE yellow 06/01/2006 1116   APPEARANCEUR Clear 06/01/2006 1116   LABSPEC 1.010 06/01/2006 1116   PHURINE 6.5 06/01/2006 1116   HGBUR negative 06/01/2006 1116   BILIRUBINUR negative 06/01/2006 1116   UROBILINOGEN 0.2 06/01/2006 1116   NITRITE negative 06/01/2006 1116     STUDIES: Mr Breast Bilateral W Wo Contrast  Result Date: 05/17/2016 CLINICAL DATA:  Patient with recent diagnosis left breast carcinoma. EXAM: BILATERAL BREAST MRI WITH AND WITHOUT CONTRAST TECHNIQUE: Multiplanar, multisequence MR images of both breasts were obtained prior to and following the intravenous administration of 18 ml of MultiHance. THREE-DIMENSIONAL MR IMAGE RENDERING ON INDEPENDENT WORKSTATION: Three-dimensional MR images were rendered by post-processing of the original MR data on an independent workstation. The three-dimensional MR images were interpreted, and findings are reported in the following complete MRI report for this study. Three dimensional images were evaluated at the independent DynaCad workstation COMPARISON:  Previous exam(s). FINDINGS: Breast composition: a. Almost entirely fat. Background parenchymal enhancement: Mild Right breast: No mass or abnormal enhancement. Left breast: Within the lateral aspect of the left breast posterior depth there is a 3.7 x 1.2 x 1.8 cm lobular enhancing mass compatible a biopsy-proven carcinoma. Within the posterolateral left breast just lateral to the pectoralis muscle there  is a 1.2 cm area of linear non mass enhancement. This is located approximately 2 cm posterior and slightly medial from the previously biopsied mass. Lymph nodes: There are multiple cortically thickened left axillary lymph nodes measuring up to 1.5 cm in size. Ancillary findings:  None. IMPRESSION: Indeterminate 1.2 cm area of linear non mass enhancement within the far posterolateral left breast located approximately 2 cm posterior and medial to the previously biopsied left breast carcinoma. Enhancing mass posterolateral left breast compatible with biopsied carcinoma. Cortically thickened left axillary nodes compatible with metastatic adenopathy. RECOMMENDATION: MRI guided core needle biopsy linear non mass enhancement posterolateral left breast. BI-RADS CATEGORY  4: Suspicious. Electronically Signed   By: Polly Cobia.D.  On: 05/17/2016 15:19   Dg Chest Port 1 View  Result Date: 06/02/2016 CLINICAL DATA:  Port-A-Cath placed by EXAM: PORTABLE CHEST 1 VIEW COMPARISON:  None. FINDINGS: Cardiomediastinal silhouette is unremarkable. Central mild vascular congestion without pulmonary edema. There is right IJ Port-A-Cath with tip in distal SVC. No pneumothorax. IMPRESSION: Right IJ Port-A-Cath in place.  No pneumothorax. Electronically Signed   By: Lahoma Crocker M.D.   On: 06/02/2016 11:16   Dg Fluoro Guide Cv Line-no Report  Result Date: 06/02/2016 Fluoroscopy was utilized by the requesting physician.  No radiographic interpretation.   Mm Clip Placement Left  Result Date: 05/25/2016 CLINICAL DATA:  61 year old female -evaluate clip placement following MR guided left breast biopsy and evaluate placement of new clip directly into the biopsy-proven left breast cancer. EXAM: DIAGNOSTIC LEFT MAMMOGRAM POST MRI BIOPSY COMPARISON:  Previous exam(s). FINDINGS: Mammographic images were obtained following MR guided biopsy of linear enhancement within the lateral far posterior left breast and placement of new clip  directly within the biopsy-proven left breast cancer. The spiral shaped HydroMARK clip is identified far posteriorly on the lateral view corresponding to the MR area of biopsy and is in satisfactory position. The coil shaped clip lies directly in the patient's biopsy-proven left breast cancer and is in satisfactory position. The previously placed ribbon shaped clip at the site of biopsy lies 2 cm lateral to the coil shaped clip. IMPRESSION: Satisfactory position of biopsy clip following MR guided left breast biopsy. Satisfactory position of coil shaped clip directly within the patient's biopsy-proven left breast cancer. Final Assessment: Post Procedure Mammograms for Marker Placement Electronically Signed   By: Margarette Canada M.D.   On: 05/25/2016 12:07   Mr Aundra Millet Breast Bx Johnella Moloney Dev 1st Lesion Image Bx Spec Mr Guide  Addendum Date: 05/27/2016   ADDENDUM REPORT: 05/27/2016 07:29 ADDENDUM: Pathology revealed high grade invasive mammary carcinoma in the left breast. This was found to be concordant by Dr. Hassan Rowan. Pathology results were discussed with the patient by telephone. The patient reported doing well after the biopsy with tenderness at the site. Post biopsy instructions and care were reviewed and questions were answered. The patient was encouraged to call [facility] for any additional concerns. The patient has a recent diagnosis of left breast cancer and should follow her outlined treatment plan. Pathology results reported by Susa Raring RN, BSN on 05/27/2016. Electronically Signed   By: Margarette Canada M.D.   On: 05/27/2016 07:29   Result Date: 05/27/2016 CLINICAL DATA:  61 year old female for tissue sampling of abnormal 1.2 cm linear enhancement within the lateral far posterior left breast adjacent to the pectoralis muscle. Recently diagnosed left breast carcinoma. EXAM: MRI GUIDED CORE NEEDLE BIOPSY OF THE LEFT BREAST TECHNIQUE: Multiplanar, multisequence MR imaging of the left  breast was performed both before and after administration of intravenous contrast. CONTRAST:  43m MULTIHANCE GADOBENATE DIMEGLUMINE 529 MG/ML IV SOLN COMPARISON:  Previous exams. FINDINGS: I met with the patient, and we discussed the procedure of MRI guided biopsy, including risks, benefits, and alternatives. Specifically, we discussed the risks of infection, bleeding, tissue injury, clip migration, and inadequate sampling. Informed, written consent was given. The usual time out protocol was performed immediately prior to the procedure. Using sterile technique, 1% Lidocaine, MRI guidance, and a 9 gauge vacuum assisted device, biopsy was performed of the 1.2 cm area of linear enhancement within the lateral far posterior left breast adjacent to the pectoralis muscle using a lateral approach.  At the conclusion of the procedure, a HydroMARK tissue marker clip was deployed into the biopsy cavity. Follow-up 2-view mammogram was performed and dictated separately. IMPRESSION: MRI guided biopsy of abnormal linear enhancement within the lateral far posterior left breast. No apparent complications. Pathology will be followed. Electronically Signed: By: Margarette Canada M.D. On: 05/25/2016 10:37   [employer] [employer] Breast Loc Dev   1st Lesion  Inc US Guide  Result Date: 05/25/2016 CLINICAL DATA:  Placement of biopsy clip directly into the biopsy-proven left breast cancer prior to neoadjuvant therapy. Clip placed at time of biopsy exhibited lateral migration. EXAM: NEEDLE LOCALIZATION OF THE LEFT BREAST WITH ULTRASOUND GUIDANCE COMPARISON:  Previous exams. FINDINGS: I met with the patient and we discussed the procedure of clip placement including benefits and alternatives. We discussed the high likelihood of a successful procedure. We discussed the risks of the procedure, including infection, bleeding, tissue injury, and further surgery. Informed, written consent was given. The usual time-out protocol was performed immediately prior to  the procedure. Using ultrasound guidance, sterile technique and 1% lidocaine, a coil shaped clip was a directly placed into the biopsy-proven cancer at the 2 o'clock position of the left breast 9 cm from the nipple. Follow-up mammograms performed and dictated in a separate report, but the coil shaped clip is in satisfactory position. IMPRESSION: Satisfactory coil shaped clip placement within the left breast cancer as discussed above. Electronically Signed   By: Margarette Canada M.D.   On: 05/25/2016 12:10    ELIGIBLE FOR AVAILABLE RESEARCH PROTOCOL: None to candidate for Prevent  ASSESSMENT: 61 y.o. [place] woman status post left breast upper inner quadrant and left axillary lymph node biopsy every 16 2018, both showing invasive ductal carcinoma, grade 3, clinically T1c N1a, or stage IIB, estrogen receptor positive, progesterone receptor and HER-2 negative, with an MIB-1 of 40%  (a) MRI biopsy 05/25/2016 of a second area of concern in the left breast also shows invasive ductal carcinoma  (1) neoadjuvant anastrozole started 05/11/2016, held at the start of chemotherapy  (2) Mammaprint results shows the tumor to be luminal B, high risk, indicating a need for chemotherapy.  (3) chemotherapy will consist of doxorubicin and cyclophosphamide in dose dense fashion 4 starting 06/09/2016, followed by weekly paclitaxel 12  (4) breast conserving surgery with targeted axillary dissection to follow  (5) adjuvant radiation as appropriate  (6) adjuvant anti-estrogens to be continued a minimum of 5 years    PLAN: I spent approximately 35 minutes with Tanya [name]  tt her daughterswith most of that time spent discussing her complex problems. We have not been able to move her echocardiogram earlier then to 06/09/2016, so she will have it just before she starts her chemotherapy. We will try to get a preliminary reading at that point.  Today we went over the overall treatment plan including the possible side  effects, toxicities, and complications of cyclophosphamide and doxorubicin. I gave her a "roadmap" on how to take all her supportive medications and all those prescriptions are available to her  She was concerned that since the port was on the right side the cancer, in the left breast, would not be treated. We clarified that and I aslso helped her palpate the port rim so she can tell where to use the numbing cream.  We also entered visits through the next 8 weeks, so she will have the appropriate support while she is completing the more difficult part of her chemotherapy  She will see me one week  after her first cycle to troubleshoot symptoms. I encouraged her to keep a diary of side effects and write all her questions down.  She knows to call for any problems that may develop before her next visit. You can     Chauncey Cruel, MD   06/06/2016 5:07 PM Medical Oncology and Hematology [facility] [address] [place], [place] [address] Tel. [phone]    Fax. [phone]

## 2016-06-07 ENCOUNTER — Other Ambulatory Visit: Payer: BLUE CROSS/BLUE SHIELD

## 2016-06-07 ENCOUNTER — Encounter: Payer: Self-pay | Admitting: Oncology

## 2016-06-07 NOTE — Progress Notes (Signed)
Called pt to introduce myself as her FA and to discuss copay assistance w/ PAF and the [employer] First Step program.  She would like to apply so I will see her on 06/09/16 to complete the applications when she gives me her annual income info.  I will discuss the [place] at that time as well.

## 2016-06-09 ENCOUNTER — Ambulatory Visit (HOSPITAL_BASED_OUTPATIENT_CLINIC_OR_DEPARTMENT_OTHER): Payer: BLUE CROSS/BLUE SHIELD

## 2016-06-09 ENCOUNTER — Other Ambulatory Visit (HOSPITAL_BASED_OUTPATIENT_CLINIC_OR_DEPARTMENT_OTHER): Payer: BLUE CROSS/BLUE SHIELD

## 2016-06-09 ENCOUNTER — Encounter: Payer: Self-pay | Admitting: *Deleted

## 2016-06-09 ENCOUNTER — Telehealth: Payer: Self-pay

## 2016-06-09 ENCOUNTER — Ambulatory Visit (HOSPITAL_COMMUNITY)
Admission: RE | Admit: 2016-06-09 | Discharge: 2016-06-09 | Disposition: A | Discharge status: Home / Self Care | Payer: BLUE CROSS/BLUE SHIELD | Source: Ambulatory Visit | Attending: Oncology | Admitting: Oncology

## 2016-06-09 ENCOUNTER — Encounter: Payer: Self-pay | Admitting: Oncology

## 2016-06-09 DIAGNOSIS — C50412 Malignant neoplasm of upper-outer quadrant of left female breast: Secondary | ICD-10-CM

## 2016-06-09 DIAGNOSIS — Z170 Estrogen receptor positive status [ER+]: Principal | ICD-10-CM

## 2016-06-09 DIAGNOSIS — Z09 Encounter for follow-up examination after completed treatment for conditions other than malignant neoplasm: Secondary | ICD-10-CM | POA: Diagnosis not present

## 2016-06-09 DIAGNOSIS — I10 Essential (primary) hypertension: Secondary | ICD-10-CM | POA: Insufficient documentation

## 2016-06-09 DIAGNOSIS — Z5111 Encounter for antineoplastic chemotherapy: Secondary | ICD-10-CM | POA: Diagnosis not present

## 2016-06-09 DIAGNOSIS — Z5189 Encounter for other specified aftercare: Secondary | ICD-10-CM | POA: Diagnosis not present

## 2016-06-09 DIAGNOSIS — E669 Obesity, unspecified: Secondary | ICD-10-CM | POA: Diagnosis not present

## 2016-06-09 DIAGNOSIS — Z6834 Body mass index (BMI) 34.0-34.9, adult: Secondary | ICD-10-CM | POA: Diagnosis not present

## 2016-06-09 LAB — COMPREHENSIVE METABOLIC PANEL
ALBUMIN: 3.7 g/dL (ref 3.5–5.0)
ALK PHOS: 70 U/L (ref 40–150)
ALT: 15 U/L (ref 0–55)
AST: 13 U/L (ref 5–34)
Anion Gap: 8 mEq/L (ref 3–11)
BUN: 11.5 mg/dL (ref 7.0–26.0)
CHLORIDE: 106 meq/L (ref 98–109)
CO2: 28 meq/L (ref 22–29)
Calcium: 9.3 mg/dL (ref 8.4–10.4)
Creatinine: 0.9 mg/dL (ref 0.6–1.1)
EGFR: 85 mL/min/{1.73_m2} — ABNORMAL LOW (ref >90)
GLUCOSE: 161 mg/dL — AB (ref 70–140)
POTASSIUM: 4.1 meq/L (ref 3.5–5.1)
SODIUM: 142 meq/L (ref 136–145)
Total Bilirubin: 0.54 mg/dL (ref 0.20–1.20)
Total Protein: 6.9 g/dL (ref 6.4–8.3)

## 2016-06-09 MED ORDER — SODIUM CHLORIDE 0.9% FLUSH
10.0000 mL | INTRAVENOUS | Status: DC | PRN
  Administered 2016-06-09: 10 mL
  Filled 2016-06-09: qty 10

## 2016-06-09 MED ORDER — DOXORUBICIN HCL CHEMO IV INJECTION 2 MG/ML
60.0000 mg/m2 | Freq: Once | INTRAVENOUS | Status: AC
  Administered 2016-06-09: 120 mg via INTRAVENOUS
  Filled 2016-06-09: qty 60

## 2016-06-09 MED ORDER — PALONOSETRON HCL INJECTION 0.25 MG/5ML
INTRAVENOUS | Status: AC
  Filled 2016-06-09: qty 5

## 2016-06-09 MED ORDER — PEGFILGRASTIM 6 MG/0.6ML ~~LOC~~ PSKT
6.0000 mg | PREFILLED_SYRINGE | Freq: Once | SUBCUTANEOUS | Status: AC
  Administered 2016-06-09: 6 mg via SUBCUTANEOUS
  Filled 2016-06-09: qty 0.6

## 2016-06-09 MED ORDER — PALONOSETRON HCL INJECTION 0.25 MG/5ML
0.2500 mg | Freq: Once | INTRAVENOUS | Status: AC
  Administered 2016-06-09: 0.25 mg via INTRAVENOUS

## 2016-06-09 MED ORDER — SODIUM CHLORIDE 0.9 % IV SOLN
600.0000 mg/m2 | Freq: Once | INTRAVENOUS | Status: AC
  Administered 2016-06-09: 1200 mg via INTRAVENOUS
  Filled 2016-06-09: qty 60

## 2016-06-09 MED ORDER — SODIUM CHLORIDE 0.9 % IV SOLN
Freq: Once | INTRAVENOUS | Status: AC
  Administered 2016-06-09: 14:00:00 via INTRAVENOUS
  Filled 2016-06-09: qty 5

## 2016-06-09 MED ORDER — SODIUM CHLORIDE 0.9 % IV SOLN
Freq: Once | INTRAVENOUS | Status: AC
  Administered 2016-06-09: 13:00:00 via INTRAVENOUS

## 2016-06-09 MED ORDER — HEPARIN SOD (PORK) LOCK FLUSH 100 UNIT/ML IV SOLN
500.0000 [IU] | Freq: Once | INTRAVENOUS | Status: AC | PRN
  Administered 2016-06-09: 500 [IU]
  Filled 2016-06-09: qty 5

## 2016-06-09 NOTE — Patient Instructions (Addendum)
[place] Discharge Instructions for Patients Receiving Chemotherapy  Today you received the following chemotherapy agents Adriamycin/Cytoxan  To help prevent nausea and vomiting after your treatment, we encourage you to take your nausea medication    If you develop nausea and vomiting that is not controlled by your nausea medication, call the clinic.   BELOW ARE SYMPTOMS THAT SHOULD BE REPORTED IMMEDIATELY:  *FEVER GREATER THAN 100.5 F  *CHILLS WITH OR WITHOUT FEVER  NAUSEA AND VOMITING THAT IS NOT CONTROLLED WITH YOUR NAUSEA MEDICATION  *UNUSUAL SHORTNESS OF BREATH  *UNUSUAL BRUISING OR BLEEDING  TENDERNESS IN MOUTH AND THROAT WITH OR WITHOUT PRESENCE OF ULCERS  *URINARY PROBLEMS  *BOWEL PROBLEMS  UNUSUAL RASH Items with * indicate a potential emergency and should be followed up as soon as possible.  Feel free to call the clinic you have any questions or concerns. The clinic phone number is [phone].  Please show the [facility] at check-in to the Emergency Department and triage nurse.  Cyclophosphamide injection What is this medicine? CYCLOPHOSPHAMIDE (sye kloe FOSS fa mide) is a chemotherapy drug. It slows the growth of cancer cells. This medicine is used to treat many types of cancer like lymphoma, myeloma, leukemia, breast cancer, and ovarian cancer, to name a few. This medicine may be used for other purposes; ask your health care provider or pharmacist if you have questions. COMMON BRAND NAME(S): Cytoxan, Neosar What should I tell my health care provider before I take this medicine? They need to know if you have any of these conditions: -blood disorders -history of other chemotherapy -infection -kidney disease -liver disease -recent or ongoing radiation therapy -tumors in the bone marrow -an unusual or allergic reaction to cyclophosphamide, other chemotherapy, other medicines, foods, dyes, or preservatives -pregnant or trying to get  pregnant -breast-feeding How should I use this medicine? This drug is usually given as an injection into a vein or muscle or by infusion into a vein. It is administered in a hospital or clinic by a specially trained health care professional. Talk to your pediatrician regarding the use of this medicine in children. Special care may be needed. Overdosage: If you think you have taken too much of this medicine contact a poison control center or emergency room at once. NOTE: This medicine is only for you. Do not share this medicine with others. What if I miss a dose? It is important not to miss your dose. Call your doctor or health care professional if you are unable to keep an appointment. What may interact with this medicine? This medicine may interact with the following medications: -amiodarone -amphotericin B -azathioprine -certain antiviral medicines for HIV or AIDS such as protease inhibitors (e.g., indinavir, ritonavir) and zidovudine -certain blood pressure medications such as benazepril, captopril, enalapril, fosinopril, lisinopril, moexipril, monopril, perindopril, quinapril, ramipril, trandolapril -certain cancer medications such as anthracyclines (e.g., daunorubicin, doxorubicin), busulfan, cytarabine, paclitaxel, pentostatin, tamoxifen, trastuzumab -certain diuretics such as chlorothiazide, chlorthalidone, hydrochlorothiazide, indapamide, metolazone -certain medicines that treat or prevent blood clots like warfarin -certain muscle relaxants such as succinylcholine -cyclosporine -etanercept -indomethacin -medicines to increase blood counts like filgrastim, pegfilgrastim, sargramostim -medicines used as general anesthesia -metronidazole -natalizumab This list may not describe all possible interactions. Give your health care provider a list of all the medicines, herbs, non-prescription drugs, or dietary supplements you use. Also tell them if you smoke, drink alcohol, or use illegal  drugs. Some items may interact with your medicine. What should I watch for while using this  medicine? Visit your doctor for checks on your progress. This drug may make you feel generally unwell. This is not uncommon, as chemotherapy can affect healthy cells as well as cancer cells. Report any side effects. Continue your course of treatment even though you feel ill unless your doctor tells you to stop. Drink water or other fluids as directed. Urinate often, even at night. In some cases, you may be given additional medicines to help with side effects. Follow all directions for their use. Call your doctor or health care professional for advice if you get a fever, chills or sore throat, or other symptoms of a cold or flu. Do not treat yourself. This drug decreases your body's ability to fight infections. Try to avoid being around people who are sick. This medicine may increase your risk to bruise or bleed. Call your doctor or health care professional if you notice any unusual bleeding. Be careful brushing and flossing your teeth or using a toothpick because you may get an infection or bleed more easily. If you have any dental work done, tell your dentist you are receiving this medicine. You may get drowsy or dizzy. Do not drive, use machinery, or do anything that needs mental alertness until you know how this medicine affects you. Do not become pregnant while taking this medicine or for 1 year after stopping it. Women should inform their doctor if they wish to become pregnant or think they might be pregnant. Men should not father a child while taking this medicine and for 4 months after stopping it. There is a potential for serious side effects to an unborn child. Talk to your health care professional or pharmacist for more information. Do not breast-feed an infant while taking this medicine. This medicine may interfere with the ability to have a child. This medicine has caused ovarian failure in some women.  This medicine has caused reduced sperm counts in some men. You should talk with your doctor or health care professional if you are concerned about your fertility. If you are going to have surgery, tell your doctor or health care professional that you have taken this medicine. What side effects may I notice from receiving this medicine? Side effects that you should report to your doctor or health care professional as soon as possible: -allergic reactions like skin rash, itching or hives, swelling of the face, lips, or tongue -low blood counts - this medicine may decrease the number of white blood cells, red blood cells and platelets. You may be at increased risk for infections and bleeding. -signs of infection - fever or chills, cough, sore throat, pain or difficulty passing urine -signs of decreased platelets or bleeding - bruising, pinpoint red spots on the skin, black, tarry stools, blood in the urine -signs of decreased red blood cells - unusually weak or tired, fainting spells, lightheadedness -breathing problems -dark urine -dizziness -palpitations -swelling of the ankles, feet, hands -trouble passing urine or change in the amount of urine -weight gain -yellowing of the eyes or skin Side effects that usually do not require medical attention (report to your doctor or health care professional if they continue or are bothersome): -changes in nail or skin color -hair loss -missed menstrual periods -mouth sores -nausea, vomiting This list may not describe all possible side effects. Call your doctor for medical advice about side effects. You may report side effects to FDA at 1-800-FDA-1088. Where should I keep my medicine? This drug is given in a hospital or clinic and will  not be stored at home. NOTE: This sheet is a summary. It may not cover all possible information. If you have questions about this medicine, talk to your doctor, pharmacist, or health care provider.  2018 [employer]  Standard (2012-01-13 16:22:58)   Doxorubicin injection What is this medicine? DOXORUBICIN (dox oh ROO bi sin) is a chemotherapy drug. It is used to treat many kinds of cancer like leukemia, lymphoma, neuroblastoma, sarcoma, and Wilms' tumor. It is also used to treat bladder cancer, breast cancer, lung cancer, ovarian cancer, stomach cancer, and thyroid cancer. This medicine may be used for other purposes; ask your health care provider or pharmacist if you have questions. COMMON BRAND NAME(S): Adriamycin, Adriamycin PFS, Adriamycin RDF, Rubex What should I tell my health care provider before I take this medicine? They need to know if you have any of these conditions: -heart disease -history of low blood counts caused by a medicine -liver disease -recent or ongoing radiation therapy -an unusual or allergic reaction to doxorubicin, other chemotherapy agents, other medicines, foods, dyes, or preservatives -pregnant or trying to get pregnant -breast-feeding How should I use this medicine? This drug is given as an infusion into a vein. It is administered in a hospital or clinic by a specially trained health care professional. If you have pain, swelling, burning or any unusual feeling around the site of your injection, tell your health care professional right away. Talk to your pediatrician regarding the use of this medicine in children. Special care may be needed. Overdosage: If you think you have taken too much of this medicine contact a poison control center or emergency room at once. NOTE: This medicine is only for you. Do not share this medicine with others. What if I miss a dose? It is important not to miss your dose. Call your doctor or health care professional if you are unable to keep an appointment. What may interact with this medicine? This medicine may interact with the following medications: -6-mercaptopurine -paclitaxel -phenytoin -St. John's Wort -trastuzumab -verapamil This  list may not describe all possible interactions. Give your health care provider a list of all the medicines, herbs, non-prescription drugs, or dietary supplements you use. Also tell them if you smoke, drink alcohol, or use illegal drugs. Some items may interact with your medicine. What should I watch for while using this medicine? This drug may make you feel generally unwell. This is not uncommon, as chemotherapy can affect healthy cells as well as cancer cells. Report any side effects. Continue your course of treatment even though you feel ill unless your doctor tells you to stop. There is a maximum amount of this medicine you should receive throughout your life. The amount depends on the medical condition being treated and your overall health. Your doctor will watch how much of this medicine you receive in your lifetime. Tell your doctor if you have taken this medicine before. You may need blood work done while you are taking this medicine. Your urine may turn red for a few days after your dose. This is not blood. If your urine is dark or brown, call your doctor. In some cases, you may be given additional medicines to help with side effects. Follow all directions for their use. Call your doctor or health care professional for advice if you get a fever, chills or sore throat, or other symptoms of a cold or flu. Do not treat yourself. This drug decreases your body's ability to fight infections. Try to avoid being around people  who are sick. This medicine may increase your risk to bruise or bleed. Call your doctor or health care professional if you notice any unusual bleeding. Talk to your doctor about your risk of cancer. You may be more at risk for certain types of cancers if you take this medicine. Do not become pregnant while taking this medicine or for 6 months after stopping it. Women should inform their doctor if they wish to become pregnant or think they might be pregnant. Men should not father a  child while taking this medicine and for 6 months after stopping it. There is a potential for serious side effects to an unborn child. Talk to your health care professional or pharmacist for more information. Do not breast-feed an infant while taking this medicine. This medicine has caused ovarian failure in some women and reduced sperm counts in some men This medicine may interfere with the ability to have a child. Talk with your doctor or health care professional if you are concerned about your fertility. What side effects may I notice from receiving this medicine? Side effects that you should report to your doctor or health care professional as soon as possible: -allergic reactions like skin rash, itching or hives, swelling of the face, lips, or tongue -breathing problems -chest pain -fast or irregular heartbeat -low blood counts - this medicine may decrease the number of white blood cells, red blood cells and platelets. You may be at increased risk for infections and bleeding. -pain, redness, or irritation at site where injected -signs of infection - fever or chills, cough, sore throat, pain or difficulty passing urine -signs of decreased platelets or bleeding - bruising, pinpoint red spots on the skin, black, tarry stools, blood in the urine -swelling of the ankles, feet, hands -tiredness -weakness Side effects that usually do not require medical attention (report to your doctor or health care professional if they continue or are bothersome): -diarrhea -hair loss -mouth sores -nail discoloration or damage -nausea -red colored urine -vomiting This list may not describe all possible side effects. Call your doctor for medical advice about side effects. You may report side effects to FDA at 1-800-FDA-1088. Where should I keep my medicine? This drug is given in a hospital or clinic and will not be stored at home. NOTE: This sheet is a summary. It may not cover all possible information. If  you have questions about this medicine, talk to your doctor, pharmacist, or health care provider.  2018 [employer]/Gold Standard (2015-04-27 11:28:51)

## 2016-06-09 NOTE — Progress Notes (Signed)
Met w/ pt, she informed me that she contacted her insurance and she has met her deductible so her treatment should be covered at 100% so she would not like to apply for the grants at this time.  We also discussed the [employer] and I gave her an expense sheet.  She would like to think about it and will let me know if she would like to apply.

## 2016-06-09 NOTE — Progress Notes (Signed)
  Echocardiogram 2D Echocardiogram has been performed.  Tanya [name] 06/09/2016, 9:44 AM

## 2016-06-11 ENCOUNTER — Ambulatory Visit: Payer: BLUE CROSS/BLUE SHIELD

## 2016-06-14 ENCOUNTER — Telehealth: Payer: Self-pay

## 2016-06-14 NOTE — Telephone Encounter (Signed)
-----   Message from Azzie Glatter, RN sent at 06/09/2016  4:31 PM EDT ----- Regarding: "1st time chemotherapy---per Dr. Jana Hakim" Patient received Adriamycin and Cytoxan for the first time today, per Dr. Jana Hakim.  Patient tolerated treatment well with no complaints.  Home with Neulasta Onpro.

## 2016-06-14 NOTE — Telephone Encounter (Signed)
lvm chemo follow up. Making sure she is able to eat, is drinking plenty no nausea constipation or diarrhea, call if any problems.

## 2016-06-16 NOTE — Progress Notes (Signed)
[place]  [phone] [phone]  Clinic Follow Up Note   Patient Care Team: Everrett Coombe, MD as PCP - General Rolm Bookbinder, MD as Consulting Physician (General Surgery) Chauncey Cruel, MD as Consulting Physician (Oncology) Kyung Rudd, MD as Consulting Physician (Radiation Oncology) 06/23/2016  CHIEF COMPLAINTS:  Estrogen receptor positive breast cancer   CURRENT THERAPY: 2nd cycle Neoadjuvant chemotherapy AC  HISTORY OF PRESENTING ILLNESS:   Tanya [name], 61 y.o. had routine bilateral screening mammography at the [facility] 04/19/2016. This showed a possible area of the symmetry in the left breast and a prominent left axillary lymph nodes. On 04/28/2016 she underwent diagnostic left mammography with tomography and left breast ultrasonography. This showed the breast density to be category B. There was an obscured mass in the upper outer left breast and an enlarged left axillary lymph node. On exam there was focal thickening at the 2:00 position of the left breast 9 cm from the nipple. Targeted ultrasonography confirmed a 1.8 cm hypoechoic mass with a second adjacent 1.1 cm mass both at the 2:00 position of the left breast 9 cm from the nipple. The conglomerate area measured 3.5 cm. There was also an enlarged left axillary lymph node with loss of fatty hilum measuring 1.8 cm.  On Apr 29 2016 the patient underwent biopsy of the larger left breast mass in question and the suspicious left axillary lymph node. 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   INTERVAL HISTORY: She presents to the clinic today for 2nd cycle chemotherapy. I am covering Dr. Jana Hakim to see her. She is accompanied by her husband. Her experience after her  1st cycle chemo was not as bad as she assumed. She experienced mild nausea where she still ate, and fatigue. Denies fever and bleeding. Her eyes were watery, but her vision was fine, a shooting sensation in lower back, which has resolved, "pinching" experience throughout her body and a temperature of 95.5 which was checked orally, palm discoloriation and dry mouth  Patient has a boil in her private area, she reports antibiotics did help some, but it's not gone yet.    MEDICAL HISTORY:  Past Medical History:  Diagnosis Date  . ANXIETY   . Dyspnea on exertion   . HYPERLIPIDEMIA   . HYPERTENSION, BENIGN ESSENTIAL   . Malignant neoplasm of upper-outer quadrant of left breast in female, estrogen receptor positive ([place]) 05/04/2016  . OBESITY     SURGICAL HISTORY: Past Surgical History:  Procedure Laterality Date  . CESAREAN SECTION    . PORTACATH PLACEMENT Right 06/02/2016   Procedure: INSERTION PORT-A-CATH WITH ULTRASOUND GUIDANCE;  Surgeon: Rolm Bookbinder, MD;  Location: [facility];  Service: General;  Laterality: Right;  . TUBAL LIGATION      SOCIAL HISTORY: Social History   Social History  . Marital status: Married    Spouse name: N/A  . Number of children: N/A  . Years of education: N/A   Occupational History  . Not on file.   Social History Main Topics  . Smoking status: Never Smoker  . Smokeless tobacco: Never Used  . Alcohol use Yes     Comment: rarely  . Drug use: No  . Sexual activity: Not on file   Other Topics Concern  . Not on file  Social History Narrative  . No narrative on file    FAMILY HISTORY: Family History  Problem Relation Age of Onset  . Breast cancer Mother 48    ALLERGIES:  is allergic to camoquin [amodiaquine].  MEDICATIONS:  Current Outpatient Prescriptions  Medication Sig Dispense Refill  . dexamethasone (DECADRON) 4 MG tablet Take 2 tablets by mouth once a day on the day after chemotherapy and then take 2 tablets  two times a day for 2 days. Take with food. 30 tablet 1  . lidocaine-prilocaine (EMLA) cream Apply to affected area once 30 g 3  . loratadine (CLARITIN) 10 MG tablet Take 1 tablet (10 mg total) by mouth daily. (Patient taking differently: Take 10 mg by mouth daily. Takes with neulasta) 30 tablet 3  . LORazepam (ATIVAN) 0.5 MG tablet Take 1 tablet (0.5 mg total) by mouth at bedtime as needed (Nausea or vomiting). 30 tablet 0  . losartan (COZAAR) 50 MG tablet Take 1 tablet (50 mg total) by mouth daily. 90 tablet 2  . pravastatin (PRAVACHOL) 40 MG tablet Take 1 tablet (40 mg total) by mouth at bedtime. 90 tablet 2  . prochlorperazine (COMPAZINE) 10 MG tablet Take 1 tablet (10 mg total) by mouth every 6 (six) hours as needed (Nausea or vomiting). 30 tablet 1   No current facility-administered medications for this visit.     REVIEW OF SYSTEMS:   Constitutional: Denies fevers, chills or abnormal night sweats Eyes: Denies blurriness of vision, double vision or watery eyes Ears, nose, mouth, throat, and face: Denies mucositis or sore throat Respiratory: Denies cough, dyspnea or wheezes Cardiovascular: Denies palpitation, chest discomfort or lower extremity swelling Gastrointestinal:  Denies nausea, heartburn or change in bowel habits Skin: Denies abnormal skin rashes Lymphatics: Denies new lymphadenopathy or easy bruising Neurological:Denies numbness, tingling or new weaknesses Behavioral/Psych: Mood is stable, no new changes  All other systems were reviewed with the patient and are negative.  PHYSICAL EXAMINATION: ECOG PERFORMANCE STATUS: 1 - Symptomatic but completely ambulatory  Vitals:   06/23/16 1001  BP: (!) 175/89  Pulse: 96  Resp: 18  Temp: 98.6 F (37 C)   Filed Weights   06/23/16 1001  Weight: 193 lb (87.5 kg)    GENERAL:alert, no distress and comfortable SKIN: skin color, texture, turgor are normal, no rashes or significant lesions EYES: normal, conjunctiva are pink and  non-injected, sclera clear OROPHARYNX:no exudate, no erythema and lips, buccal mucosa, and tongue normal  NECK: supple, thyroid normal size, non-tender, without nodularity LYMPH:  no palpable lymphadenopathy in the cervical, axillary or inguinal LUNGS: clear to auscultation and percussion with normal breathing effort HEART: regular rate & rhythm and no murmurs and no lower extremity edema ABDOMEN:abdomen soft, non-tender and normal bowel sounds. 2 small folliculitis skin nodules at perianal area, resolving, no sings of abscess or surrounding skin erythema. Musculoskeletal:no cyanosis of digits and no clubbing  PSYCH: alert & oriented x 3 with fluent speech NEURO: no focal motor/sensory deficits  LABORATORY DATA:  I have reviewed the data as listed CBC Latest Ref Rng & Units 06/23/2016 06/17/2016 05/11/2016  WBC 3.9 - 10.3 10e3/uL 9.1 2.3(L) 6.6  Hemoglobin 11.6 - 15.9 g/dL 12.3 13.0 13.7  Hematocrit 34.8 - 46.6 % 38.0 40.0 41.8  Platelets 145 - 400 10e3/uL 174 65 Few Large platelets present(L) 189    CMP Latest Ref Rng & Units 06/23/2016 06/17/2016 06/09/2016  Glucose 70 - 140 mg/dl 137 121 161(H)  BUN 7.0 - 26.0 mg/dL 11.9 13.4 11.5  Creatinine 0.6 - 1.1 mg/dL 0.8 0.8 0.9  Sodium 136 - 145 mEq/L 143 140 142  Potassium 3.5 - 5.1 mEq/L 4.1 4.3 4.1  Chloride 98 - 110 mmol/L - - -  CO2 22 - 29 mEq/L _0 Calcium 8.4 - 10.4 mg/dL 9.1 9.1 9.3  Total Protein 6.4 - 8.3 g/dL 6.4 6.7 6.9  Total Bilirubin 0.20 - 1.20 mg/dL 0.31 0.49 0.54  Alkaline Phos 40 - 150 U/L 83 82 70  AST 5 - 34 U/L _1 ALT 0 - 55 U/L 21 39 15     RADIOGRAPHIC STUDIES: I have personally reviewed the radiological images as listed and agreed with the findings in the report. Dg Chest Port 1 View  Result Date: 06/02/2016 CLINICAL DATA:  Port-A-Cath placed by EXAM: PORTABLE CHEST 1 VIEW COMPARISON:  None. FINDINGS: Cardiomediastinal silhouette is unremarkable. Central mild vascular congestion without pulmonary  edema. There is right IJ Port-A-Cath with tip in distal SVC. No pneumothorax. IMPRESSION: Right IJ Port-A-Cath in place.  No pneumothorax. Electronically Signed   By: Lahoma Crocker M.D.   On: 06/02/2016 11:16   Dg Fluoro Guide Cv Line-no Report  Result Date: 06/02/2016 Fluoroscopy was utilized by the requesting physician.  No radiographic interpretation.   Mm Clip Placement Left  Result Date: 05/25/2016 CLINICAL DATA:  61 year old female -evaluate clip placement following MR guided left breast biopsy and evaluate placement of new clip directly into the biopsy-proven left breast cancer. EXAM: DIAGNOSTIC LEFT MAMMOGRAM POST MRI BIOPSY COMPARISON:  Previous exam(s). FINDINGS: Mammographic images were obtained following MR guided biopsy of linear enhancement within the lateral far posterior left breast and placement of new clip directly within the biopsy-proven left breast cancer. The spiral shaped HydroMARK clip is identified far posteriorly on the lateral view corresponding to the MR area of biopsy and is in satisfactory position. The coil shaped clip lies directly in the patient's biopsy-proven left breast cancer and is in satisfactory position. The previously placed ribbon shaped clip at the site of biopsy lies 2 cm lateral to the coil shaped clip. IMPRESSION: Satisfactory position of biopsy clip following MR guided left breast biopsy. Satisfactory position of coil shaped clip directly within the patient's biopsy-proven left breast cancer. Final Assessment: Post Procedure Mammograms for Marker Placement Electronically Signed   By: Margarette Canada M.D.   On: 05/25/2016 12:07   Mr Aundra Millet Breast Bx Johnella Moloney Dev 1st Lesion Image Bx Spec Mr Guide  Addendum Date: 05/27/2016   ADDENDUM REPORT: 05/27/2016 07:29 ADDENDUM: Pathology revealed high grade invasive mammary carcinoma in the left breast. This was found to be concordant by Dr. Hassan Rowan. Pathology results were discussed with the patient by telephone. The patient  reported doing well after the biopsy with tenderness at the site. Post biopsy instructions and care were reviewed and questions were answered. The patient was encouraged to call [facility] for any additional concerns. The patient has a recent diagnosis of left breast cancer and should follow her outlined treatment plan. Pathology results reported by Susa Raring RN, BSN on 05/27/2016. Electronically Signed   By: Margarette Canada M.D.   On: 05/27/2016 07:29   Result Date: 05/27/2016 CLINICAL DATA:  61 year old female for tissue sampling of abnormal 1.2 cm linear enhancement within the lateral far posterior left breast adjacent to the pectoralis muscle. Recently diagnosed left breast carcinoma. EXAM: MRI GUIDED CORE NEEDLE BIOPSY OF THE LEFT BREAST TECHNIQUE: Multiplanar, multisequence MR imaging of the  left breast was performed both before and after administration of intravenous contrast. CONTRAST:  77m MULTIHANCE GADOBENATE DIMEGLUMINE 529 MG/ML IV SOLN COMPARISON:  Previous exams. FINDINGS: I met with the patient, and we discussed the procedure of MRI guided biopsy, including risks, benefits, and alternatives. Specifically, we discussed the risks of infection, bleeding, tissue injury, clip migration, and inadequate sampling. Informed, written consent was given. The usual time out protocol was performed immediately prior to the procedure. Using sterile technique, 1% Lidocaine, MRI guidance, and a 9 gauge vacuum assisted device, biopsy was performed of the 1.2 cm area of linear enhancement within the lateral far posterior left breast adjacent to the pectoralis muscle using a lateral approach. At the conclusion of the procedure, a HydroMARK tissue marker clip was deployed into the biopsy cavity. Follow-up 2-view mammogram was performed and dictated separately. IMPRESSION: MRI guided biopsy of abnormal linear enhancement within the lateral far posterior left breast. No apparent complications.  Pathology will be followed. Electronically Signed: By: JMargarette CanadaM.D. On: 05/25/2016 10:37   [place] Plc Breast Loc Dev   1st Lesion  Inc [place]Guide  Result Date: 05/25/2016 CLINICAL DATA:  Placement of biopsy clip directly into the biopsy-proven left breast cancer prior to neoadjuvant therapy. Clip placed at time of biopsy exhibited lateral migration. EXAM: NEEDLE LOCALIZATION OF THE LEFT BREAST WITH ULTRASOUND GUIDANCE COMPARISON:  Previous exams. FINDINGS: I met with the patient and we discussed the procedure of clip placement including benefits and alternatives. We discussed the high likelihood of a successful procedure. We discussed the risks of the procedure, including infection, bleeding, tissue injury, and further surgery. Informed, written consent was given. The usual time-out protocol was performed immediately prior to the procedure. Using ultrasound guidance, sterile technique and 1% lidocaine, a coil shaped clip was a directly placed into the biopsy-proven cancer at the 2 o'clock position of the left breast 9 cm from the nipple. Follow-up mammograms performed and dictated in a separate report, but the coil shaped clip is in satisfactory position. IMPRESSION: Satisfactory coil shaped clip placement within the left breast cancer as discussed above. Electronically Signed   By: JMargarette CanadaM.D.   On: 05/25/2016 12:10    ASSESSMENT & PLAN: 61yo female    1. 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  -she started Neoadjuvant anastrozole started on 05/11/16, held at the start of chemo -she is currently on neoadjuvant chemo AC-T, due to her high risk and node posiitve disease.  -She tolerated her first cycle chemotherapy well overall, with mild nausea and fatigue. -She  developed a significant neutropenia and thrombocytopenia after first cycle chemotherapy, no complications, cytopenia has resolved well today. -Lab reviewed, adequate for treatment, we'll proceed to cycle 2 Adriamycin and Cytoxan today with Neulasta on day 2. -We again reviewed potential side effects from chemotherapy in the management strategy -She is scheduled to see Dr. MJana Hakimnext week for follow-up.  2. Perianal folliculitis -Resolving, she has completed a course of antibiotics. I encouraged her to continue warm compression and topical steroids.  3. Neutropenia and thrombocytopenia -Secondary to chemotherapy, resolved now. -She'll continue Neulasta after chemotherapy.  4. HTN -she is on losartan -BP high today, we'll repeat in infusion room. -I encouraged her to monitor blood pressure at home, and follow-up with  her primary care physician.  Plan  - labs reviewed, adequate for treatment, we'll proceed cycle 2 AC today with neulasta tomorrow  -she is scheduled to see Dr. Jana Hakim next week.    No orders of the defined types were placed in this encounter.   All questions were answered. The patient knows to call the clinic with any problems, questions or concerns. I spent 20 minutes counseling the patient face to face. The total time spent in the appointment was 25 minutes and more than 50% was on counseling.   This document serves as a record of services personally performed by Truitt Merle, MD. It was created on her behalf by Brandt Loosen, a trained medical scribe. The creation of this record is based on the scribe's personal observations and the provider's statements to them. This document has been checked and approved by the attending provider.   Truitt Merle, MD 06/23/2016

## 2016-06-17 ENCOUNTER — Ambulatory Visit (HOSPITAL_BASED_OUTPATIENT_CLINIC_OR_DEPARTMENT_OTHER): Payer: BLUE CROSS/BLUE SHIELD | Admitting: Oncology

## 2016-06-17 ENCOUNTER — Other Ambulatory Visit (HOSPITAL_BASED_OUTPATIENT_CLINIC_OR_DEPARTMENT_OTHER): Payer: BLUE CROSS/BLUE SHIELD

## 2016-06-17 VITALS — BP 144/89 | HR 95 | Temp 99.1°F | Resp 18 | Ht 63.0 in | Wt 190.1 lb

## 2016-06-17 DIAGNOSIS — C773 Secondary and unspecified malignant neoplasm of axilla and upper limb lymph nodes: Secondary | ICD-10-CM | POA: Diagnosis not present

## 2016-06-17 DIAGNOSIS — Z170 Estrogen receptor positive status [ER+]: Principal | ICD-10-CM

## 2016-06-17 DIAGNOSIS — C50412 Malignant neoplasm of upper-outer quadrant of left female breast: Secondary | ICD-10-CM | POA: Diagnosis not present

## 2016-06-17 LAB — CBC WITH DIFFERENTIAL/PLATELET
BASO%: 0.5 % (ref 0.0–2.0)
Basophils Absolute: 0 10*3/uL (ref 0.0–0.1)
EOS%: 3.6 % (ref 0.0–7.0)
Eosinophils Absolute: 0.1 10*3/uL (ref 0.0–0.5)
HCT: 40 % (ref 34.8–46.6)
HGB: 13 g/dL (ref 11.6–15.9)
LYMPH%: 50 % — AB (ref 14.0–49.7)
MCH: 25.4 pg (ref 25.1–34.0)
MCHC: 32.4 g/dL (ref 31.5–36.0)
MCV: 78.2 fL — ABNORMAL LOW (ref 79.5–101.0)
MONO#: 0.1 10*3/uL (ref 0.1–0.9)
MONO%: 6.3 % (ref 0.0–14.0)
NEUT%: 39.6 % (ref 38.4–76.8)
NEUTROS ABS: 0.9 10*3/uL — AB (ref 1.5–6.5)
Platelets: 65 10*3/uL — ABNORMAL LOW (ref 145–400)
RBC: 5.12 10*6/uL (ref 3.70–5.45)
RDW: 15.6 % — ABNORMAL HIGH (ref 11.2–14.5)
WBC: 2.3 10*3/uL — AB (ref 3.9–10.3)
lymph#: 1.1 10*3/uL (ref 0.9–3.3)

## 2016-06-17 LAB — COMPREHENSIVE METABOLIC PANEL
ALBUMIN: 3.7 g/dL (ref 3.5–5.0)
ALK PHOS: 82 U/L (ref 40–150)
ALT: 39 U/L (ref 0–55)
AST: 15 U/L (ref 5–34)
Anion Gap: 8 mEq/L (ref 3–11)
BUN: 13.4 mg/dL (ref 7.0–26.0)
CALCIUM: 9.1 mg/dL (ref 8.4–10.4)
CO2: 27 mEq/L (ref 22–29)
CREATININE: 0.8 mg/dL (ref 0.6–1.1)
Chloride: 105 mEq/L (ref 98–109)
EGFR: >90 mL/min/{1.73_m2} (ref >90)
Glucose: 121 mg/dl (ref 70–140)
POTASSIUM: 4.3 meq/L (ref 3.5–5.1)
Sodium: 140 mEq/L (ref 136–145)
TOTAL PROTEIN: 6.7 g/dL (ref 6.4–8.3)
Total Bilirubin: 0.49 mg/dL (ref 0.20–1.20)

## 2016-06-17 MED ORDER — CEPHALEXIN 500 MG PO CAPS: 500.0000 mg | ORAL_CAPSULE | Freq: Two times a day (BID) | ORAL | 0 refills | Status: DC

## 2016-06-17 NOTE — Progress Notes (Signed)
[place]  [phone]) [phone] [phone]     ID: Rimsha Trembley DOB: May 05, 1955  MR#: [id number]  VJK#:[id number]  Patient Care Team: Everrett Coombe, MD as PCP - General Rolm Bookbinder, MD as Consulting Physician (General Surgery) Chauncey Cruel, MD as Consulting Physician (Oncology) Kyung Rudd, MD as Consulting Physician (Radiation Oncology) Chauncey Cruel, MD OTHER MD:  CHIEF COMPLAINT: Estrogen receptor positive breast cancer  CURRENT TREATMENT: Neoadjuvant chemotherapy   BREAST CANCER HISTORY: From the original intake note:  Corin had routine bilateral screening mammography at the [facility] 04/19/2016. This showed a possible area of the symmetry in the left breast and a prominent left axillary lymph nodes. On 04/28/2016 she underwent diagnostic left mammography with tomography and left breast ultrasonography. This showed the breast density to be category B. There was an obscured mass in the upper outer left breast and an enlarged left axillary lymph node. On exam there was focal thickening at the 2:00 position of the left breast 9 cm from the nipple. Targeted ultrasonography confirmed a 1.8 cm hypoechoic mass with a second adjacent 1.1 cm mass both at the 2:00 position of the left breast 9 cm from the nipple. The conglomerate area measured 3.5 cm. There was also an enlarged left axillary lymph node with loss of fatty hilum measuring 1.8 cm.  On family 16 2018 the patient underwent biopsy of the larger left breast mass in question and the suspicious left axillary lymph node. 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  The patient's subsequent history is as detailed below  INTERVAL HISTORY: Hava returns today for  follow-up of her estrogen receptor positive breast cancer accompanied by one of her daughters. She is being treated neoadjuvantly with cyclophosphamide and doxorubicin given in dose dense fashion. Today is day 8 cycle 1 of 4 planned cycles of this chemotherapy to be followed by taxanes weekly 12  She tells me she had a "boil" in her private parts and she wanted me to look at that today. She has had no fever.  REVIEW OF SYSTEMS: Panayiota did remarkably well with her treatment. She had no significant nausea, no mouth sores, and certainly no fevers. Her port worked well. She was able to go back to work the day after chemotherapy. She is not exercising regularly, as indeed she was not prior to the start of treatment. She has mild fatigue, mild insomnia, some heartburn, and rare headaches. None of these symptoms are new. A detailed review of systems today was otherwise stable.  PAST MEDICAL HISTORY: Past Medical History:  Diagnosis Date  . ANXIETY   . Dyspnea on exertion   . HYPERLIPIDEMIA   . HYPERTENSION, BENIGN ESSENTIAL   . Malignant neoplasm of upper-outer quadrant of left breast in female, estrogen receptor positive ([place]) 05/04/2016  . OBESITY     PAST SURGICAL HISTORY: Past Surgical History:  Procedure Laterality Date  . CESAREAN SECTION    . PORTACATH PLACEMENT Right 06/02/2016   Procedure: INSERTION PORT-A-CATH WITH ULTRASOUND GUIDANCE;  Surgeon: Rolm Bookbinder, MD;  Location: [facility];  Service: General;  Laterality: Right;  . TUBAL LIGATION      FAMILY HISTORY Family History  Problem Relation Age of Onset  . Breast cancer Mother 41  The patient's father had a history of prostate cancer. He  died at age 44. The patient's mother died at age 28 from unclear causes. She had a history of breast cancer diagnosed in her 61s. The patient had 4 brothers, 2 sisters. There is no other history of breast or ovarian cancer in the family to the patient's  knowledge  GYNECOLOGIC HISTORY:  No LMP recorded. Patient is postmenopausal. Menarche age 67, first live birth age 64, the patient is [place] P4. She stopped having periods approximately 2006. She did not use hormone replacement. She used oral contraceptives briefly and remotely, with no complications.  SOCIAL HISTORY:  The patient is originally from [name]. 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 The patient's husband [name] was also a professor at [employer], where he taught agriculture oh research. Son [name] works in Land, son [name] works for [employer], daughter [name] is a Education officer, museum, and daughter [name] is also a Education officer, museum, all living in [place]. The patient has 3 grandchildren. She attends [employer]    ADVANCED DIRECTIVES: Not in place   HEALTH MAINTENANCE: Social History  Substance Use Topics  . Smoking status: Never Smoker  . Smokeless tobacco: Never Used  . Alcohol use Yes     Comment: rarely     Colonoscopy:Never  PAP: November 2017  Bone density: Never   Allergies  Allergen Reactions  . Camoquin [Amodiaquine] Itching    Current Outpatient Prescriptions  Medication Sig Dispense Refill  . anastrozole (ARIMIDEX) 1 MG tablet Take 1 tablet (1 mg total) by mouth daily. 90 tablet 4  . dexamethasone (DECADRON) 4 MG tablet Take 2 tablets by mouth once a day on the day after chemotherapy and then take 2 tablets two times a day for 2 days. Take with food. 30 tablet 1  . lidocaine-prilocaine (EMLA) cream Apply to affected area once 30 g 3  . loratadine (CLARITIN) 10 MG tablet Take 1 tablet (10 mg total) by mouth daily. 30 tablet 3  . LORazepam (ATIVAN) 0.5 MG tablet Take 1 tablet (0.5 mg total) by mouth at bedtime as needed for anxiety. 30 tablet 0  . LORazepam (ATIVAN) 0.5 MG tablet Take 1 tablet (0.5 mg total) by mouth at bedtime as needed (Nausea or vomiting). 30 tablet 0  . losartan (COZAAR) 50 MG tablet Take 1 tablet (50 mg total) by  mouth daily. 90 tablet 2  . pravastatin (PRAVACHOL) 40 MG tablet Take 1 tablet (40 mg total) by mouth at bedtime. 90 tablet 2  . prochlorperazine (COMPAZINE) 10 MG tablet Take 1 tablet (10 mg total) by mouth every 6 (six) hours as needed (Nausea or vomiting). 30 tablet 1   No current facility-administered medications for this visit.     OBJECTIVE: Middle-aged African-American woman Who appears stated age  36:   06/17/16 0830  BP: (!) 144/89  Pulse: 95  Resp: 18  Temp: 99.1 F (37.3 C)     Body mass index is 33.67 kg/m.    ECOG FS:1 - Symptomatic but completely ambulatory  Sclerae unicteric, EOMs intact Oropharynx clear and moist No cervical or supraclavicular adenopathy Lungs no rales or rhonchi Heart regular rate and rhythm Abd soft, obese, nontender, positive bowel sounds Perineum: The "boil" that she is feeling is actually a small erosion, measuring three quarters of a centimeter, in the inferior aspect of the left gluteal fold, with no accompanying swelling or erythema. MSK no focal spinal tenderness, no upper extremity lymphedema Neuro: nonfocal, well oriented, appropriate affect Breasts: Right breast is benign; no masses palpated  left breast; both axillae are benign   LAB RESULTS:  CMP     Component Value Date/Time   NA 142 06/09/2016 1021   K 4.1 06/09/2016 1021   CL 102 01/26/2016 1629   CO2 28 06/09/2016 1021   GLUCOSE 161 (H) 06/09/2016 1021   BUN 11.5 06/09/2016 1021   CREATININE 0.9 06/09/2016 1021   CALCIUM 9.3 06/09/2016 1021   PROT 6.9 06/09/2016 1021   ALBUMIN 3.7 06/09/2016 1021   AST 13 06/09/2016 1021   ALT 15 06/09/2016 1021   ALKPHOS 70 06/09/2016 1021   BILITOT 0.54 06/09/2016 1021    INo results found for: SPEP, UPEP  CBC    Component Value Date/Time   WBC 2.3 (L) 06/17/2016 0939   WBC 7.6 01/23/2014 1431   RBC 5.12 06/17/2016 0939   RBC 5.41 (H) 01/23/2014 1431   HGB 13.0 06/17/2016 0939   HCT 40.0 06/17/2016 0939   PLT 65 Few  Large platelets present (L) 06/17/2016 0939   MCV 78.2 (L) 06/17/2016 0939   MCH 25.4 06/17/2016 0939   MCH 25.0 (L) 01/23/2014 1431   MCHC 32.4 06/17/2016 0939   MCHC 33.8 01/23/2014 1431   RDW 15.6 (H) 06/17/2016 0939   LYMPHSABS 1.1 06/17/2016 0939   MONOABS 0.1 06/17/2016 0939   EOSABS 0.1 06/17/2016 0939   BASOSABS 0.0 06/17/2016 0939        Chemistry      Component Value Date/Time   NA 142 06/09/2016 1021   K 4.1 06/09/2016 1021   CL 102 01/26/2016 1629   CO2 28 06/09/2016 1021   BUN 11.5 06/09/2016 1021   CREATININE 0.9 06/09/2016 1021      Component Value Date/Time   CALCIUM 9.3 06/09/2016 1021   ALKPHOS 70 06/09/2016 1021   AST 13 06/09/2016 1021   ALT 15 06/09/2016 1021   BILITOT 0.54 06/09/2016 1021       No results found for: LABCA2  No components found for: LABCA125  No results for input(s): INR in the last 168 hours.  Urinalysis    Component Value Date/Time   COLORURINE yellow 06/01/2006 1116   APPEARANCEUR Clear 06/01/2006 1116   LABSPEC 1.010 06/01/2006 1116   PHURINE 6.5 06/01/2006 1116   HGBUR negative 06/01/2006 1116   BILIRUBINUR negative 06/01/2006 1116   UROBILINOGEN 0.2 06/01/2006 1116   NITRITE negative 06/01/2006 1116     STUDIES: Dg Chest Port 1 View  Result Date: 06/02/2016 CLINICAL DATA:  Port-A-Cath placed by EXAM: PORTABLE CHEST 1 VIEW COMPARISON:  None. FINDINGS: Cardiomediastinal silhouette is unremarkable. Central mild vascular congestion without pulmonary edema. There is right IJ Port-A-Cath with tip in distal SVC. No pneumothorax. IMPRESSION: Right IJ Port-A-Cath in place.  No pneumothorax. Electronically Signed   By: Lahoma Crocker M.D.   On: 06/02/2016 11:16   Dg Fluoro Guide Cv Line-no Report  Result Date: 06/02/2016 Fluoroscopy was utilized by the requesting physician.  No radiographic interpretation.   Mm Clip Placement Left  Result Date: 05/25/2016 CLINICAL DATA:  61 year old female -evaluate clip placement  following MR guided left breast biopsy and evaluate placement of new clip directly into the biopsy-proven left breast cancer. EXAM: DIAGNOSTIC LEFT MAMMOGRAM POST MRI BIOPSY COMPARISON:  Previous exam(s). FINDINGS: Mammographic images were obtained following MR guided biopsy of linear enhancement within the lateral far posterior left breast and placement of new clip directly within the biopsy-proven left breast cancer. The spiral shaped HydroMARK clip is identified far posteriorly on the lateral view corresponding  to the MR area of biopsy and is in satisfactory position. The coil shaped clip lies directly in the patient's biopsy-proven left breast cancer and is in satisfactory position. The previously placed ribbon shaped clip at the site of biopsy lies 2 cm lateral to the coil shaped clip. IMPRESSION: Satisfactory position of biopsy clip following MR guided left breast biopsy. Satisfactory position of coil shaped clip directly within the patient's biopsy-proven left breast cancer. Final Assessment: Post Procedure Mammograms for Marker Placement Electronically Signed   By: Margarette Canada M.D.   On: 05/25/2016 12:07   Mr Aundra Millet Breast Bx Johnella Moloney Dev 1st Lesion Image Bx Spec Mr Guide  Addendum Date: 05/27/2016   ADDENDUM REPORT: 05/27/2016 07:29 ADDENDUM: Pathology revealed high grade invasive mammary carcinoma in the left breast. This was found to be concordant by Dr. Hassan Rowan. Pathology results were discussed with the patient by telephone. The patient reported doing well after the biopsy with tenderness at the site. Post biopsy instructions and care were reviewed and questions were answered. The patient was encouraged to call [facility] for any additional concerns. The patient has a recent diagnosis of left breast cancer and should follow her outlined treatment plan. Pathology results reported by Susa Raring RN, BSN on 05/27/2016. Electronically Signed   By: Margarette Canada M.D.   On: 05/27/2016  07:29   Result Date: 05/27/2016 CLINICAL DATA:  61 year old female for tissue sampling of abnormal 1.2 cm linear enhancement within the lateral far posterior left breast adjacent to the pectoralis muscle. Recently diagnosed left breast carcinoma. EXAM: MRI GUIDED CORE NEEDLE BIOPSY OF THE LEFT BREAST TECHNIQUE: Multiplanar, multisequence MR imaging of the left breast was performed both before and after administration of intravenous contrast. CONTRAST:  65m MULTIHANCE GADOBENATE DIMEGLUMINE 529 MG/ML IV SOLN COMPARISON:  Previous exams. FINDINGS: I met with the patient, and we discussed the procedure of MRI guided biopsy, including risks, benefits, and alternatives. Specifically, we discussed the risks of infection, bleeding, tissue injury, clip migration, and inadequate sampling. Informed, written consent was given. The usual time out protocol was performed immediately prior to the procedure. Using sterile technique, 1% Lidocaine, MRI guidance, and a 9 gauge vacuum assisted device, biopsy was performed of the 1.2 cm area of linear enhancement within the lateral far posterior left breast adjacent to the pectoralis muscle using a lateral approach. At the conclusion of the procedure, a HydroMARK tissue marker clip was deployed into the biopsy cavity. Follow-up 2-view mammogram was performed and dictated separately. IMPRESSION: MRI guided biopsy of abnormal linear enhancement within the lateral far posterior left breast. No apparent complications. Pathology will be followed. Electronically Signed: By: JMargarette CanadaM.D. On: 05/25/2016 10:37   UK[place]Lt Plc Breast Loc Dev   1st Lesion  Inc UKoreaGuide  Result Date: 05/25/2016 CLINICAL DATA:  Placement of biopsy clip directly into the biopsy-proven left breast cancer prior to neoadjuvant therapy. Clip placed at time of biopsy exhibited lateral migration. EXAM: NEEDLE LOCALIZATION OF THE LEFT BREAST WITH ULTRASOUND GUIDANCE COMPARISON:  Previous exams. FINDINGS: I met with  the patient and we discussed the procedure of clip placement including benefits and alternatives. We discussed the high likelihood of a successful procedure. We discussed the risks of the procedure, including infection, bleeding, tissue injury, and further surgery. Informed, written consent was given. The usual time-out protocol was performed immediately prior to the procedure. Using ultrasound guidance, sterile technique and 1% lidocaine, a coil shaped clip was a directly  placed into the biopsy-proven cancer at the 2 o'clock position of the left breast 9 cm from the nipple. Follow-up mammograms performed and dictated in a separate report, but the coil shaped clip is in satisfactory position. IMPRESSION: Satisfactory coil shaped clip placement within the left breast cancer as discussed above. Electronically Signed   By: Margarette Canada M.D.   On: 05/25/2016 12:10    ELIGIBLE FOR AVAILABLE RESEARCH PROTOCOL: None to candidate for Prevent  ASSESSMENT: 61 y.o. [place] woman status post left breast upper inner quadrant and left axillary lymph node biopsy every 16 2018, both showing invasive ductal carcinoma, grade 3, clinically T1c N1a, or stage IIB, estrogen receptor positive, progesterone receptor and HER-2 negative, with an MIB-1 of 40%  (a) MRI biopsy 05/25/2016 of a second area of concern in the left breast also shows invasive ductal carcinoma  (1) neoadjuvant anastrozole started 05/11/2016, held at the start of chemotherapy  (2) Mammaprint results shows the tumor to be luminal B, high risk, indicating a need for chemotherapy.  (3) chemotherapy will consist of doxorubicin and cyclophosphamide in dose dense fashion 4 starting 06/09/2016, followed by weekly paclitaxel 12  (4) breast conserving surgery with targeted axillary dissection to follow  (5) adjuvant radiation as appropriate  (6) adjuvant anti-estrogens to be continued a minimum of 5 years    PLAN: Chanese tolerated her first cycle of  chemotherapy remarkably well and at this point she is very pleased with the way things are going. She was very anxious but now she is very relieved. It is remarkable that she did not have significant aches and pains from her Neulasta, since that is frequently what most bothers patient's these days using this particular chemotherapy regimen  Even though she is only mildly neutropenic, given the small gluteal erosion I am putting her on Keflex 500 mg twice a day for 5 days. She will let us know she has any side effects from those antibiotics.  She will return in 1 week for cycle 2. She will see my partner Dr.Feng that day since I will be out of town and she will be able to troubleshoot any other issues that may have developed by then. Otherwise the patient will see me a week later to assess her tolerance of cycle 2  Zyniah knows to call for any other problems that may develop before her next visit.    Chauncey Cruel, MD   06/17/2016 8:53 AM Medical Oncology and Hematology [facility] [address] [place], [place] [address] Tel. [phone]    Fax. [phone]

## 2016-06-20 ENCOUNTER — Other Ambulatory Visit: Payer: Self-pay | Admitting: *Deleted

## 2016-06-23 ENCOUNTER — Encounter: Payer: Self-pay | Admitting: Hematology

## 2016-06-23 ENCOUNTER — Encounter: Payer: Self-pay | Admitting: *Deleted

## 2016-06-23 ENCOUNTER — Ambulatory Visit (HOSPITAL_BASED_OUTPATIENT_CLINIC_OR_DEPARTMENT_OTHER): Payer: BLUE CROSS/BLUE SHIELD | Admitting: Hematology

## 2016-06-23 ENCOUNTER — Other Ambulatory Visit (HOSPITAL_BASED_OUTPATIENT_CLINIC_OR_DEPARTMENT_OTHER): Payer: BLUE CROSS/BLUE SHIELD

## 2016-06-23 ENCOUNTER — Ambulatory Visit (HOSPITAL_BASED_OUTPATIENT_CLINIC_OR_DEPARTMENT_OTHER): Payer: BLUE CROSS/BLUE SHIELD

## 2016-06-23 VITALS — BP 175/89 | HR 96 | Temp 98.6°F | Resp 18 | Ht 63.0 in | Wt 193.0 lb

## 2016-06-23 DIAGNOSIS — C50412 Malignant neoplasm of upper-outer quadrant of left female breast: Secondary | ICD-10-CM

## 2016-06-23 DIAGNOSIS — Z170 Estrogen receptor positive status [ER+]: Secondary | ICD-10-CM

## 2016-06-23 DIAGNOSIS — C773 Secondary and unspecified malignant neoplasm of axilla and upper limb lymph nodes: Secondary | ICD-10-CM

## 2016-06-23 DIAGNOSIS — Z5189 Encounter for other specified aftercare: Secondary | ICD-10-CM

## 2016-06-23 DIAGNOSIS — Z5111 Encounter for antineoplastic chemotherapy: Secondary | ICD-10-CM

## 2016-06-23 LAB — COMPREHENSIVE METABOLIC PANEL
ALT: 21 U/L (ref 0–55)
AST: 14 U/L (ref 5–34)
Albumin: 3.7 g/dL (ref 3.5–5.0)
Alkaline Phosphatase: 83 U/L (ref 40–150)
Anion Gap: 9 mEq/L (ref 3–11)
BUN: 11.9 mg/dL (ref 7.0–26.0)
CHLORIDE: 108 meq/L (ref 98–109)
CO2: 26 mEq/L (ref 22–29)
CREATININE: 0.8 mg/dL (ref 0.6–1.1)
Calcium: 9.1 mg/dL (ref 8.4–10.4)
EGFR: >90 mL/min/{1.73_m2} (ref >90)
GLUCOSE: 137 mg/dL (ref 70–140)
POTASSIUM: 4.1 meq/L (ref 3.5–5.1)
SODIUM: 143 meq/L (ref 136–145)
Total Bilirubin: 0.31 mg/dL (ref 0.20–1.20)
Total Protein: 6.4 g/dL (ref 6.4–8.3)

## 2016-06-23 LAB — CBC WITH DIFFERENTIAL/PLATELET
BASO%: 0.4 % (ref 0.0–2.0)
Basophils Absolute: 0 10*3/uL (ref 0.0–0.1)
EOS ABS: 0 10*3/uL (ref 0.0–0.5)
EOS%: 0.2 % (ref 0.0–7.0)
HCT: 38 % (ref 34.8–46.6)
HGB: 12.3 g/dL (ref 11.6–15.9)
LYMPH%: 24.8 % (ref 14.0–49.7)
MCH: 25.5 pg (ref 25.1–34.0)
MCHC: 32.4 g/dL (ref 31.5–36.0)
MCV: 78.8 fL — AB (ref 79.5–101.0)
MONO#: 0.6 10*3/uL (ref 0.1–0.9)
MONO%: 6.1 % (ref 0.0–14.0)
NEUT#: 6.2 10*3/uL (ref 1.5–6.5)
NEUT%: 68.5 % (ref 38.4–76.8)
PLATELETS: 174 10*3/uL (ref 145–400)
RBC: 4.82 10*6/uL (ref 3.70–5.45)
RDW: 15.3 % — ABNORMAL HIGH (ref 11.2–14.5)
WBC: 9.1 10*3/uL (ref 3.9–10.3)
lymph#: 2.3 10*3/uL (ref 0.9–3.3)

## 2016-06-23 MED ORDER — PALONOSETRON HCL INJECTION 0.25 MG/5ML
INTRAVENOUS | Status: AC
  Filled 2016-06-23: qty 5

## 2016-06-23 MED ORDER — SODIUM CHLORIDE 0.9 % IV SOLN
Freq: Once | INTRAVENOUS | Status: AC
  Administered 2016-06-23: 11:00:00 via INTRAVENOUS
  Filled 2016-06-23: qty 5

## 2016-06-23 MED ORDER — SODIUM CHLORIDE 0.9 % IV SOLN
600.0000 mg/m2 | Freq: Once | INTRAVENOUS | Status: AC
  Administered 2016-06-23: 1200 mg via INTRAVENOUS
  Filled 2016-06-23: qty 60

## 2016-06-23 MED ORDER — HEPARIN SOD (PORK) LOCK FLUSH 100 UNIT/ML IV SOLN
500.0000 [IU] | Freq: Once | INTRAVENOUS | Status: AC | PRN
  Administered 2016-06-23: 500 [IU]
  Filled 2016-06-23: qty 5

## 2016-06-23 MED ORDER — SODIUM CHLORIDE 0.9% FLUSH
10.0000 mL | INTRAVENOUS | Status: DC | PRN
  Administered 2016-06-23: 10 mL
  Filled 2016-06-23: qty 10

## 2016-06-23 MED ORDER — SODIUM CHLORIDE 0.9 % IV SOLN
Freq: Once | INTRAVENOUS | Status: AC
  Administered 2016-06-23: 11:00:00 via INTRAVENOUS

## 2016-06-23 MED ORDER — DOXORUBICIN HCL CHEMO IV INJECTION 2 MG/ML
60.0000 mg/m2 | Freq: Once | INTRAVENOUS | Status: AC
  Administered 2016-06-23: 120 mg via INTRAVENOUS
  Filled 2016-06-23: qty 60

## 2016-06-23 MED ORDER — PEGFILGRASTIM 6 MG/0.6ML ~~LOC~~ PSKT
6.0000 mg | PREFILLED_SYRINGE | Freq: Once | SUBCUTANEOUS | Status: AC
  Administered 2016-06-23: 6 mg via SUBCUTANEOUS
  Filled 2016-06-23: qty 0.6

## 2016-06-23 MED ORDER — PALONOSETRON HCL INJECTION 0.25 MG/5ML
0.2500 mg | Freq: Once | INTRAVENOUS | Status: AC
  Administered 2016-06-23: 0.25 mg via INTRAVENOUS

## 2016-06-23 NOTE — Progress Notes (Signed)
1223 pt started experiencing nasal burning during Cytoxan infusion, per Dr Burr Medico ok to reduce rate of infusion to 422ml/hr.   Pt stated that the nasal burning had lessened. Notified pharmacy of reduced rate.

## 2016-06-23 NOTE — Patient Instructions (Signed)
[facility] Discharge Instructions for Patients Receiving Chemotherapy  Today you received the following chemotherapy agents Adriamycin/Cytoxan  To help prevent nausea and vomiting after your treatment, we encourage you to take your nausea medication    If you develop nausea and vomiting that is not controlled by your nausea medication, call the clinic.   BELOW ARE SYMPTOMS THAT SHOULD BE REPORTED IMMEDIATELY:  *FEVER GREATER THAN 100.5 F  *CHILLS WITH OR WITHOUT FEVER  NAUSEA AND VOMITING THAT IS NOT CONTROLLED WITH YOUR NAUSEA MEDICATION  *UNUSUAL SHORTNESS OF BREATH  *UNUSUAL BRUISING OR BLEEDING  TENDERNESS IN MOUTH AND THROAT WITH OR WITHOUT PRESENCE OF ULCERS  *URINARY PROBLEMS  *BOWEL PROBLEMS  UNUSUAL RASH Items with * indicate a potential emergency and should be followed up as soon as possible.  Feel free to call the clinic you have any questions or concerns. The clinic phone number is [phone].  Please show the [facility] at check-in to the Emergency Department and triage nurse.  Cyclophosphamide injection What is this medicine? CYCLOPHOSPHAMIDE (sye kloe FOSS fa mide) is a chemotherapy drug. It slows the growth of cancer cells. This medicine is used to treat many types of cancer like lymphoma, myeloma, leukemia, breast cancer, and ovarian cancer, to name a few. This medicine may be used for other purposes; ask your health care provider or pharmacist if you have questions. COMMON BRAND NAME(S): Cytoxan, Neosar What should I tell my health care provider before I take this medicine? They need to know if you have any of these conditions: -blood disorders -history of other chemotherapy -infection -kidney disease -liver disease -recent or ongoing radiation therapy -tumors in the bone marrow -an unusual or allergic reaction to cyclophosphamide, other chemotherapy, other medicines, foods, dyes, or preservatives -pregnant or trying to get  pregnant -breast-feeding How should I use this medicine? This drug is usually given as an injection into a vein or muscle or by infusion into a vein. It is administered in a hospital or clinic by a specially trained health care professional. Talk to your pediatrician regarding the use of this medicine in children. Special care may be needed. Overdosage: If you think you have taken too much of this medicine contact a poison control center or emergency room at once. NOTE: This medicine is only for you. Do not share this medicine with others. What if I miss a dose? It is important not to miss your dose. Call your doctor or health care professional if you are unable to keep an appointment. What may interact with this medicine? This medicine may interact with the following medications: -amiodarone -amphotericin B -azathioprine -certain antiviral medicines for HIV or AIDS such as protease inhibitors (e.g., indinavir, ritonavir) and zidovudine -certain blood pressure medications such as benazepril, captopril, enalapril, fosinopril, lisinopril, moexipril, monopril, perindopril, quinapril, ramipril, trandolapril -certain cancer medications such as anthracyclines (e.g., daunorubicin, doxorubicin), busulfan, cytarabine, paclitaxel, pentostatin, tamoxifen, trastuzumab -certain diuretics such as chlorothiazide, chlorthalidone, hydrochlorothiazide, indapamide, metolazone -certain medicines that treat or prevent blood clots like warfarin -certain muscle relaxants such as succinylcholine -cyclosporine -etanercept -indomethacin -medicines to increase blood counts like filgrastim, pegfilgrastim, sargramostim -medicines used as general anesthesia -metronidazole -natalizumab This list may not describe all possible interactions. Give your health care provider a list of all the medicines, herbs, non-prescription drugs, or dietary supplements you use. Also tell them if you smoke, drink alcohol, or use illegal  drugs. Some items may interact with your medicine. What should I watch for while using this  medicine? Visit your doctor for checks on your progress. This drug may make you feel generally unwell. This is not uncommon, as chemotherapy can affect healthy cells as well as cancer cells. Report any side effects. Continue your course of treatment even though you feel ill unless your doctor tells you to stop. Drink water or other fluids as directed. Urinate often, even at night. In some cases, you may be given additional medicines to help with side effects. Follow all directions for their use. Call your doctor or health care professional for advice if you get a fever, chills or sore throat, or other symptoms of a cold or flu. Do not treat yourself. This drug decreases your body's ability to fight infections. Try to avoid being around people who are sick. This medicine may increase your risk to bruise or bleed. Call your doctor or health care professional if you notice any unusual bleeding. Be careful brushing and flossing your teeth or using a toothpick because you may get an infection or bleed more easily. If you have any dental work done, tell your dentist you are receiving this medicine. You may get drowsy or dizzy. Do not drive, use machinery, or do anything that needs mental alertness until you know how this medicine affects you. Do not become pregnant while taking this medicine or for 1 year after stopping it. Women should inform their doctor if they wish to become pregnant or think they might be pregnant. Men should not father a child while taking this medicine and for 4 months after stopping it. There is a potential for serious side effects to an unborn child. Talk to your health care professional or pharmacist for more information. Do not breast-feed an infant while taking this medicine. This medicine may interfere with the ability to have a child. This medicine has caused ovarian failure in some women.  This medicine has caused reduced sperm counts in some men. You should talk with your doctor or health care professional if you are concerned about your fertility. If you are going to have surgery, tell your doctor or health care professional that you have taken this medicine. What side effects may I notice from receiving this medicine? Side effects that you should report to your doctor or health care professional as soon as possible: -allergic reactions like skin rash, itching or hives, swelling of the face, lips, or tongue -low blood counts - this medicine may decrease the number of white blood cells, red blood cells and platelets. You may be at increased risk for infections and bleeding. -signs of infection - fever or chills, cough, sore throat, pain or difficulty passing urine -signs of decreased platelets or bleeding - bruising, pinpoint red spots on the skin, black, tarry stools, blood in the urine -signs of decreased red blood cells - unusually weak or tired, fainting spells, lightheadedness -breathing problems -dark urine -dizziness -palpitations -swelling of the ankles, feet, hands -trouble passing urine or change in the amount of urine -weight gain -yellowing of the eyes or skin Side effects that usually do not require medical attention (report to your doctor or health care professional if they continue or are bothersome): -changes in nail or skin color -hair loss -missed menstrual periods -mouth sores -nausea, vomiting This list may not describe all possible side effects. Call your doctor for medical advice about side effects. You may report side effects to FDA at 1-800-FDA-1088. Where should I keep my medicine? This drug is given in a hospital or clinic and will  not be stored at home. NOTE: This sheet is a summary. It may not cover all possible information. If you have questions about this medicine, talk to your doctor, pharmacist, or health care provider.  2018 [employer]  Standard (2012-01-13 16:22:58)   Doxorubicin injection What is this medicine? DOXORUBICIN (dox oh ROO bi sin) is a chemotherapy drug. It is used to treat many kinds of cancer like leukemia, lymphoma, neuroblastoma, sarcoma, and Wilms' tumor. It is also used to treat bladder cancer, breast cancer, lung cancer, ovarian cancer, stomach cancer, and thyroid cancer. This medicine may be used for other purposes; ask your health care provider or pharmacist if you have questions. COMMON BRAND NAME(S): Adriamycin, Adriamycin PFS, Adriamycin RDF, Rubex What should I tell my health care provider before I take this medicine? They need to know if you have any of these conditions: -heart disease -history of low blood counts caused by a medicine -liver disease -recent or ongoing radiation therapy -an unusual or allergic reaction to doxorubicin, other chemotherapy agents, other medicines, foods, dyes, or preservatives -pregnant or trying to get pregnant -breast-feeding How should I use this medicine? This drug is given as an infusion into a vein. It is administered in a hospital or clinic by a specially trained health care professional. If you have pain, swelling, burning or any unusual feeling around the site of your injection, tell your health care professional right away. Talk to your pediatrician regarding the use of this medicine in children. Special care may be needed. Overdosage: If you think you have taken too much of this medicine contact a poison control center or emergency room at once. NOTE: This medicine is only for you. Do not share this medicine with others. What if I miss a dose? It is important not to miss your dose. Call your doctor or health care professional if you are unable to keep an appointment. What may interact with this medicine? This medicine may interact with the following medications: -6-mercaptopurine -paclitaxel -phenytoin -St. John's Wort -trastuzumab -verapamil This  list may not describe all possible interactions. Give your health care provider a list of all the medicines, herbs, non-prescription drugs, or dietary supplements you use. Also tell them if you smoke, drink alcohol, or use illegal drugs. Some items may interact with your medicine. What should I watch for while using this medicine? This drug may make you feel generally unwell. This is not uncommon, as chemotherapy can affect healthy cells as well as cancer cells. Report any side effects. Continue your course of treatment even though you feel ill unless your doctor tells you to stop. There is a maximum amount of this medicine you should receive throughout your life. The amount depends on the medical condition being treated and your overall health. Your doctor will watch how much of this medicine you receive in your lifetime. Tell your doctor if you have taken this medicine before. You may need blood work done while you are taking this medicine. Your urine may turn red for a few days after your dose. This is not blood. If your urine is dark or brown, call your doctor. In some cases, you may be given additional medicines to help with side effects. Follow all directions for their use. Call your doctor or health care professional for advice if you get a fever, chills or sore throat, or other symptoms of a cold or flu. Do not treat yourself. This drug decreases your body's ability to fight infections. Try to avoid being around people  who are sick. This medicine may increase your risk to bruise or bleed. Call your doctor or health care professional if you notice any unusual bleeding. Talk to your doctor about your risk of cancer. You may be more at risk for certain types of cancers if you take this medicine. Do not become pregnant while taking this medicine or for 6 months after stopping it. Women should inform their doctor if they wish to become pregnant or think they might be pregnant. Men should not father a  child while taking this medicine and for 6 months after stopping it. There is a potential for serious side effects to an unborn child. Talk to your health care professional or pharmacist for more information. Do not breast-feed an infant while taking this medicine. This medicine has caused ovarian failure in some women and reduced sperm counts in some men This medicine may interfere with the ability to have a child. Talk with your doctor or health care professional if you are concerned about your fertility. What side effects may I notice from receiving this medicine? Side effects that you should report to your doctor or health care professional as soon as possible: -allergic reactions like skin rash, itching or hives, swelling of the face, lips, or tongue -breathing problems -chest pain -fast or irregular heartbeat -low blood counts - this medicine may decrease the number of white blood cells, red blood cells and platelets. You may be at increased risk for infections and bleeding. -pain, redness, or irritation at site where injected -signs of infection - fever or chills, cough, sore throat, pain or difficulty passing urine -signs of decreased platelets or bleeding - bruising, pinpoint red spots on the skin, black, tarry stools, blood in the urine -swelling of the ankles, feet, hands -tiredness -weakness Side effects that usually do not require medical attention (report to your doctor or health care professional if they continue or are bothersome): -diarrhea -hair loss -mouth sores -nail discoloration or damage -nausea -red colored urine -vomiting This list may not describe all possible side effects. Call your doctor for medical advice about side effects. You may report side effects to FDA at 1-800-FDA-1088. Where should I keep my medicine? This drug is given in a hospital or clinic and will not be stored at home. NOTE: This sheet is a summary. It may not cover all possible information. If  you have questions about this medicine, talk to your doctor, pharmacist, or health care provider.  2018 [employer] Standard (2015-04-27 11:28:51)

## 2016-06-24 ENCOUNTER — Telehealth: Payer: Self-pay | Admitting: Medical Oncology

## 2016-06-24 NOTE — Telephone Encounter (Signed)
Eft message for pt and todl her she does not come tomorrow for an injection appt

## 2016-06-25 ENCOUNTER — Encounter: Payer: Self-pay | Admitting: Hematology

## 2016-06-25 ENCOUNTER — Ambulatory Visit: Payer: BLUE CROSS/BLUE SHIELD

## 2016-06-27 ENCOUNTER — Telehealth: Payer: Self-pay | Admitting: Hematology

## 2016-06-27 NOTE — Telephone Encounter (Signed)
No LOS per 06/23/16 visit.

## 2016-06-30 ENCOUNTER — Ambulatory Visit (HOSPITAL_BASED_OUTPATIENT_CLINIC_OR_DEPARTMENT_OTHER): Payer: BLUE CROSS/BLUE SHIELD | Admitting: Oncology

## 2016-06-30 ENCOUNTER — Other Ambulatory Visit (HOSPITAL_BASED_OUTPATIENT_CLINIC_OR_DEPARTMENT_OTHER): Payer: BLUE CROSS/BLUE SHIELD

## 2016-06-30 VITALS — BP 140/90 | HR 86 | Temp 98.2°F | Resp 20 | Wt 189.9 lb

## 2016-06-30 DIAGNOSIS — C50212 Malignant neoplasm of upper-inner quadrant of left female breast: Secondary | ICD-10-CM

## 2016-06-30 DIAGNOSIS — Z170 Estrogen receptor positive status [ER+]: Secondary | ICD-10-CM | POA: Diagnosis not present

## 2016-06-30 DIAGNOSIS — C50412 Malignant neoplasm of upper-outer quadrant of left female breast: Secondary | ICD-10-CM

## 2016-06-30 DIAGNOSIS — C773 Secondary and unspecified malignant neoplasm of axilla and upper limb lymph nodes: Secondary | ICD-10-CM | POA: Diagnosis not present

## 2016-06-30 DIAGNOSIS — R530 Neoplastic (malignant) related fatigue: Secondary | ICD-10-CM | POA: Diagnosis not present

## 2016-06-30 LAB — CBC WITH DIFFERENTIAL/PLATELET
BASO%: 1.1 % (ref 0.0–2.0)
Basophils Absolute: 0 10*3/uL (ref 0.0–0.1)
EOS%: 0.6 % (ref 0.0–7.0)
Eosinophils Absolute: 0 10*3/uL (ref 0.0–0.5)
HEMATOCRIT: 37.2 % (ref 34.8–46.6)
HGB: 12.1 g/dL (ref 11.6–15.9)
LYMPH#: 1.5 10*3/uL (ref 0.9–3.3)
LYMPH%: 46.5 % (ref 14.0–49.7)
MCH: 25.6 pg (ref 25.1–34.0)
MCHC: 32.4 g/dL (ref 31.5–36.0)
MCV: 78.9 fL — ABNORMAL LOW (ref 79.5–101.0)
MONO#: 0.2 10*3/uL (ref 0.1–0.9)
MONO%: 5.7 % (ref 0.0–14.0)
NEUT%: 46.1 % (ref 38.4–76.8)
NEUTROS ABS: 1.5 10*3/uL (ref 1.5–6.5)
Platelets: 145 10*3/uL (ref 145–400)
RBC: 4.72 10*6/uL (ref 3.70–5.45)
RDW: 16.1 % — ABNORMAL HIGH (ref 11.2–14.5)
WBC: 3.1 10*3/uL — AB (ref 3.9–10.3)

## 2016-06-30 LAB — COMPREHENSIVE METABOLIC PANEL
ALK PHOS: 105 U/L (ref 40–150)
ALT: 30 U/L (ref 0–55)
AST: 12 U/L (ref 5–34)
Albumin: 3.5 g/dL (ref 3.5–5.0)
Anion Gap: 8 mEq/L (ref 3–11)
BUN: 9.9 mg/dL (ref 7.0–26.0)
CALCIUM: 9.1 mg/dL (ref 8.4–10.4)
CO2: 28 mEq/L (ref 22–29)
Chloride: 105 mEq/L (ref 98–109)
Creatinine: 0.8 mg/dL (ref 0.6–1.1)
GLUCOSE: 107 mg/dL (ref 70–140)
POTASSIUM: 4.1 meq/L (ref 3.5–5.1)
SODIUM: 141 meq/L (ref 136–145)
Total Bilirubin: 0.49 mg/dL (ref 0.20–1.20)
Total Protein: 6.2 g/dL — ABNORMAL LOW (ref 6.4–8.3)

## 2016-06-30 NOTE — Progress Notes (Signed)
[place]  Telephone:([phone] Fax:([phone]     ID: Tanya [name] DOB: 1955-12-27  MR#: [id number]  IDC#:[id number]  Patient Care Team: Everrett Coombe, MD as PCP - General Rolm Bookbinder, MD as Consulting Physician (General Surgery) Chauncey Cruel, MD as Consulting Physician (Oncology) Kyung Rudd, MD as Consulting Physician (Radiation Oncology) Chauncey Cruel, MD OTHER MD:  CHIEF COMPLAINT: Estrogen receptor positive breast cancer  CURRENT TREATMENT: Neoadjuvant chemotherapy   BREAST CANCER HISTORY: From the original intake note:  Tanya [name] had routine bilateral screening mammography at the [facility] 04/19/2016. This showed a possible area of the symmetry in the left breast and a prominent left axillary lymph nodes. On 04/28/2016 she underwent diagnostic left mammography with tomography and left breast ultrasonography. This showed the breast density to be category B. There was an obscured mass in the upper outer left breast and an enlarged left axillary lymph node. On exam there was focal thickening at the 2:00 position of the left breast 9 cm from the nipple. Targeted ultrasonography confirmed a 1.8 cm hypoechoic mass with a second adjacent 1.1 cm mass both at the 2:00 position of the left breast 9 cm from the nipple. The conglomerate area measured 3.5 cm. There was also an enlarged left axillary lymph node with loss of fatty hilum measuring 1.8 cm.  On family 16 2018 the patient underwent biopsy of the larger left breast mass in question and the suspicious left axillary lymph node. 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  The patient's subsequent history is as detailed below  INTERVAL HISTORY: Tanya [name] returns today for  follow-up of her estrogen receptor positive breast cancer accompanied by her husband. Today is day 8 cycle 2 of 4 planned cycles of cyclophosphamide and doxorubicin which she receives every 14 days.  REVIEW OF SYSTEMS: . On Saturday she notices her face was swelling. She had tried to go off the dexamethasone. She was told to go back on it and that took care of the problem. She has a little bit of a runny nose. She has completed antibiotics for her buttocks sore and that has pretty much resolved. She had pain in both feet but not anymore. He had dry mouth. She has frequent urination but no dysuria. She denies hematuria. Bowel movements are fine. Her mouth is a little sore but she does not have mouth sores as such. She is losing her hair. She has the scalp discomfort that accompanies that  A detailed review of systems today was otherwise stable.  PAST MEDICAL HISTORY: Past Medical History:  Diagnosis Date  . ANXIETY   . Dyspnea on exertion   . HYPERLIPIDEMIA   . HYPERTENSION, BENIGN ESSENTIAL   . Malignant neoplasm of upper-outer quadrant of left breast in female, estrogen receptor positive ([place]) 05/04/2016  . OBESITY     PAST SURGICAL HISTORY: Past Surgical History:  Procedure Laterality Date  . CESAREAN SECTION    . PORTACATH PLACEMENT Right 06/02/2016   Procedure: INSERTION PORT-A-CATH WITH ULTRASOUND GUIDANCE;  Surgeon: Rolm Bookbinder, MD;  Location: [facility];  Service: General;  Laterality: Right;  . TUBAL LIGATION      FAMILY HISTORY Family History  Problem Relation Age of Onset  . Breast cancer Mother 39  The patient's father had a history of prostate cancer.  He died at age 71. The patient's mother died at age 66 from unclear causes. She had a history of breast cancer diagnosed in her 34s. The patient had 4 brothers, 2 sisters. There is no other history of breast or ovarian cancer in the family to the patient's knowledge  GYNECOLOGIC HISTORY:  No LMP recorded.  Patient is postmenopausal. Menarche age 57, first live birth age 29, the patient is [name] P4. She stopped having periods approximately 2006. She did not use hormone replacement. She used oral contraceptives briefly and remotely, with no complications.  SOCIAL HISTORY:  The patient is originally from [name]. 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 The patient's husband Tanya [name] was also a professor at [employer], where he taught agriculture oh research. Son Tanya [name] works in Land, son Tanya [name] works for [employer], daughter Tanya [name] is a Education officer, museum, and daughter Tanya [name] is also a Education officer, museum, all living in [place]. The patient has 3 grandchildren. She attends [employer]    ADVANCED DIRECTIVES: Not in place   HEALTH MAINTENANCE: Social History  Substance Use Topics  . Smoking status: Never Smoker  . Smokeless tobacco: Never Used  . Alcohol use Yes     Comment: rarely     Colonoscopy:Never  PAP: November 2017  Bone density: Never   Allergies  Allergen Reactions  . Camoquin [Amodiaquine] Itching    Current Outpatient Prescriptions  Medication Sig Dispense Refill  . dexamethasone (DECADRON) 4 MG tablet Take 2 tablets by mouth once a day on the day after chemotherapy and then take 2 tablets two times a day for 2 days. Take with food. 30 tablet 1  . lidocaine-prilocaine (EMLA) cream Apply to affected area once 30 g 3  . loratadine (CLARITIN) 10 MG tablet Take 1 tablet (10 mg total) by mouth daily. (Patient taking differently: Take 10 mg by mouth daily. Takes with neulasta) 30 tablet 3  . LORazepam (ATIVAN) 0.5 MG tablet Take 1 tablet (0.5 mg total) by mouth at bedtime as needed (Nausea or vomiting). 30 tablet 0  . losartan (COZAAR) 50 MG tablet Take 1 tablet (50 mg total) by mouth daily. 90 tablet 2  . pravastatin (PRAVACHOL) 40 MG tablet Take 1 tablet (40 mg total) by mouth at bedtime. 90 tablet 2  . prochlorperazine (COMPAZINE) 10 MG tablet Take 1 tablet (10  mg total) by mouth every 6 (six) hours as needed (Nausea or vomiting). 30 tablet 1   No current facility-administered medications for this visit.     OBJECTIVE: Middle-aged African-American woman In no acute distress  Vitals:   06/30/16 1144  BP: 140/90  Pulse: 86  Resp: 20  Temp: 98.2 F (36.8 C)     Body mass index is 33.64 kg/m.    ECOG FS:1 - Symptomatic but completely ambulatory  Sclerae unicteric, pupils round and equal Oropharynx clear and moist No cervical or supraclavicular adenopathy Lungs no rales or rhonchi Heart regular rate and rhythm Abd soft, nontender, positive bowel sounds MSK no focal spinal tenderness, no upper extremity lymphedema Neuro: nonfocal, well oriented, appropriate affect Breasts: Deferred   LAB RESULTS:  CMP     Component Value Date/Time   NA 143 06/23/2016 0908   K 4.1 06/23/2016 0908   CL 102 01/26/2016 1629   CO2 26 06/23/2016 0908   GLUCOSE 137 06/23/2016 0908   BUN 11.9 06/23/2016 0908   CREATININE 0.8 06/23/2016 0908   CALCIUM 9.1 06/23/2016 0908   PROT 6.4 06/23/2016 0908  ALBUMIN 3.7 06/23/2016 0908   AST 14 06/23/2016 0908   ALT 21 06/23/2016 0908   ALKPHOS 83 06/23/2016 0908   BILITOT 0.31 06/23/2016 0908    INo results found for: SPEP, UPEP  CBC    Component Value Date/Time   WBC 3.1 (L) 06/30/2016 1125   WBC 7.6 01/23/2014 1431   RBC 4.72 06/30/2016 1125   RBC 5.41 (H) 01/23/2014 1431   HGB 12.1 06/30/2016 1125   HCT 37.2 06/30/2016 1125   PLT 145 06/30/2016 1125   MCV 78.9 (L) 06/30/2016 1125   MCH 25.6 06/30/2016 1125   MCH 25.0 (L) 01/23/2014 1431   MCHC 32.4 06/30/2016 1125   MCHC 33.8 01/23/2014 1431   RDW 16.1 (H) 06/30/2016 1125   LYMPHSABS 1.5 06/30/2016 1125   MONOABS 0.2 06/30/2016 1125   EOSABS 0.0 06/30/2016 1125   BASOSABS 0.0 06/30/2016 1125        Chemistry      Component Value Date/Time   NA 143 06/23/2016 0908   K 4.1 06/23/2016 0908   CL 102 01/26/2016 1629   CO2 26  06/23/2016 0908   BUN 11.9 06/23/2016 0908   CREATININE 0.8 06/23/2016 0908      Component Value Date/Time   CALCIUM 9.1 06/23/2016 0908   ALKPHOS 83 06/23/2016 0908   AST 14 06/23/2016 0908   ALT 21 06/23/2016 0908   BILITOT 0.31 06/23/2016 0908       No results found for: LABCA2  No components found for: LABCA125  No results for input(s): INR in the last 168 hours.  Urinalysis    Component Value Date/Time   COLORURINE yellow 06/01/2006 1116   APPEARANCEUR Clear 06/01/2006 1116   LABSPEC 1.010 06/01/2006 1116   PHURINE 6.5 06/01/2006 1116   HGBUR negative 06/01/2006 1116   BILIRUBINUR negative 06/01/2006 1116   UROBILINOGEN 0.2 06/01/2006 1116   NITRITE negative 06/01/2006 1116     STUDIES: Dg Chest Port 1 View  Result Date: 06/02/2016 CLINICAL DATA:  Port-A-Cath placed by EXAM: PORTABLE CHEST 1 VIEW COMPARISON:  None. FINDINGS: Cardiomediastinal silhouette is unremarkable. Central mild vascular congestion without pulmonary edema. There is right IJ Port-A-Cath with tip in distal SVC. No pneumothorax. IMPRESSION: Right IJ Port-A-Cath in place.  No pneumothorax. Electronically Signed   By: Lahoma Crocker M.D.   On: 06/02/2016 11:16   Dg Fluoro Guide Cv Line-no Report  Result Date: 06/02/2016 Fluoroscopy was utilized by the requesting physician.  No radiographic interpretation.    ELIGIBLE FOR AVAILABLE RESEARCH PROTOCOL: None to candidate for Prevent  ASSESSMENT: 61 y.o. [place] woman status post left breast upper inner quadrant and left axillary lymph node biopsy every 16 2018, both showing invasive ductal carcinoma, grade 3, clinically T1c N1a, or stage IIB, estrogen receptor positive, progesterone receptor and HER-2 negative, with an MIB-1 of 40%  (a) MRI biopsy 05/25/2016 of a second area of concern in the left breast also shows invasive ductal carcinoma  (1) neoadjuvant anastrozole started 05/11/2016, held at the start of chemotherapy  (2) Mammaprint results  shows the tumor to be luminal B, high risk, indicating a need for chemotherapy.  (3) chemotherapy will consist of doxorubicin and cyclophosphamide in dose dense fashion 4 starting 06/09/2016, followed by weekly paclitaxel 12  (4) breast conserving surgery with targeted axillary dissection to follow  (5) adjuvant radiation as appropriate  (6) adjuvant anti-estrogens to be continued a minimum of 5 years    PLAN: [place] with did well with her second cycle of  chemotherapy, although she does have some new problems, chiefly the fatigue. We discussed the fact that the only thing that really works for this is activity. 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 If she can take a little walks in addition to her that that would be optimal.  The second thing she can do to help herself is hydrate herself better. We discussed that at length today.  She try to not take the dexamethasone but it turned out to be more of a problem so she has gone back on it more with good results.  She was worried that the OnPro might not have worked because it did not be right but it certainly did be she brought the cases and both show empty, which is  She will return for her third cycle of doxorubicin and cyclophosphamide next week. She knows to call for any problems that may develop before that visit.   Chauncey Cruel, MD   06/30/2016 11:51 AM Medical Oncology and Hematology [facility] [address] [place], [place] [address] Tel. [phone]    Fax. [phone]

## 2016-07-07 ENCOUNTER — Ambulatory Visit (HOSPITAL_BASED_OUTPATIENT_CLINIC_OR_DEPARTMENT_OTHER): Payer: BLUE CROSS/BLUE SHIELD | Admitting: Oncology

## 2016-07-07 ENCOUNTER — Encounter: Payer: Self-pay | Admitting: *Deleted

## 2016-07-07 ENCOUNTER — Other Ambulatory Visit (HOSPITAL_BASED_OUTPATIENT_CLINIC_OR_DEPARTMENT_OTHER): Payer: BLUE CROSS/BLUE SHIELD

## 2016-07-07 ENCOUNTER — Ambulatory Visit (HOSPITAL_BASED_OUTPATIENT_CLINIC_OR_DEPARTMENT_OTHER): Payer: BLUE CROSS/BLUE SHIELD

## 2016-07-07 VITALS — BP 156/90 | HR 93 | Temp 98.5°F | Resp 18 | Ht 63.0 in | Wt 190.3 lb

## 2016-07-07 DIAGNOSIS — C50412 Malignant neoplasm of upper-outer quadrant of left female breast: Secondary | ICD-10-CM | POA: Diagnosis not present

## 2016-07-07 DIAGNOSIS — Z170 Estrogen receptor positive status [ER+]: Secondary | ICD-10-CM

## 2016-07-07 DIAGNOSIS — R12 Heartburn: Secondary | ICD-10-CM

## 2016-07-07 DIAGNOSIS — F419 Anxiety disorder, unspecified: Secondary | ICD-10-CM | POA: Diagnosis not present

## 2016-07-07 DIAGNOSIS — Z5189 Encounter for other specified aftercare: Secondary | ICD-10-CM | POA: Diagnosis not present

## 2016-07-07 DIAGNOSIS — C773 Secondary and unspecified malignant neoplasm of axilla and upper limb lymph nodes: Secondary | ICD-10-CM

## 2016-07-07 DIAGNOSIS — R530 Neoplastic (malignant) related fatigue: Secondary | ICD-10-CM | POA: Diagnosis not present

## 2016-07-07 DIAGNOSIS — Z5111 Encounter for antineoplastic chemotherapy: Secondary | ICD-10-CM | POA: Diagnosis not present

## 2016-07-07 LAB — COMPREHENSIVE METABOLIC PANEL
ALT: 30 U/L (ref 0–55)
ANION GAP: 10 meq/L (ref 3–11)
AST: 16 U/L (ref 5–34)
Albumin: 3.7 g/dL (ref 3.5–5.0)
Alkaline Phosphatase: 88 U/L (ref 40–150)
BUN: 14.6 mg/dL (ref 7.0–26.0)
CALCIUM: 9 mg/dL (ref 8.4–10.4)
CHLORIDE: 106 meq/L (ref 98–109)
CO2: 25 mEq/L (ref 22–29)
Creatinine: 0.8 mg/dL (ref 0.6–1.1)
Glucose: 149 mg/dl — ABNORMAL HIGH (ref 70–140)
POTASSIUM: 4 meq/L (ref 3.5–5.1)
Sodium: 141 mEq/L (ref 136–145)
Total Bilirubin: 0.31 mg/dL (ref 0.20–1.20)
Total Protein: 6.3 g/dL — ABNORMAL LOW (ref 6.4–8.3)

## 2016-07-07 LAB — CBC WITH DIFFERENTIAL/PLATELET
BASO%: 0.8 % (ref 0.0–2.0)
Basophils Absolute: 0.1 10*3/uL (ref 0.0–0.1)
EOS ABS: 0 10*3/uL (ref 0.0–0.5)
EOS%: 0.2 % (ref 0.0–7.0)
HCT: 36 % (ref 34.8–46.6)
HEMOGLOBIN: 11.8 g/dL (ref 11.6–15.9)
LYMPH#: 1.7 10*3/uL (ref 0.9–3.3)
LYMPH%: 21 % (ref 14.0–49.7)
MCH: 25.9 pg (ref 25.1–34.0)
MCHC: 32.7 g/dL (ref 31.5–36.0)
MCV: 79.2 fL — AB (ref 79.5–101.0)
MONO#: 0.5 10*3/uL (ref 0.1–0.9)
MONO%: 6.8 % (ref 0.0–14.0)
NEUT%: 71.2 % (ref 38.4–76.8)
NEUTROS ABS: 5.8 10*3/uL (ref 1.5–6.5)
PLATELETS: 152 10*3/uL (ref 145–400)
RBC: 4.55 10*6/uL (ref 3.70–5.45)
RDW: 16.1 % — AB (ref 11.2–14.5)
WBC: 8.1 10*3/uL (ref 3.9–10.3)

## 2016-07-07 MED ORDER — OMEPRAZOLE 40 MG PO CPDR: 40.0000 mg | DELAYED_RELEASE_CAPSULE | Freq: Every day | ORAL | 3 refills | Status: DC

## 2016-07-07 MED ORDER — PALONOSETRON HCL INJECTION 0.25 MG/5ML
0.2500 mg | Freq: Once | INTRAVENOUS | Status: AC
  Administered 2016-07-07: 0.25 mg via INTRAVENOUS

## 2016-07-07 MED ORDER — PALONOSETRON HCL INJECTION 0.25 MG/5ML
INTRAVENOUS | Status: AC
  Filled 2016-07-07: qty 5

## 2016-07-07 MED ORDER — DOXORUBICIN HCL CHEMO IV INJECTION 2 MG/ML
60.0000 mg/m2 | Freq: Once | INTRAVENOUS | Status: AC
  Administered 2016-07-07: 120 mg via INTRAVENOUS
  Filled 2016-07-07: qty 60

## 2016-07-07 MED ORDER — SODIUM CHLORIDE 0.9 % IV SOLN
Freq: Once | INTRAVENOUS | Status: AC
  Administered 2016-07-07: 10:00:00 via INTRAVENOUS
  Filled 2016-07-07: qty 5

## 2016-07-07 MED ORDER — SODIUM CHLORIDE 0.9% FLUSH
10.0000 mL | INTRAVENOUS | Status: DC | PRN
  Administered 2016-07-07: 10 mL
  Filled 2016-07-07: qty 10

## 2016-07-07 MED ORDER — PEGFILGRASTIM 6 MG/0.6ML ~~LOC~~ PSKT
6.0000 mg | PREFILLED_SYRINGE | Freq: Once | SUBCUTANEOUS | Status: AC
  Administered 2016-07-07: 6 mg via SUBCUTANEOUS
  Filled 2016-07-07: qty 0.6

## 2016-07-07 MED ORDER — SODIUM CHLORIDE 0.9 % IV SOLN
Freq: Once | INTRAVENOUS | Status: AC
  Administered 2016-07-07: 10:00:00 via INTRAVENOUS

## 2016-07-07 MED ORDER — SODIUM CHLORIDE 0.9 % IV SOLN
600.0000 mg/m2 | Freq: Once | INTRAVENOUS | Status: AC
  Administered 2016-07-07: 1200 mg via INTRAVENOUS
  Filled 2016-07-07: qty 60

## 2016-07-07 MED ORDER — HEPARIN SOD (PORK) LOCK FLUSH 100 UNIT/ML IV SOLN
500.0000 [IU] | Freq: Once | INTRAVENOUS | Status: AC | PRN
  Administered 2016-07-07: 500 [IU]
  Filled 2016-07-07: qty 5

## 2016-07-07 NOTE — Patient Instructions (Signed)
[facility] Discharge Instructions for Patients Receiving Chemotherapy  Today you received the following chemotherapy agents:Adriamycin and Cytoxan   To help prevent nausea and vomiting after your treatment, we encourage you to take your nausea medication as directed.    If you develop nausea and vomiting that is not controlled by your nausea medication, call the clinic.   BELOW ARE SYMPTOMS THAT SHOULD BE REPORTED IMMEDIATELY:  *FEVER GREATER THAN 100.5 F  *CHILLS WITH OR WITHOUT FEVER  NAUSEA AND VOMITING THAT IS NOT CONTROLLED WITH YOUR NAUSEA MEDICATION  *UNUSUAL SHORTNESS OF BREATH  *UNUSUAL BRUISING OR BLEEDING  TENDERNESS IN MOUTH AND THROAT WITH OR WITHOUT PRESENCE OF ULCERS  *URINARY PROBLEMS  *BOWEL PROBLEMS  UNUSUAL RASH Items with * indicate a potential emergency and should be followed up as soon as possible.  Feel free to call the clinic you have any questions or concerns. The clinic phone number is [phone].  Please show the CHEMO ALERT CARD at check-in to the Emergency Department and triage nurse.   

## 2016-07-07 NOTE — Progress Notes (Signed)
[facility]  Telephone:(336) [phone] Fax:(336) [phone]     ID: Tanya [name] DOB: [birth year]  MR#: [id number]  RYS#:[id number]  Patient Care Team: Howard Pouch, MD as PCP - General Emelia Loron, MD as Consulting Physician (General Surgery) Lowella Dell, MD as Consulting Physician (Oncology) Dorothy Puffer, MD as Consulting Physician (Radiation Oncology) Lowella Dell, MD OTHER MD:  CHIEF COMPLAINT: Estrogen receptor positive breast cancer  CURRENT TREATMENT: Neoadjuvant chemotherapy   BREAST CANCER HISTORY: From the original intake note:  Tanya [name] had routine bilateral screening mammography at the [facility] 04/19/2016. This showed a possible area of the symmetry in the left breast and a prominent left axillary lymph nodes. On 04/28/2016 she underwent diagnostic left mammography with tomography and left breast ultrasonography. This showed the breast density to be category B. There was an obscured mass in the upper outer left breast and an enlarged left axillary lymph node. On exam there was focal thickening at the 2:00 position of the left breast 9 cm from the nipple. Targeted ultrasonography confirmed a 1.8 cm hypoechoic mass with a second adjacent 1.1 cm mass both at the 2:00 position of the left breast 9 cm from the nipple. The conglomerate area measured 3.5 cm. There was also an enlarged left axillary lymph node with loss of fatty hilum measuring 1.8 cm.  On family 39 2018 the patient underwent biopsy of the larger left breast mass in question and the suspicious left axillary lymph node. 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  The patient's subsequent history is as detailed below  INTERVAL HISTORY: Tanya [name] returns today for  follow-up of her breast cancer accompanied by her husband. Today is day 1 cycle 3 of 4 planned cycles of cyclophosphamide and doxorubicin given in dose dense fashion, to be followed by taxanes weekly 12  REVIEW OF SYSTEMS: Tanya [name] has pretty much lost her hair although interestingly the here in the middle part of her scalp to the back is still hanging on. She felt very fatigued after chemotherapy but then recovered died the eighth. She continues to work full-time, teaching Mondays, Wednesdays, and Fridays. Thankfully her last day of teaching will be Wednesday of this coming week. Aside from the fatigue she has had a mild dry cough, some heartburn, more gas than usual, and some anxiety. She finds it difficult to fall asleep area she did take lorazepam 1 and that was helpful. She is not exercising. A detailed review of systems today was otherwise stable.  PAST MEDICAL HISTORY: Past Medical History:  Diagnosis Date  . ANXIETY   . Dyspnea on exertion   . HYPERLIPIDEMIA   . HYPERTENSION, BENIGN ESSENTIAL   . Malignant neoplasm of upper-outer quadrant of left breast in female, 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  . OBESITY     PAST SURGICAL HISTORY: Past Surgical History:  Procedure Laterality Date  . CESAREAN SECTION    . PORTACATH PLACEMENT Right 06/02/2016   Procedure: INSERTION PORT-A-CATH WITH ULTRASOUND GUIDANCE;  Surgeon: Emelia Loron, MD;  Location: [facility];  Service: General;  Laterality: Right;  . TUBAL LIGATION      FAMILY HISTORY Family History  Problem Relation Age of Onset  . Breast cancer Mother 47  The patient's father had a history of prostate cancer. He died at age 5. The patient's  mother died at age 54 from unclear causes. She had a history of breast cancer diagnosed in her 28s. The patient had 4 brothers, 2 sisters. There is no other history of breast or ovarian cancer in the family to the patient's knowledge  GYNECOLOGIC HISTORY:  No LMP  recorded. Patient is postmenopausal. Menarche age 21, first live birth age 48, the patient is [place] P4. She stopped having periods approximately 2006. She did not use hormone replacement. She used oral contraceptives briefly and remotely, with no complications.  SOCIAL HISTORY:  The patient is originally from [name]. 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 The patient's husband [name] was also a professor at [employer], where he taught agriculture oh research. Son [name] works in Land, son [name] works for [employer], daughter [name] is a Education officer, museum, and daughter [name] is also a Education officer, museum, all living in [place]. The patient has 3 grandchildren. She attends [employer]    ADVANCED DIRECTIVES: Not in place   HEALTH MAINTENANCE: Social History  Substance Use Topics  . Smoking status: Never Smoker  . Smokeless tobacco: Never Used  . Alcohol use Yes     Comment: rarely     Colonoscopy:Never  PAP: November 2017  Bone density: Never   Allergies  Allergen Reactions  . Camoquin [Amodiaquine] Itching    Current Outpatient Prescriptions  Medication Sig Dispense Refill  . dexamethasone (DECADRON) 4 MG tablet Take 2 tablets by mouth once a day on the day after chemotherapy and then take 2 tablets two times a day for 2 days. Take with food. 30 tablet 1  . lidocaine-prilocaine (EMLA) cream Apply to affected area once 30 g 3  . loratadine (CLARITIN) 10 MG tablet Take 1 tablet (10 mg total) by mouth daily. (Patient taking differently: Take 10 mg by mouth daily. Takes with neulasta) 30 tablet 3  . LORazepam (ATIVAN) 0.5 MG tablet Take 1 tablet (0.5 mg total) by mouth at bedtime as needed (Nausea or vomiting). 30 tablet 0  . losartan (COZAAR) 50 MG tablet Take 1 tablet (50 mg total) by mouth daily. 90 tablet 2  . pravastatin (PRAVACHOL) 40 MG tablet Take 1 tablet (40 mg total) by mouth at bedtime. 90 tablet 2  . prochlorperazine (COMPAZINE) 10 MG tablet Take 1  tablet (10 mg total) by mouth every 6 (six) hours as needed (Nausea or vomiting). 30 tablet 1   No current facility-administered medications for this visit.     OBJECTIVE: Middle-aged African-American woman Who appears stated age  61:   07/07/16 0833  BP: (!) 156/90  Pulse: 93  Resp: 18  Temp: 98.5 F (36.9 C)     Body mass index is 33.71 kg/m.    ECOG FS:1 - Symptomatic but completely ambulatory  Sclerae unicteric, EOMs intact Oropharynx clear and moist No cervical or supraclavicular adenopathy Lungs no rales or rhonchi Heart regular rate and rhythm Abd soft, nontender, positive bowel sounds MSK no focal spinal tenderness, no upper extremity lymphedema Neuro: nonfocal, well oriented, appropriate affect Breasts: Deferred  LAB RESULTS:  CMP     Component Value Date/Time   NA 141 06/30/2016 1125   K 4.1 06/30/2016 1125   CL 102 01/26/2016 1629   CO2 28 06/30/2016 1125   GLUCOSE 107 06/30/2016 1125   BUN 9.9 06/30/2016 1125   CREATININE 0.8 06/30/2016 1125   CALCIUM 9.1 06/30/2016 1125   PROT 6.2 (L) 06/30/2016 1125   ALBUMIN 3.5 06/30/2016 1125   AST 12  06/30/2016 1125   ALT 30 06/30/2016 1125   ALKPHOS 105 06/30/2016 1125   BILITOT 0.49 06/30/2016 1125    INo results found for: SPEP, UPEP  CBC    Component Value Date/Time   WBC 8.1 07/07/2016 0757   WBC 7.6 01/23/2014 1431   RBC 4.55 07/07/2016 0757   RBC 5.41 (H) 01/23/2014 1431   HGB 11.8 07/07/2016 0757   HCT 36.0 07/07/2016 0757   PLT 152 07/07/2016 0757   MCV 79.2 (L) 07/07/2016 0757   MCH 25.9 07/07/2016 0757   MCH 25.0 (L) 01/23/2014 1431   MCHC 32.7 07/07/2016 0757   MCHC 33.8 01/23/2014 1431   RDW 16.1 (H) 07/07/2016 0757   LYMPHSABS 1.7 07/07/2016 0757   MONOABS 0.5 07/07/2016 0757   EOSABS 0.0 07/07/2016 0757   BASOSABS 0.1 07/07/2016 0757        Chemistry      Component Value Date/Time   NA 141 06/30/2016 1125   K 4.1 06/30/2016 1125   CL 102 01/26/2016 1629   CO2 28  06/30/2016 1125   BUN 9.9 06/30/2016 1125   CREATININE 0.8 06/30/2016 1125      Component Value Date/Time   CALCIUM 9.1 06/30/2016 1125   ALKPHOS 105 06/30/2016 1125   AST 12 06/30/2016 1125   ALT 30 06/30/2016 1125   BILITOT 0.49 06/30/2016 1125       No results found for: LABCA2  No components found for: LABCA125  No results for input(s): INR in the last 168 hours.  Urinalysis    Component Value Date/Time   COLORURINE yellow 06/01/2006 1116   APPEARANCEUR Clear 06/01/2006 1116   LABSPEC 1.010 06/01/2006 1116   PHURINE 6.5 06/01/2006 1116   HGBUR negative 06/01/2006 1116   BILIRUBINUR negative 06/01/2006 1116   UROBILINOGEN 0.2 06/01/2006 1116   NITRITE negative 06/01/2006 1116     STUDIES: No results found.  ELIGIBLE FOR AVAILABLE RESEARCH PROTOCOL: None to candidate for Prevent  ASSESSMENT: 61 y.o. [place] woman status post left breast upper inner quadrant and left axillary lymph node biopsy every 16 2018, both showing invasive ductal carcinoma, grade 3, clinically T1c N1a, or stage IIB, estrogen receptor positive, progesterone receptor and HER-2 negative, with an MIB-1 of 40%  (a) MRI biopsy 05/25/2016 of a second area of concern in the left breast also shows invasive ductal carcinoma  (1) neoadjuvant anastrozole started 05/11/2016, held at the start of chemotherapy  (2) Mammaprint results shows the tumor to be luminal B, high risk, indicating a need for chemotherapy.  (3) chemotherapy will consist of doxorubicin and cyclophosphamide in dose dense fashion 4 starting 06/09/2016, followed by weekly paclitaxel 12  (4) breast conserving surgery with targeted axillary dissection to follow  (5) adjuvant radiation as appropriate  (6) adjuvant anti-estrogens to be continued a minimum of 5 years    PLAN: Tanya [name] Is tolerating her chemotherapy remarkably well and she will proceed to cycle 3 of doxorubicin and cyclophosphamide today.  I am adding omeprazole  to her medications. She should take that every night for the next week to help with the problem with heartburn.  We discussed the "gas" issue and she will try simethicone, but she understands this is a normal problem that will eventually resolve.  I strongly encouraged her to start a walking program.  She will see me again in one week. She knows to call for any problems that may develop before the next visit.  Chauncey Cruel, MD   07/07/2016 8:36 AM Medical  Oncology and Hematology [facility] [address] [place], [place] [address] Tel. [phone]    Fax. [phone]

## 2016-07-09 ENCOUNTER — Ambulatory Visit: Payer: BLUE CROSS/BLUE SHIELD

## 2016-07-14 ENCOUNTER — Other Ambulatory Visit (HOSPITAL_BASED_OUTPATIENT_CLINIC_OR_DEPARTMENT_OTHER): Payer: BLUE CROSS/BLUE SHIELD

## 2016-07-14 ENCOUNTER — Ambulatory Visit (HOSPITAL_BASED_OUTPATIENT_CLINIC_OR_DEPARTMENT_OTHER): Payer: BLUE CROSS/BLUE SHIELD | Admitting: Oncology

## 2016-07-14 VITALS — BP 129/84 | HR 92 | Temp 98.3°F | Resp 18 | Ht 63.0 in | Wt 186.8 lb

## 2016-07-14 DIAGNOSIS — C50412 Malignant neoplasm of upper-outer quadrant of left female breast: Secondary | ICD-10-CM | POA: Diagnosis not present

## 2016-07-14 DIAGNOSIS — C773 Secondary and unspecified malignant neoplasm of axilla and upper limb lymph nodes: Secondary | ICD-10-CM | POA: Diagnosis not present

## 2016-07-14 DIAGNOSIS — Z170 Estrogen receptor positive status [ER+]: Secondary | ICD-10-CM | POA: Diagnosis not present

## 2016-07-14 DIAGNOSIS — G4700 Insomnia, unspecified: Secondary | ICD-10-CM

## 2016-07-14 DIAGNOSIS — R530 Neoplastic (malignant) related fatigue: Secondary | ICD-10-CM

## 2016-07-14 LAB — COMPREHENSIVE METABOLIC PANEL
ALBUMIN: 3.6 g/dL (ref 3.5–5.0)
ALK PHOS: 114 U/L (ref 40–150)
ALT: 29 U/L (ref 0–55)
AST: 17 U/L (ref 5–34)
Anion Gap: 8 mEq/L (ref 3–11)
BUN: 11.1 mg/dL (ref 7.0–26.0)
CHLORIDE: 103 meq/L (ref 98–109)
CO2: 29 meq/L (ref 22–29)
Calcium: 9.1 mg/dL (ref 8.4–10.4)
Creatinine: 0.7 mg/dL (ref 0.6–1.1)
Glucose: 84 mg/dl (ref 70–140)
POTASSIUM: 4.5 meq/L (ref 3.5–5.1)
Sodium: 140 mEq/L (ref 136–145)
Total Bilirubin: 0.45 mg/dL (ref 0.20–1.20)
Total Protein: 6.5 g/dL (ref 6.4–8.3)

## 2016-07-14 LAB — CBC WITH DIFFERENTIAL/PLATELET
BASO%: 0.7 % (ref 0.0–2.0)
Basophils Absolute: 0 10*3/uL (ref 0.0–0.1)
EOS ABS: 0 10*3/uL (ref 0.0–0.5)
EOS%: 0.7 % (ref 0.0–7.0)
HEMATOCRIT: 37.4 % (ref 34.8–46.6)
HGB: 12 g/dL (ref 11.6–15.9)
LYMPH#: 1.6 10*3/uL (ref 0.9–3.3)
LYMPH%: 40.4 % (ref 14.0–49.7)
MCH: 25.5 pg (ref 25.1–34.0)
MCHC: 32 g/dL (ref 31.5–36.0)
MCV: 79.7 fL (ref 79.5–101.0)
MONO#: 0.2 10*3/uL (ref 0.1–0.9)
MONO%: 6.2 % (ref 0.0–14.0)
NEUT#: 2 10*3/uL (ref 1.5–6.5)
NEUT%: 52 % (ref 38.4–76.8)
PLATELETS: 118 10*3/uL — AB (ref 145–400)
RBC: 4.7 10*6/uL (ref 3.70–5.45)
RDW: 16.7 % — ABNORMAL HIGH (ref 11.2–14.5)
WBC: 3.9 10*3/uL (ref 3.9–10.3)

## 2016-07-14 NOTE — Progress Notes (Signed)
[place]  Telephone:([phone] Fax:(336) [phone]     ID: Tanya [name] DOB: 1955/04/14  MR#: [id number]  BDZ#:[id number]  Patient Care Team: Everrett Coombe, MD as PCP - General Rolm Bookbinder, MD as Consulting Physician (General Surgery) Chauncey Cruel, MD as Consulting Physician (Oncology) Kyung Rudd, MD as Consulting Physician (Radiation Oncology) Chauncey Cruel, MD OTHER MD:  CHIEF COMPLAINT: Estrogen receptor positive breast cancer  CURRENT TREATMENT: Neoadjuvant chemotherapy   BREAST CANCER HISTORY: From the original intake note:  Tanya [name] had routine bilateral screening mammography at the [facility] 04/19/2016. This showed a possible area of the symmetry in the left breast and a prominent left axillary lymph nodes. On 04/28/2016 she underwent diagnostic left mammography with tomography and left breast ultrasonography. This showed the breast density to be category B. There was an obscured mass in the upper outer left breast and an enlarged left axillary lymph node. On exam there was focal thickening at the 2:00 position of the left breast 9 cm from the nipple. Targeted ultrasonography confirmed a 1.8 cm hypoechoic mass with a second adjacent 1.1 cm mass both at the 2:00 position of the left breast 9 cm from the nipple. The conglomerate area measured 3.5 cm. There was also an enlarged left axillary lymph node with loss of fatty hilum measuring 1.8 cm.  On family 16 2018 the patient underwent biopsy of the larger left breast mass in question and the suspicious left axillary lymph node. 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  The patient's subsequent history is as detailed below  INTERVAL HISTORY: Tanya [name] returns today for  follow-up of her estrogen receptor positive breast cancer, accompanied by her husband and one of her daughters. Today is day 8 cycle 3 of 4 planned cycles of doxorubicin and cyclophosphamide, given every 2 weeks, to be followed by 12 weekly doses of paclitaxel.  REVIEW OF SYSTEMS: Tanya [name] has felt more tired these last few days. 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 She said decreased appetite and lost about 3 pounds in the last week. She has some nausea but no vomiting. She sleeps poorly. This is not a new problem. She sleeps with her lites on. Aside from these issues a detailed review of systems today was stable  PAST MEDICAL HISTORY: Past Medical History:  Diagnosis Date  . ANXIETY   . Dyspnea on exertion   . HYPERLIPIDEMIA   . HYPERTENSION, BENIGN ESSENTIAL   . Malignant neoplasm of upper-outer quadrant of left breast in female, estrogen receptor positive ([place]) 05/04/2016  . OBESITY     PAST SURGICAL HISTORY: Past Surgical History:  Procedure Laterality Date  . CESAREAN SECTION    . PORTACATH PLACEMENT Right 06/02/2016   Procedure: INSERTION PORT-A-CATH WITH ULTRASOUND GUIDANCE;  Surgeon: Rolm Bookbinder, MD;  Location: [facility];  Service: General;  Laterality: Right;  . TUBAL LIGATION      FAMILY HISTORY Family History  Problem Relation Age of Onset  . Breast cancer Mother 75  The patient's father had a history of prostate cancer. He died at age 31. The patient's mother died at age 36 from unclear causes. She had a history of breast cancer diagnosed in her 23s. The patient had  4 brothers, 2 sisters. There is no other history of breast or ovarian cancer in the family to the patient's knowledge  GYNECOLOGIC HISTORY:  No LMP recorded. Patient is postmenopausal. Menarche age 89, first live birth age 24, the patient is [place] P4. She stopped having periods approximately 2006. She did  not use hormone replacement. She used oral contraceptives briefly and remotely, with no complications.  SOCIAL HISTORY:  The patient is originally from [name]. 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 The patient's husband Tanya [name] was also a professor at [employer], where he taught agriculture oh research. Son Tanya [name] works in Land, son Tanya [name] works for [employer], daughter Tanya [name] is a Education officer, museum, and daughter Tanya [name] is also a Education officer, museum, all living in [place]. The patient has 3 grandchildren. She attends [employer]    ADVANCED DIRECTIVES: Not in place   HEALTH MAINTENANCE: Social History  Substance Use Topics  . Smoking status: Never Smoker  . Smokeless tobacco: Never Used  . Alcohol use Yes     Comment: rarely     Colonoscopy:Never  PAP: November 2017  Bone density: Never   Allergies  Allergen Reactions  . Camoquin [Amodiaquine] Itching    Current Outpatient Prescriptions  Medication Sig Dispense Refill  . dexamethasone (DECADRON) 4 MG tablet Take 2 tablets by mouth once a day on the day after chemotherapy and then take 2 tablets two times a day for 2 days. Take with food. 30 tablet 1  . lidocaine-prilocaine (EMLA) cream Apply to affected area once 30 g 3  . loratadine (CLARITIN) 10 MG tablet Take 1 tablet (10 mg total) by mouth daily. (Patient taking differently: Take 10 mg by mouth daily. Takes with neulasta) 30 tablet 3  . LORazepam (ATIVAN) 0.5 MG tablet Take 1 tablet (0.5 mg total) by mouth at bedtime as needed (Nausea or vomiting). 30 tablet 0  . losartan (COZAAR) 50 MG tablet Take 1 tablet (50 mg total) by mouth daily. 90 tablet 2  . omeprazole (PRILOSEC) 40 MG capsule Take 1 capsule (40 mg total) by mouth daily. 90 capsule 3  . pravastatin (PRAVACHOL) 40 MG tablet Take 1 tablet (40 mg total) by mouth at bedtime. 90 tablet 2  . prochlorperazine (COMPAZINE) 10 MG tablet Take 1 tablet (10 mg total) by mouth every 6 (six) hours as needed  (Nausea or vomiting). 30 tablet 1  . simethicone (MYLICON) 80 MG chewable tablet Chew 80 mg by mouth every 6 (six) hours as needed.     No current facility-administered medications for this visit.     OBJECTIVE: Middle-aged African-American womanIn no acute distress  Vitals:   07/14/16 1225  BP: 129/84  Pulse: 92  Resp: 18  Temp: 98.3 F (36.8 C)     Body mass index is 33.09 kg/m.    ECOG FS:1 - Symptomatic but completely ambulatory  Sclerae unicteric, pupils round and equal Oropharynx clear and moist No cervical or supraclavicular adenopathy Lungs no rales or rhonchi Heart regular rate and rhythm Abd soft, nontender, positive bowel sounds MSK no focal spinal tenderness, no upper extremity lymphedema Neuro: nonfocal, well oriented, appropriate affect Breasts: I do not feel a mass in either breast, and there are no skin or nipple changes of concern bilaterally. Both axillae are benign.  LAB RESULTS:  CMP     Component Value Date/Time   NA 141 07/07/2016 0757   K 4.0 07/07/2016 0757   CL 102 01/26/2016 1629   CO2 25 07/07/2016 0757  GLUCOSE 149 (H) 07/07/2016 0757   BUN 14.6 07/07/2016 0757   CREATININE 0.8 07/07/2016 0757   CALCIUM 9.0 07/07/2016 0757   PROT 6.3 (L) 07/07/2016 0757   ALBUMIN 3.7 07/07/2016 0757   AST 16 07/07/2016 0757   ALT 30 07/07/2016 0757   ALKPHOS 88 07/07/2016 0757   BILITOT 0.31 07/07/2016 0757    INo results found for: SPEP, UPEP  CBC    Component Value Date/Time   WBC 3.9 07/14/2016 1201   WBC 7.6 01/23/2014 1431   RBC 4.70 07/14/2016 1201   RBC 5.41 (H) 01/23/2014 1431   HGB 12.0 07/14/2016 1201   HCT 37.4 07/14/2016 1201   PLT 118 (L) 07/14/2016 1201   MCV 79.7 07/14/2016 1201   MCH 25.5 07/14/2016 1201   MCH 25.0 (L) 01/23/2014 1431   MCHC 32.0 07/14/2016 1201   MCHC 33.8 01/23/2014 1431   RDW 16.7 (H) 07/14/2016 1201   LYMPHSABS 1.6 07/14/2016 1201   MONOABS 0.2 07/14/2016 1201   EOSABS 0.0 07/14/2016 1201   BASOSABS  0.0 07/14/2016 1201        Chemistry      Component Value Date/Time   NA 141 07/07/2016 0757   K 4.0 07/07/2016 0757   CL 102 01/26/2016 1629   CO2 25 07/07/2016 0757   BUN 14.6 07/07/2016 0757   CREATININE 0.8 07/07/2016 0757      Component Value Date/Time   CALCIUM 9.0 07/07/2016 0757   ALKPHOS 88 07/07/2016 0757   AST 16 07/07/2016 0757   ALT 30 07/07/2016 0757   BILITOT 0.31 07/07/2016 0757       No results found for: LABCA2  No components found for: [id number]  No results for input(s): INR in the last 168 hours.  Urinalysis    Component Value Date/Time   COLORURINE yellow 06/01/2006 1116   APPEARANCEUR Clear 06/01/2006 1116   LABSPEC 1.010 06/01/2006 1116   PHURINE 6.5 06/01/2006 1116   HGBUR negative 06/01/2006 1116   BILIRUBINUR negative 06/01/2006 1116   UROBILINOGEN 0.2 06/01/2006 1116   NITRITE negative 06/01/2006 1116     STUDIES: No results found.  ELIGIBLE FOR AVAILABLE RESEARCH PROTOCOL: None to candidate for Prevent  ASSESSMENT: 61 y.o. [place] woman status post left breast upper inner quadrant and left axillary lymph node biopsy every 16 2018, both showing invasive ductal carcinoma, grade 3, clinically T1c N1a, or stage IIB, estrogen receptor positive, progesterone receptor and HER-2 negative, with an MIB-1 of 40%  (a) MRI biopsy 05/25/2016 of a second area of concern in the left breast also shows invasive ductal carcinoma  (1) neoadjuvant anastrozole started 05/11/2016, held at the start of chemotherapy  (2) Mammaprint results shows the tumor to be luminal B, high risk, indicating a need for chemotherapy.  (3) chemotherapy will consist of doxorubicin and cyclophosphamide in dose dense fashion 4 starting 06/09/2016, followed by weekly paclitaxel 12  (4) breast conserving surgery with targeted axillary dissection to follow  (5) adjuvant radiation as appropriate  (6) adjuvant anti-estrogens to be continued a minimum of 5  years    PLAN: [place] Continues to tolerate her chemotherapy well and her counts today are remarkably good.  She is feeling very fatigued. I think it would be helpful to her to take walks. Her daughter is going to take her out in the early morning and late evening so she doesn't have problems with sun sensitivity.  She has a long-standing history of insomnia. Today we talked about dimming the lites (she  sleeps with the lights full on) and keeping the room call to her face is cold.  Otherwise she will return in 1 week. Assuming all is well at that time she will receive her final cycle of cyclophosphamide and doxorubicin.  She was interested in a repeat MRI at this point but I think the fact that I cannot feel a mass in her left breast is already an indication of response. We will repeat an MRI of the breast after she completes her taxing treatments.  She knows to call for any other problems that may develop before her next visit.  Chauncey Cruel, MD   07/14/2016 12:55 PM Medical Oncology and Hematology [facility] [address] [place], [place] [address] Tel. [phone]    Fax. [phone]

## 2016-07-21 ENCOUNTER — Ambulatory Visit (HOSPITAL_BASED_OUTPATIENT_CLINIC_OR_DEPARTMENT_OTHER): Payer: BLUE CROSS/BLUE SHIELD

## 2016-07-21 ENCOUNTER — Encounter: Payer: Self-pay | Admitting: *Deleted

## 2016-07-21 ENCOUNTER — Other Ambulatory Visit (HOSPITAL_BASED_OUTPATIENT_CLINIC_OR_DEPARTMENT_OTHER): Payer: BLUE CROSS/BLUE SHIELD

## 2016-07-21 ENCOUNTER — Ambulatory Visit (HOSPITAL_BASED_OUTPATIENT_CLINIC_OR_DEPARTMENT_OTHER): Payer: BLUE CROSS/BLUE SHIELD | Admitting: Oncology

## 2016-07-21 VITALS — BP 135/90 | HR 105 | Temp 98.4°F | Resp 18 | Ht 63.0 in | Wt 188.2 lb

## 2016-07-21 VITALS — HR 89

## 2016-07-21 DIAGNOSIS — Z5189 Encounter for other specified aftercare: Secondary | ICD-10-CM

## 2016-07-21 DIAGNOSIS — C50412 Malignant neoplasm of upper-outer quadrant of left female breast: Secondary | ICD-10-CM

## 2016-07-21 DIAGNOSIS — L819 Disorder of pigmentation, unspecified: Secondary | ICD-10-CM

## 2016-07-21 DIAGNOSIS — R530 Neoplastic (malignant) related fatigue: Secondary | ICD-10-CM

## 2016-07-21 DIAGNOSIS — C773 Secondary and unspecified malignant neoplasm of axilla and upper limb lymph nodes: Secondary | ICD-10-CM | POA: Diagnosis not present

## 2016-07-21 DIAGNOSIS — Z170 Estrogen receptor positive status [ER+]: Principal | ICD-10-CM

## 2016-07-21 DIAGNOSIS — Z5111 Encounter for antineoplastic chemotherapy: Secondary | ICD-10-CM | POA: Diagnosis not present

## 2016-07-21 LAB — CBC WITH DIFFERENTIAL/PLATELET
BASO%: 0.6 % (ref 0.0–2.0)
BASOS ABS: 0.1 10*3/uL (ref 0.0–0.1)
EOS ABS: 0 10*3/uL (ref 0.0–0.5)
EOS%: 0.1 % (ref 0.0–7.0)
HCT: 36.3 % (ref 34.8–46.6)
HGB: 11.6 g/dL (ref 11.6–15.9)
LYMPH%: 22.2 % (ref 14.0–49.7)
MCH: 26.1 pg (ref 25.1–34.0)
MCHC: 32 g/dL (ref 31.5–36.0)
MCV: 81.6 fL (ref 79.5–101.0)
MONO#: 0.7 10*3/uL (ref 0.1–0.9)
MONO%: 7.7 % (ref 0.0–14.0)
NEUT#: 6.1 10*3/uL (ref 1.5–6.5)
NEUT%: 69.4 % (ref 38.4–76.8)
Platelets: 214 10*3/uL (ref 145–400)
RBC: 4.45 10*6/uL (ref 3.70–5.45)
RDW: 17.7 % — ABNORMAL HIGH (ref 11.2–14.5)
WBC: 8.8 10*3/uL (ref 3.9–10.3)
lymph#: 1.9 10*3/uL (ref 0.9–3.3)

## 2016-07-21 LAB — COMPREHENSIVE METABOLIC PANEL
ALBUMIN: 3.7 g/dL (ref 3.5–5.0)
ALT: 18 U/L (ref 0–55)
AST: 14 U/L (ref 5–34)
Alkaline Phosphatase: 97 U/L (ref 40–150)
Anion Gap: 9 mEq/L (ref 3–11)
BUN: 10 mg/dL (ref 7.0–26.0)
CO2: 27 meq/L (ref 22–29)
CREATININE: 0.8 mg/dL (ref 0.6–1.1)
Calcium: 9.2 mg/dL (ref 8.4–10.4)
Chloride: 107 mEq/L (ref 98–109)
EGFR: 89 mL/min/{1.73_m2} — AB (ref >90)
Glucose: 176 mg/dl — ABNORMAL HIGH (ref 70–140)
Potassium: 4.2 mEq/L (ref 3.5–5.1)
SODIUM: 143 meq/L (ref 136–145)
TOTAL PROTEIN: 6.6 g/dL (ref 6.4–8.3)
Total Bilirubin: 0.39 mg/dL (ref 0.20–1.20)

## 2016-07-21 MED ORDER — DOXORUBICIN HCL CHEMO IV INJECTION 2 MG/ML
60.0000 mg/m2 | Freq: Once | INTRAVENOUS | Status: AC
  Administered 2016-07-21: 120 mg via INTRAVENOUS
  Filled 2016-07-21: qty 60

## 2016-07-21 MED ORDER — SODIUM CHLORIDE 0.9 % IV SOLN
Freq: Once | INTRAVENOUS | Status: AC
  Administered 2016-07-21: 12:00:00 via INTRAVENOUS
  Filled 2016-07-21: qty 5

## 2016-07-21 MED ORDER — SODIUM CHLORIDE 0.9% FLUSH
10.0000 mL | INTRAVENOUS | Status: DC | PRN
  Administered 2016-07-21: 10 mL
  Filled 2016-07-21: qty 10

## 2016-07-21 MED ORDER — PALONOSETRON HCL INJECTION 0.25 MG/5ML
INTRAVENOUS | Status: AC
  Filled 2016-07-21: qty 5

## 2016-07-21 MED ORDER — CYCLOPHOSPHAMIDE CHEMO INJECTION 1 GM
600.0000 mg/m2 | Freq: Once | INTRAMUSCULAR | Status: AC
  Administered 2016-07-21: 1200 mg via INTRAVENOUS
  Filled 2016-07-21: qty 60

## 2016-07-21 MED ORDER — LORAZEPAM 0.5 MG PO TABS: 0.5000 mg | ORAL_TABLET | Freq: Every evening | ORAL | 0 refills | Status: DC | PRN

## 2016-07-21 MED ORDER — PALONOSETRON HCL INJECTION 0.25 MG/5ML
0.2500 mg | Freq: Once | INTRAVENOUS | Status: AC
  Administered 2016-07-21: 0.25 mg via INTRAVENOUS

## 2016-07-21 MED ORDER — SODIUM CHLORIDE 0.9 % IV SOLN
Freq: Once | INTRAVENOUS | Status: AC
  Administered 2016-07-21: 12:00:00 via INTRAVENOUS

## 2016-07-21 MED ORDER — PEGFILGRASTIM 6 MG/0.6ML ~~LOC~~ PSKT
6.0000 mg | PREFILLED_SYRINGE | Freq: Once | SUBCUTANEOUS | Status: AC
  Administered 2016-07-21: 6 mg via SUBCUTANEOUS
  Filled 2016-07-21: qty 0.6

## 2016-07-21 MED ORDER — DEXAMETHASONE 4 MG PO TABS: ORAL_TABLET | ORAL | 0 refills | Status: DC

## 2016-07-21 MED ORDER — HEPARIN SOD (PORK) LOCK FLUSH 100 UNIT/ML IV SOLN
500.0000 [IU] | Freq: Once | INTRAVENOUS | Status: AC | PRN
  Administered 2016-07-21: 500 [IU]
  Filled 2016-07-21: qty 5

## 2016-07-21 NOTE — Patient Instructions (Signed)
[facility] Discharge Instructions for Patients Receiving Chemotherapy  Today you received the following chemotherapy agents:Adriamycin and Cytoxan   To help prevent nausea and vomiting after your treatment, we encourage you to take your nausea medication as directed.    If you develop nausea and vomiting that is not controlled by your nausea medication, call the clinic.   BELOW ARE SYMPTOMS THAT SHOULD BE REPORTED IMMEDIATELY:  *FEVER GREATER THAN 100.5 F  *CHILLS WITH OR WITHOUT FEVER  NAUSEA AND VOMITING THAT IS NOT CONTROLLED WITH YOUR NAUSEA MEDICATION  *UNUSUAL SHORTNESS OF BREATH  *UNUSUAL BRUISING OR BLEEDING  TENDERNESS IN MOUTH AND THROAT WITH OR WITHOUT PRESENCE OF ULCERS  *URINARY PROBLEMS  *BOWEL PROBLEMS  UNUSUAL RASH Items with * indicate a potential emergency and should be followed up as soon as possible.  Feel free to call the clinic you have any questions or concerns. The clinic phone number is [phone].  Please show the CHEMO ALERT CARD at check-in to the Emergency Department and triage nurse.   

## 2016-07-21 NOTE — Progress Notes (Signed)
Tanya [name]  Telephone:(336) [phone] Fax:(336) [phone]     ID: [name] DOB: 02-29-1956  MR#: [id number]  FMB#:[id number]  Patient Care Team: Everrett Coombe, MD as PCP - General Rolm Bookbinder, MD as Consulting Physician (General Surgery) Talullah Abate, Virgie Dad, MD as Consulting Physician (Oncology) Kyung Rudd, MD as Consulting Physician (Radiation Oncology) Chauncey Cruel, MD OTHER MD:  CHIEF COMPLAINT: Estrogen receptor positive breast cancer  CURRENT TREATMENT: Neoadjuvant chemotherapy   BREAST CANCER HISTORY: From the original intake note:  Julio had routine bilateral screening mammography at the [facility] 04/19/2016. This showed a possible area of the symmetry in the left breast and a prominent left axillary lymph nodes. On 04/28/2016 she underwent diagnostic left mammography with tomography and left breast ultrasonography. This showed the breast density to be category B. There was an obscured mass in the upper outer left breast and an enlarged left axillary lymph node. On exam there was focal thickening at the 2:00 position of the left breast 9 cm from the nipple. Targeted ultrasonography confirmed a 1.8 cm hypoechoic mass with a second adjacent 1.1 cm mass both at the 2:00 position of the left breast 9 cm from the nipple. The conglomerate area measured 3.5 cm. There was also an enlarged left axillary lymph node with loss of fatty hilum measuring 1.8 cm.  On family 16 2018 the patient underwent biopsy of the larger left breast mass in question and the suspicious left axillary lymph node. 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  The patient's subsequent history is as detailed below  INTERVAL HISTORY: Tanya [name] returns today for  follow-up of her estrogen receptor positive breast cancer accompanied by her husband. Today is day 1 cycle 4 of 4 planned cycles of cyclophosphamide and doxorubicin given in dose dense fashion, to be followed by weekly paclitaxel 12.  REVIEW OF SYSTEMS: Sotiria complains of fatigue. This has been the main problem for her. She did have a little bit of a cough, which was minimally productive of clear phlegm, not accompanied by a temperature or pleuritic symptoms. She had a little bit of a sore throat with it. She treated with Ginger tea and it cleared. Her mouth is sore she says but she does not have mouth sores. Her urine is not as clear as she would like and its smells like medication she says. She is getting some darkening of the skin particularly on the dorsum of her hands. Aside from these issues a detailed review of systems today was stable  PAST MEDICAL HISTORY: Past Medical History:  Diagnosis Date  . ANXIETY   . Dyspnea on exertion   . HYPERLIPIDEMIA   . HYPERTENSION, BENIGN ESSENTIAL   . Malignant neoplasm of upper-outer quadrant of left breast in female, estrogen receptor positive ([place]) 05/04/2016  . OBESITY     PAST SURGICAL HISTORY: Past Surgical History:  Procedure Laterality Date  . CESAREAN SECTION    . PORTACATH PLACEMENT Right 06/02/2016   Procedure: INSERTION PORT-A-CATH WITH ULTRASOUND GUIDANCE;  Surgeon: Rolm Bookbinder, MD;  Location: [facility];  Service: General;  Laterality: Right;  . TUBAL LIGATION      FAMILY HISTORY Family History  Problem Relation Age of Onset  . Breast cancer Mother 101  The patient's father had a history of prostate cancer. He died  at age 74. The patient's mother died at age 63 from unclear causes. She had a history of breast cancer diagnosed in her 41s. The patient had 4 brothers, 2 sisters. There is no other history of breast or ovarian cancer in the family to the patient's knowledge  GYNECOLOGIC HISTORY:  No LMP  recorded. Patient is postmenopausal. Menarche age 50, first live birth age 68, the patient is [place] P4. She stopped having periods approximately 2006. She did not use hormone replacement. She used oral contraceptives briefly and remotely, with no complications.  SOCIAL HISTORY:  The patient is originally from [name]. 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 The patient's husband [name] was also a professor at [employer], where he taught agriculture oh research. Son [name] works in Land, son [name] works for [employer], daughter [name] is a Education officer, museum, and daughter [name] is also a Education officer, museum, all living in [place]. The patient has 3 grandchildren. She attends [employer]    ADVANCED DIRECTIVES: Not in place   HEALTH MAINTENANCE: Social History  Substance Use Topics  . Smoking status: Never Smoker  . Smokeless tobacco: Never Used  . Alcohol use Yes     Comment: rarely     Colonoscopy:Never  PAP: November 2017  Bone density: Never   Allergies  Allergen Reactions  . Camoquin [Amodiaquine] Itching    Current Outpatient Prescriptions  Medication Sig Dispense Refill  . dexamethasone (DECADRON) 4 MG tablet Take 2 tablets by mouth once a day on the day after chemotherapy and then take 2 tablets two times a day for 2 days. Take with food. 12 tablet 0  . lidocaine-prilocaine (EMLA) cream Apply to affected area once 30 g 3  . loratadine (CLARITIN) 10 MG tablet Take 1 tablet (10 mg total) by mouth daily. (Patient taking differently: Take 10 mg by mouth daily. Takes with neulasta) 30 tablet 3  . LORazepam (ATIVAN) 0.5 MG tablet Take 1 tablet (0.5 mg total) by mouth at bedtime as needed (Nausea or vomiting). 30 tablet 0  . losartan (COZAAR) 50 MG tablet Take 1 tablet (50 mg total) by mouth daily. 90 tablet 2  . omeprazole (PRILOSEC) 40 MG capsule Take 1 capsule (40 mg total) by mouth daily. 90 capsule 3  . pravastatin (PRAVACHOL) 40 MG tablet Take 1 tablet (40  mg total) by mouth at bedtime. 90 tablet 2  . prochlorperazine (COMPAZINE) 10 MG tablet Take 1 tablet (10 mg total) by mouth every 6 (six) hours as needed (Nausea or vomiting). 30 tablet 1  . simethicone (MYLICON) 80 MG chewable tablet Chew 80 mg by mouth every 6 (six) hours as needed.     No current facility-administered medications for this visit.     OBJECTIVE: Middle-aged African-American woman Who appears stated age  61:   07/21/16 1036  BP: 135/90  Pulse: (!) 105  Resp: 18  Temp: 98.4 F (36.9 C)     Body mass index is 33.34 kg/m.    ECOG FS:1 - Symptomatic but completely ambulatory  Sclerae unicteric, pupils round and equal Oropharynx clear and moist No cervical or supraclavicular adenopathy Lungs no rales or rhonchi Heart regular rate and rhythm Abd soft, nontender, positive bowel sounds MSK no focal spinal tenderness, no upper extremity lymphedema Neuro: nonfocal, well oriented, appropriate affect Breasts: Deferred   LAB RESULTS:  CMP     Component Value Date/Time   NA 140 07/14/2016 1201   K 4.5 07/14/2016 1201   CL 102 01/26/2016  1629   CO2 29 07/14/2016 1201   GLUCOSE 84 07/14/2016 1201   BUN 11.1 07/14/2016 1201   CREATININE 0.7 07/14/2016 1201   CALCIUM 9.1 07/14/2016 1201   PROT 6.5 07/14/2016 1201   ALBUMIN 3.6 07/14/2016 1201   AST 17 07/14/2016 1201   ALT 29 07/14/2016 1201   ALKPHOS 114 07/14/2016 1201   BILITOT 0.45 07/14/2016 1201    INo results found for: SPEP, UPEP  CBC    Component Value Date/Time   WBC 8.8 07/21/2016 1008   WBC 7.6 01/23/2014 1431   RBC 4.45 07/21/2016 1008   RBC 5.41 (H) 01/23/2014 1431   HGB 11.6 07/21/2016 1008   HCT 36.3 07/21/2016 1008   PLT 214 07/21/2016 1008   MCV 81.6 07/21/2016 1008   MCH 26.1 07/21/2016 1008   MCH 25.0 (L) 01/23/2014 1431   MCHC 32.0 07/21/2016 1008   MCHC 33.8 01/23/2014 1431   RDW 17.7 (H) 07/21/2016 1008   LYMPHSABS 1.9 07/21/2016 1008   MONOABS 0.7 07/21/2016 1008    EOSABS 0.0 07/21/2016 1008   BASOSABS 0.1 07/21/2016 1008        Chemistry      Component Value Date/Time   NA 140 07/14/2016 1201   K 4.5 07/14/2016 1201   CL 102 01/26/2016 1629   CO2 29 07/14/2016 1201   BUN 11.1 07/14/2016 1201   CREATININE 0.7 07/14/2016 1201      Component Value Date/Time   CALCIUM 9.1 07/14/2016 1201   ALKPHOS 114 07/14/2016 1201   AST 17 07/14/2016 1201   ALT 29 07/14/2016 1201   BILITOT 0.45 07/14/2016 1201       No results found for: LABCA2  No components found for: LABCA125  No results for input(s): INR in the last 168 hours.  Urinalysis    Component Value Date/Time   COLORURINE yellow 06/01/2006 1116   APPEARANCEUR Clear 06/01/2006 1116   LABSPEC 1.010 06/01/2006 1116   PHURINE 6.5 06/01/2006 1116   HGBUR negative 06/01/2006 1116   BILIRUBINUR negative 06/01/2006 1116   UROBILINOGEN 0.2 06/01/2006 1116   NITRITE negative 06/01/2006 1116     STUDIES: No results found.  ELIGIBLE FOR AVAILABLE RESEARCH PROTOCOL: None to candidate for Prevent  ASSESSMENT: 61 y.o.  woman status post left breast upper inner quadrant and left axillary lymph node biopsy every 16 2018, both showing invasive ductal carcinoma, grade 3, clinically T1c N1a, or stage IIB, estrogen receptor positive, progesterone receptor and HER-2 negative, with an MIB-1 of 40%  (a) MRI biopsy 05/25/2016 of a second area of concern in the left breast also shows invasive ductal carcinoma  (1) neoadjuvant anastrozole started 05/11/2016, held at the start of chemotherapy  (2) Mammaprint results shows the tumor to be luminal B, high risk, indicating a need for chemotherapy.  (3) chemotherapy will consist of doxorubicin and cyclophosphamide in dose dense fashion 4 starting 06/09/2016, followed by weekly paclitaxel 12  (4) breast conserving surgery with targeted axillary dissection to follow  (5) adjuvant radiation as appropriate  (6) adjuvant anti-estrogens to be  continued a minimum of 5 years    PLAN: Chrishana completes the first part of her chemotherapy today. Of course this is the more difficult part, with more side effects. She has done remarkably well. She has not required any dose reductions or delays.  She is distressed that she is getting some hyperpigmentation. This will take a long time to clear and it certainly could get worse before it gets better in  other words the Taxol also can cause this problem.  As far as her fatigue is concerned I have strongly suggested she take little walks early in the morning and late in the evening, avoiding the harsh sun  I refilled her nausea medicines and lorazepam today and renew dose instructions just to make sure she took them again correctly, although at this point she is pretty much a veteran.  She will see me again in one week. At that time I will set her up for her weekly paclitaxel doses to start 2 weeks from now.  She knows to call for any problems that may develop before the next visit here.  Chauncey Cruel, MD   07/21/2016 10:41 AM Medical Oncology and Hematology [facility] [address] [place], [place] [address] Tel. [phone]    Fax. [phone]

## 2016-07-23 ENCOUNTER — Ambulatory Visit: Payer: BLUE CROSS/BLUE SHIELD

## 2016-07-28 ENCOUNTER — Ambulatory Visit (HOSPITAL_BASED_OUTPATIENT_CLINIC_OR_DEPARTMENT_OTHER): Payer: BLUE CROSS/BLUE SHIELD | Admitting: Oncology

## 2016-07-28 VITALS — BP 135/75 | HR 100 | Temp 98.4°F | Resp 20 | Ht 63.0 in | Wt 184.2 lb

## 2016-07-28 DIAGNOSIS — I952 Hypotension due to drugs: Secondary | ICD-10-CM

## 2016-07-28 DIAGNOSIS — C50412 Malignant neoplasm of upper-outer quadrant of left female breast: Secondary | ICD-10-CM

## 2016-07-28 DIAGNOSIS — Z170 Estrogen receptor positive status [ER+]: Secondary | ICD-10-CM

## 2016-07-28 NOTE — Progress Notes (Signed)
[place]  [phone] [phone]     ID: Tanya [name] DOB: 12/25/1955  MR#: [id number]  OBS#:[id number]  Patient Care Team: Everrett Coombe, MD as PCP - General Rolm Bookbinder, MD as Consulting Physician (General Surgery) Magrinat, Virgie Dad, MD as Consulting Physician (Oncology) Kyung Rudd, MD as Consulting Physician (Radiation Oncology) Chauncey Cruel, MD OTHER MD:  CHIEF COMPLAINT: Estrogen receptor positive breast cancer  CURRENT TREATMENT: Neoadjuvant chemotherapy   BREAST CANCER HISTORY: From the original intake note:  Tanya [name] had routine bilateral screening mammography at the [facility] 04/19/2016. This showed a possible area of the symmetry in the left breast and a prominent left axillary lymph nodes. On 04/28/2016 she underwent diagnostic left mammography with tomography and left breast ultrasonography. This showed the breast density to be category B. There was an obscured mass in the upper outer left breast and an enlarged left axillary lymph node. On exam there was focal thickening at the 2:00 position of the left breast 9 cm from the nipple. Targeted ultrasonography confirmed a 1.8 cm hypoechoic mass with a second adjacent 1.1 cm mass both at the 2:00 position of the left breast 9 cm from the nipple. The conglomerate area measured 3.5 cm. There was also an enlarged left axillary lymph node with loss of fatty hilum measuring 1.8 cm.  On family 16 2018 the patient underwent biopsy of the larger left breast mass in question and the suspicious left axillary lymph node. 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  The patient's subsequent history is as detailed below  INTERVAL HISTORY: Juleah returns today for  follow-up of her estrogen receptor positive breast cancer. Today is day 8 cycle 4 of 4 planned cycles of cyclophosphamide and doxorubicin which she received in dose dense fashion. These will be followed by weekly paclitaxel  REVIEW OF SYSTEMS: Sariyah did "not too fine" with cycle 4. It has taken her longer each time to recover. She is still feeling woozy. Part of the problem is that her blood pressure has dropped as can happen with chemotherapy. She has not adjusted her blood pressure medication. She has had a sensation of "pulling" in her left side which she interprets as healing. Sometimes she has trouble finding the right word either in English or and equal. She feels very sensitive to noise. She has a slight cough. She had minimal mouth sores. Aside from this a detailed review of systems today was stable.  PAST MEDICAL HISTORY: Past Medical History:  Diagnosis Date  . ANXIETY   . Dyspnea on exertion   . HYPERLIPIDEMIA   . HYPERTENSION, BENIGN ESSENTIAL   . Malignant neoplasm of upper-outer quadrant of left breast in female, estrogen receptor positive ([place]) 05/04/2016  . OBESITY     PAST SURGICAL HISTORY: Past Surgical History:  Procedure Laterality Date  . CESAREAN SECTION    . PORTACATH PLACEMENT Right 06/02/2016   Procedure: INSERTION PORT-A-CATH WITH ULTRASOUND GUIDANCE;  Surgeon: Rolm Bookbinder, MD;  Location: [facility];  Service: General;  Laterality: Right;  . TUBAL LIGATION      FAMILY HISTORY Family History  Problem Relation Age of Onset  . Breast cancer Mother 34  The patient's father had a history of prostate cancer. He died at age 35. The patient's mother died at age 36 from  unclear causes. She had a history of breast cancer diagnosed in her 50s. The patient had 4 brothers, 2 sisters. There is no other history of breast or ovarian cancer in the family to the patient's knowledge  GYNECOLOGIC HISTORY:  No LMP recorded. Patient is  postmenopausal. Menarche age 50, first live birth age 42, the patient is [place] P4. She stopped having periods approximately 2006. She did not use hormone replacement. She used oral contraceptives briefly and remotely, with no complications.  SOCIAL HISTORY:  The patient is originally from [name]. 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 The patient's husband [name] was also a professor at [employer], where he taught agriculture oh research. Son [name] works in Land, son [name] works for [employer], daughter [name] is a Education officer, museum, and daughter [name] is also a Education officer, museum, all living in [place]. The patient has 3 grandchildren. She attends [employer]    ADVANCED DIRECTIVES: Not in place   HEALTH MAINTENANCE: Social History  Substance Use Topics  . Smoking status: Never Smoker  . Smokeless tobacco: Never Used  . Alcohol use Yes     Comment: rarely     Colonoscopy:Never  PAP: November 2017  Bone density: Never   Allergies  Allergen Reactions  . Camoquin [Amodiaquine] Itching    Current Outpatient Prescriptions  Medication Sig Dispense Refill  . dexamethasone (DECADRON) 4 MG tablet Take 2 tablets by mouth once a day on the day after chemotherapy and then take 2 tablets two times a day for 2 days. Take with food. 12 tablet 0  . lidocaine-prilocaine (EMLA) cream Apply to affected area once 30 g 3  . loratadine (CLARITIN) 10 MG tablet Take 1 tablet (10 mg total) by mouth daily. (Patient taking differently: Take 10 mg by mouth daily. Takes with neulasta) 30 tablet 3  . LORazepam (ATIVAN) 0.5 MG tablet Take 1 tablet (0.5 mg total) by mouth at bedtime as needed (Nausea or vomiting). 30 tablet 0  . losartan (COZAAR) 50 MG tablet Take 1 tablet (50 mg total) by mouth daily. 90 tablet 2  . omeprazole (PRILOSEC) 40 MG capsule Take 1 capsule (40 mg total) by mouth daily. 90 capsule 3  . pravastatin (PRAVACHOL) 40 MG tablet Take 1 tablet (40 mg total) by mouth  at bedtime. 90 tablet 2  . prochlorperazine (COMPAZINE) 10 MG tablet Take 1 tablet (10 mg total) by mouth every 6 (six) hours as needed (Nausea or vomiting). 30 tablet 1  . simethicone (MYLICON) 80 MG chewable tablet Chew 80 mg by mouth every 6 (six) hours as needed.     No current facility-administered medications for this visit.     OBJECTIVE: Middle-aged African-American woman Who appears stated age  61:   07/28/16 1228  BP: 135/75  Pulse: 100  Resp: 20  Temp: 98.4 F (36.9 C)     Body mass index is 32.63 kg/m.    ECOG FS:1 - Symptomatic but completely ambulatory  Sclerae unicteric, EOMs intact Oropharynx clear and moist No cervical or supraclavicular adenopathy Lungs no rales or rhonchi Heart regular rate and rhythm Abd soft, nontender, positive bowel sounds MSK no focal spinal tenderness, no upper extremity lymphedema Neuro: nonfocal, well oriented, appropriate affect Breasts: The right breast is unremarkable. The left breast is status post biopsy. I do not feel a well-defined mass. There are no skin or nipple changes of concern. Both axillae are benign.   LAB RESULTS:  CMP     Component Value Date/Time  NA 143 07/21/2016 1008   K 4.2 07/21/2016 1008   CL 102 01/26/2016 1629   CO2 27 07/21/2016 1008   GLUCOSE 176 (H) 07/21/2016 1008   BUN 10.0 07/21/2016 1008   CREATININE 0.8 07/21/2016 1008   CALCIUM 9.2 07/21/2016 1008   PROT 6.6 07/21/2016 1008   ALBUMIN 3.7 07/21/2016 1008   AST 14 07/21/2016 1008   ALT 18 07/21/2016 1008   ALKPHOS 97 07/21/2016 1008   BILITOT 0.39 07/21/2016 1008    INo results found for: SPEP, UPEP  CBC    Component Value Date/Time   WBC 8.8 07/21/2016 1008   WBC 7.6 01/23/2014 1431   RBC 4.45 07/21/2016 1008   RBC 5.41 (H) 01/23/2014 1431   HGB 11.6 07/21/2016 1008   HCT 36.3 07/21/2016 1008   PLT 214 07/21/2016 1008   MCV 81.6 07/21/2016 1008   MCH 26.1 07/21/2016 1008   MCH 25.0 (L) 01/23/2014 1431   MCHC 32.0  07/21/2016 1008   MCHC 33.8 01/23/2014 1431   RDW 17.7 (H) 07/21/2016 1008   LYMPHSABS 1.9 07/21/2016 1008   MONOABS 0.7 07/21/2016 1008   EOSABS 0.0 07/21/2016 1008   BASOSABS 0.1 07/21/2016 1008        Chemistry      Component Value Date/Time   NA 143 07/21/2016 1008   K 4.2 07/21/2016 1008   CL 102 01/26/2016 1629   CO2 27 07/21/2016 1008   BUN 10.0 07/21/2016 1008   CREATININE 0.8 07/21/2016 1008      Component Value Date/Time   CALCIUM 9.2 07/21/2016 1008   ALKPHOS 97 07/21/2016 1008   AST 14 07/21/2016 1008   ALT 18 07/21/2016 1008   BILITOT 0.39 07/21/2016 1008       No results found for: LABCA2  No components found for: LABCA125  No results for input(s): INR in the last 168 hours.  Urinalysis    Component Value Date/Time   COLORURINE yellow 06/01/2006 1116   APPEARANCEUR Clear 06/01/2006 1116   LABSPEC 1.010 06/01/2006 1116   PHURINE 6.5 06/01/2006 1116   HGBUR negative 06/01/2006 1116   BILIRUBINUR negative 06/01/2006 1116   UROBILINOGEN 0.2 06/01/2006 1116   NITRITE negative 06/01/2006 1116     STUDIES: No results found.  ELIGIBLE FOR AVAILABLE RESEARCH PROTOCOL: Not a candidate for Prevent (already on a statin drug)  ASSESSMENT: 61 y.o. [place] woman status post left breast upper inner quadrant and left axillary lymph node biopsy every 16 2018, both showing invasive ductal carcinoma, grade 3, clinically T1c N1a, or stage IIB, estrogen receptor positive, progesterone receptor and HER-2 negative, with an MIB-1 of 40%  (a) MRI biopsy 05/25/2016 of a second area of concern in the left breast also shows invasive ductal carcinoma  (1) neoadjuvant anastrozole started 05/11/2016, held at the start of chemotherapy  (2) Mammaprint results shows the tumor to be luminal B, high risk, indicating a need for chemotherapy.  (3) chemotherapy will consist of doxorubicin and cyclophosphamide in dose dense fashion 4 starting 06/09/2016, completed 07/21/2016  to be followed by weekly paclitaxel 12  (4) breast conserving surgery with targeted axillary dissection to follow  (5) adjuvant radiation as appropriate  (6) adjuvant anti-estrogens to be continued a minimum of 5 years    PLAN: Kaitlynd Is done with the first, more difficult part of her chemotherapy. We could push ahead and treat her next week but I think it would be much better to give her a week off and then start her  on 08/11/2016. She will be feeling considerably better by then, she will have enjoyed the long weekend, and she will being Mariel Craft to get through the next 12 cycles of chemotherapy which will be Taxol.  Today we discussed the possible toxicities, side effects and complications of Taxol and I particularly warned her regarding peripheral neuropathy symptoms.  With the Taxol she will take only either Compazine or Zofran, the evening of chemotherapy and the next morning only. Of course she may take it more necessary but most likely she will not needed at all after the first cycle or 2.  She is very encouraged that we do not feel any mass in either breast at this point.  I had to her Cozaar in half. Hopefully this will prevent further problems with hypotension. She also encouraged her to hydrate herself aggressively.  She knows to call for any problems that may develop before the next visit here.  Chauncey Cruel, MD   07/28/2016 12:31 PM Medical Oncology and Hematology [facility] [address] [place], [place] [address] Tel. [phone]    Fax. [phone]

## 2016-08-01 ENCOUNTER — Telehealth: Payer: Self-pay | Admitting: *Deleted

## 2016-08-01 NOTE — Telephone Encounter (Signed)
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  Per discussion pt was able to understand - starting therapy this week may cause delay due to possible symptoms and need for break to allow her body to gain strength for next regimen.  Tanya [name] verbalized appreciation of discussion. No further needs.

## 2016-08-01 NOTE — Telephone Encounter (Signed)
Voicemail received from patient "I'd like to talk to Dr. Jana Hakim".  Returned call.    "I'd like to begin the chemotherapy this Thursday.  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  I'm feeling much better.  Please have him call me either way."

## 2016-08-04 ENCOUNTER — Other Ambulatory Visit: Payer: BLUE CROSS/BLUE SHIELD

## 2016-08-04 ENCOUNTER — Ambulatory Visit: Payer: BLUE CROSS/BLUE SHIELD

## 2016-08-10 NOTE — Progress Notes (Signed)
[place]  [phone] [phone]) [phone]     ID: Tanya [name] DOB: October 09, 1955  MR#: [id number]  KTG#:[id number]  Patient Care Team: Everrett Coombe, MD as PCP - General Rolm Bookbinder, MD as Consulting Physician (General Surgery) Magrinat, Virgie Dad, MD as Consulting Physician (Oncology) Kyung Rudd, MD as Consulting Physician (Radiation Oncology) Scot Dock, NP OTHER MD:  CHIEF COMPLAINT: Estrogen receptor positive breast cancer  CURRENT TREATMENT: Neoadjuvant chemotherapy   BREAST CANCER HISTORY: From the original intake note:  Tanya [name] had routine bilateral screening mammography at the [facility] 04/19/2016. This showed a possible area of the symmetry in the left breast and a prominent left axillary lymph nodes. On 04/28/2016 she underwent diagnostic left mammography with tomography and left breast ultrasonography. This showed the breast density to be category B. There was an obscured mass in the upper outer left breast and an enlarged left axillary lymph node. On exam there was focal thickening at the 2:00 position of the left breast 9 cm from the nipple. Targeted ultrasonography confirmed a 1.8 cm hypoechoic mass with a second adjacent 1.1 cm mass both at the 2:00 position of the left breast 9 cm from the nipple. The conglomerate area measured 3.5 cm. There was also an enlarged left axillary lymph node with loss of fatty hilum measuring 1.8 cm.  On family 16 2018 the patient underwent biopsy of the larger left breast mass in question and the suspicious left axillary lymph node. 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  The patient's subsequent history is as detailed below  INTERVAL HISTORY: Tanya [name] is here today for  evaluation prior to starting weekly Paclitaxel.  She is doing well today.  She had a flu like feeling last week that has since resolved.  She has a sensation in her left breast where the cancer is.  She says she experiences sharp action and isn't sure if that is the cancer going away.    REVIEW OF SYSTEMS: Tanya [name] has developed hyperpigmentation in her hands.  She does have some nausea that she controls on her own and doesn't take anti-emetics.  This is working for her.  She denies fevers, chills, vomiting, constipation, diarrhea, mucositis, or any other concerns.  A detailed ROS is otherwise non contributory.   PAST MEDICAL HISTORY: Past Medical History:  Diagnosis Date  . ANXIETY   . Dyspnea on exertion   . HYPERLIPIDEMIA   . HYPERTENSION, BENIGN ESSENTIAL   . Malignant neoplasm of upper-outer quadrant of left breast in female, estrogen receptor positive ([place]) 05/04/2016  . OBESITY     PAST SURGICAL HISTORY: Past Surgical History:  Procedure Laterality Date  . CESAREAN SECTION    . PORTACATH PLACEMENT Right 06/02/2016   Procedure: INSERTION PORT-A-CATH WITH ULTRASOUND GUIDANCE;  Surgeon: Rolm Bookbinder, MD;  Location: [facility];  Service: General;  Laterality: Right;  . TUBAL LIGATION      FAMILY HISTORY Family History  Problem Relation Age of Onset  . Breast cancer Mother 40  The patient's father had a history of prostate cancer. He died at age 50. The patient's mother died at age 68 from unclear causes. She had a history of breast cancer diagnosed in her 76s. The patient had 4 brothers, 2 sisters. There is no other history of breast or ovarian  cancer in the family to the patient's knowledge  GYNECOLOGIC HISTORY:  No LMP recorded. Patient is postmenopausal. Menarche age 76, first live birth age 62, the patient is [place] P4. She stopped having periods approximately 2006. She did not use hormone replacement. She used oral contraceptives briefly and remotely, with no  complications.  SOCIAL HISTORY:  The patient is originally from [place]. 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 The patient's husband Tanya [name] was also a professor at [employer], where he taught agriculture oh research. Son Homero Fellers works in Office manager, son Merton Border works for [employer], daughter Misty Stanley is a Child psychotherapist, and daughter Westley Gambles is also a Child psychotherapist, all living in [place]. The patient has 3 grandchildren. She attends [employer]    ADVANCED DIRECTIVES: Not in place   HEALTH MAINTENANCE: Social History  Substance Use Topics  . Smoking status: Never Smoker  . Smokeless tobacco: Never Used  . Alcohol use Yes     Comment: rarely     Colonoscopy:Never  PAP: November 2017  Bone density: Never   Allergies  Allergen Reactions  . Camoquin [Amodiaquine] Itching    Current Outpatient Prescriptions  Medication Sig Dispense Refill  . lidocaine-prilocaine (EMLA) cream Apply to affected area once 30 g 3  . loratadine (CLARITIN) 10 MG tablet Take 1 tablet (10 mg total) by mouth daily. (Patient taking differently: Take 10 mg by mouth daily. Takes with neulasta) 30 tablet 3  . LORazepam (ATIVAN) 0.5 MG tablet Take 1 tablet (0.5 mg total) by mouth at bedtime as needed (Nausea or vomiting). 30 tablet 0  . losartan (COZAAR) 50 MG tablet Take 1 tablet (50 mg total) by mouth daily. 90 tablet 2  . omeprazole (PRILOSEC) 40 MG capsule Take 1 capsule (40 mg total) by mouth daily. 90 capsule 3  . pravastatin (PRAVACHOL) 40 MG tablet Take 1 tablet (40 mg total) by mouth at bedtime. 90 tablet 2  . prochlorperazine (COMPAZINE) 10 MG tablet Take 1 tablet (10 mg total) by mouth every 6 (six) hours as needed (Nausea or vomiting). 30 tablet 1  . simethicone (MYLICON) 80 MG chewable tablet Chew 80 mg by mouth every 6 (six) hours as needed.     No current facility-administered medications for this visit.     OBJECTIVE:   Vitals:   08/11/16 0908  BP: 137/89  Pulse: 88    Resp: 18  Temp: 98.7 F (37.1 C)     Body mass index is 32.84 kg/m.    ECOG FS:1 - Symptomatic but completely ambulatory GENERAL: Patient is a well appearing female in no acute distress HEENT:  Sclerae anicteric.  Oropharynx clear and moist. No ulcerations or evidence of oropharyngeal candidiasis. Neck is supple.  NODES:  No cervical, supraclavicular, or axillary lymphadenopathy palpated.  BREAST EXAM:  No mass noted LUNGS:  Clear to auscultation bilaterally.  No wheezes or rhonchi. HEART:  Regular rate and rhythm. No murmur appreciated. ABDOMEN:  Soft, nontender.  Positive, normoactive bowel sounds. No organomegaly palpated. MSK:  No focal spinal tenderness to palpation. Full range of motion bilaterally in the upper extremities. EXTREMITIES:  No peripheral edema.   SKIN:  Clear with no obvious rashes or skin changes. No nail dyscrasia. NEURO:  Nonfocal. Well oriented.  Appropriate affect.      LAB RESULTS:  CMP     Component Value Date/Time   NA 143 07/21/2016 1008   K 4.2 07/21/2016 1008   CL 102 01/26/2016 1629   CO2 27 07/21/2016  1008   GLUCOSE 176 (H) 07/21/2016 1008   BUN 10.0 07/21/2016 1008   CREATININE 0.8 07/21/2016 1008   CALCIUM 9.2 07/21/2016 1008   PROT 6.6 07/21/2016 1008   ALBUMIN 3.7 07/21/2016 1008   AST 14 07/21/2016 1008   ALT 18 07/21/2016 1008   ALKPHOS 97 07/21/2016 1008   BILITOT 0.39 07/21/2016 1008    INo results found for: SPEP, UPEP  CBC    Component Value Date/Time   WBC 5.4 08/11/2016 0852   WBC 7.6 01/23/2014 1431   RBC 4.35 08/11/2016 0852   RBC 5.41 (H) 01/23/2014 1431   HGB 11.7 08/11/2016 0852   HCT 35.9 08/11/2016 0852   PLT 292 08/11/2016 0852   MCV 82.7 08/11/2016 0852   MCH 27.0 08/11/2016 0852   MCH 25.0 (L) 01/23/2014 1431   MCHC 32.6 08/11/2016 0852   MCHC 33.8 01/23/2014 1431   RDW 20.5 (H) 08/11/2016 0852   LYMPHSABS 1.2 08/11/2016 0852   MONOABS 0.5 08/11/2016 0852   EOSABS 0.0 08/11/2016 0852   BASOSABS 0.0  08/11/2016 0852        Chemistry      Component Value Date/Time   NA 143 07/21/2016 1008   K 4.2 07/21/2016 1008   CL 102 01/26/2016 1629   CO2 27 07/21/2016 1008   BUN 10.0 07/21/2016 1008   CREATININE 0.8 07/21/2016 1008      Component Value Date/Time   CALCIUM 9.2 07/21/2016 1008   ALKPHOS 97 07/21/2016 1008   AST 14 07/21/2016 1008   ALT 18 07/21/2016 1008   BILITOT 0.39 07/21/2016 1008       No results found for: LABCA2  No components found for: LABCA125  No results for input(s): INR in the last 168 hours.  Urinalysis    Component Value Date/Time   COLORURINE yellow 06/01/2006 1116   APPEARANCEUR Clear 06/01/2006 1116   LABSPEC 1.010 06/01/2006 1116   PHURINE 6.5 06/01/2006 1116   HGBUR negative 06/01/2006 1116   BILIRUBINUR negative 06/01/2006 1116   UROBILINOGEN 0.2 06/01/2006 1116   NITRITE negative 06/01/2006 1116     STUDIES: No results found.  ELIGIBLE FOR AVAILABLE RESEARCH PROTOCOL: Not a candidate for Prevent (already on a statin drug)  ASSESSMENT: 61 y.o. [place] woman status post left breast upper inner quadrant and left axillary lymph node biopsy every 16 2018, both showing invasive ductal carcinoma, grade 3, clinically T1c N1a, or stage IIB, estrogen receptor positive, progesterone receptor and HER-2 negative, with an MIB-1 of 40%  (a) MRI biopsy 05/25/2016 of a second area of concern in the left breast also shows invasive ductal carcinoma  (1) neoadjuvant anastrozole started 05/11/2016, held at the start of chemotherapy  (2) Mammaprint results shows the tumor to be luminal B, high risk, indicating a need for chemotherapy.  (3) chemotherapy will consist of doxorubicin and cyclophosphamide in dose dense fashion 4 starting 06/09/2016, completed 07/21/2016 to be followed by weekly paclitaxel 12  (4) breast conserving surgery with targeted axillary dissection to follow  (5) adjuvant radiation as appropriate  (6) adjuvant anti-estrogens  to be continued a minimum of 5 years  PLAN: Crosby is doing well today and will proceed with chemotherapy.  I reviewed the Paclitaxel with her today in detail, especially the possibility of peripheral neuropathy.  I reviewed her CBC with her and her husband in detail.  She will return next week for labs, an appointment with Dr. Jana Hakim, and her second week of chemotherapy.    She knows  to call for any problems that may develop before the next visit here.  A total of (30) minutes of face-to-face time was spent with this patient with greater than 50% of that time in counseling and care-coordination.  Scot Dock, NP   08/11/2016 9:13 AM Medical Oncology and Hematology [facility] [address] [place], [place] [address] Tel. [phone]    Fax. [phone]

## 2016-08-11 ENCOUNTER — Other Ambulatory Visit (HOSPITAL_BASED_OUTPATIENT_CLINIC_OR_DEPARTMENT_OTHER): Payer: BLUE CROSS/BLUE SHIELD

## 2016-08-11 ENCOUNTER — Encounter: Payer: Self-pay | Admitting: *Deleted

## 2016-08-11 ENCOUNTER — Ambulatory Visit (HOSPITAL_BASED_OUTPATIENT_CLINIC_OR_DEPARTMENT_OTHER): Payer: BLUE CROSS/BLUE SHIELD

## 2016-08-11 ENCOUNTER — Ambulatory Visit (HOSPITAL_BASED_OUTPATIENT_CLINIC_OR_DEPARTMENT_OTHER): Payer: BLUE CROSS/BLUE SHIELD | Admitting: Adult Health

## 2016-08-11 ENCOUNTER — Encounter: Payer: Self-pay | Admitting: Adult Health

## 2016-08-11 VITALS — BP 160/95 | HR 71 | Temp 97.7°F | Resp 18

## 2016-08-11 VITALS — BP 137/89 | HR 88 | Temp 98.7°F | Resp 18 | Ht 63.0 in | Wt 185.4 lb

## 2016-08-11 DIAGNOSIS — C773 Secondary and unspecified malignant neoplasm of axilla and upper limb lymph nodes: Secondary | ICD-10-CM

## 2016-08-11 DIAGNOSIS — Z170 Estrogen receptor positive status [ER+]: Secondary | ICD-10-CM

## 2016-08-11 DIAGNOSIS — Z5111 Encounter for antineoplastic chemotherapy: Secondary | ICD-10-CM

## 2016-08-11 DIAGNOSIS — C50412 Malignant neoplasm of upper-outer quadrant of left female breast: Secondary | ICD-10-CM

## 2016-08-11 LAB — CBC WITH DIFFERENTIAL/PLATELET
BASO%: 0.2 % (ref 0.0–2.0)
Basophils Absolute: 0 10*3/uL (ref 0.0–0.1)
EOS%: 0.7 % (ref 0.0–7.0)
Eosinophils Absolute: 0 10*3/uL (ref 0.0–0.5)
HEMATOCRIT: 35.9 % (ref 34.8–46.6)
HEMOGLOBIN: 11.7 g/dL (ref 11.6–15.9)
LYMPH#: 1.2 10*3/uL (ref 0.9–3.3)
LYMPH%: 21.7 % (ref 14.0–49.7)
MCH: 27 pg (ref 25.1–34.0)
MCHC: 32.6 g/dL (ref 31.5–36.0)
MCV: 82.7 fL (ref 79.5–101.0)
MONO#: 0.5 10*3/uL (ref 0.1–0.9)
MONO%: 9.7 % (ref 0.0–14.0)
NEUT#: 3.6 10*3/uL (ref 1.5–6.5)
NEUT%: 67.7 % (ref 38.4–76.8)
PLATELETS: 292 10*3/uL (ref 145–400)
RBC: 4.35 10*6/uL (ref 3.70–5.45)
RDW: 20.5 % — AB (ref 11.2–14.5)
WBC: 5.4 10*3/uL (ref 3.9–10.3)

## 2016-08-11 LAB — COMPREHENSIVE METABOLIC PANEL
ALBUMIN: 3.6 g/dL (ref 3.5–5.0)
ALK PHOS: 78 U/L (ref 40–150)
ALT: 17 U/L (ref 0–55)
ANION GAP: 11 meq/L (ref 3–11)
AST: 18 U/L (ref 5–34)
BUN: 6.9 mg/dL — ABNORMAL LOW (ref 7.0–26.0)
CO2: 25 meq/L (ref 22–29)
Calcium: 9.1 mg/dL (ref 8.4–10.4)
Chloride: 110 mEq/L — ABNORMAL HIGH (ref 98–109)
Creatinine: 0.8 mg/dL (ref 0.6–1.1)
GLUCOSE: 156 mg/dL — AB (ref 70–140)
POTASSIUM: 3.9 meq/L (ref 3.5–5.1)
SODIUM: 146 meq/L — AB (ref 136–145)
Total Bilirubin: 0.41 mg/dL (ref 0.20–1.20)
Total Protein: 6.4 g/dL (ref 6.4–8.3)

## 2016-08-11 MED ORDER — FAMOTIDINE IN NACL 20-0.9 MG/50ML-% IV SOLN
20.0000 mg | Freq: Once | INTRAVENOUS | Status: AC
  Administered 2016-08-11: 20 mg via INTRAVENOUS

## 2016-08-11 MED ORDER — DIPHENHYDRAMINE HCL 50 MG/ML IJ SOLN
25.0000 mg | Freq: Once | INTRAMUSCULAR | Status: AC
  Administered 2016-08-11: 25 mg via INTRAVENOUS

## 2016-08-11 MED ORDER — PACLITAXEL CHEMO INJECTION 300 MG/50ML
80.0000 mg/m2 | Freq: Once | INTRAVENOUS | Status: AC
  Administered 2016-08-11: 162 mg via INTRAVENOUS
  Filled 2016-08-11: qty 27

## 2016-08-11 MED ORDER — SODIUM CHLORIDE 0.9 % IV SOLN
Freq: Once | INTRAVENOUS | Status: AC
  Administered 2016-08-11: 10:00:00 via INTRAVENOUS

## 2016-08-11 MED ORDER — SODIUM CHLORIDE 0.9% FLUSH
10.0000 mL | INTRAVENOUS | Status: DC | PRN
Start: 2016-08-11 — End: 2016-08-11
  Administered 2016-08-11: 10 mL
  Filled 2016-08-11: qty 10

## 2016-08-11 MED ORDER — HEPARIN SOD (PORK) LOCK FLUSH 100 UNIT/ML IV SOLN
500.0000 [IU] | Freq: Once | INTRAVENOUS | Status: AC | PRN
  Administered 2016-08-11: 500 [IU]
  Filled 2016-08-11: qty 5

## 2016-08-11 MED ORDER — SODIUM CHLORIDE 0.9 % IV SOLN
20.0000 mg | Freq: Once | INTRAVENOUS | Status: AC
  Administered 2016-08-11: 20 mg via INTRAVENOUS
  Filled 2016-08-11: qty 2

## 2016-08-11 MED ORDER — DIPHENHYDRAMINE HCL 50 MG/ML IJ SOLN
INTRAMUSCULAR | Status: AC
  Filled 2016-08-11: qty 1

## 2016-08-11 MED ORDER — FAMOTIDINE IN NACL 20-0.9 MG/50ML-% IV SOLN
INTRAVENOUS | Status: AC
  Filled 2016-08-11: qty 50

## 2016-08-11 NOTE — Progress Notes (Signed)
Tanya Massed, Tanya [name] notified on BP of 168/98. Patient up to bathroom and instructed by Mendel Ryder to recheck after she is back to chair and call if higher. Bp rechecked 168/ 96.

## 2016-08-11 NOTE — Progress Notes (Signed)
Patient took blood pressure medication from home before being discharged home,

## 2016-08-11 NOTE — Patient Instructions (Signed)
[facility] Discharge Instructions for Patients Receiving Chemotherapy  Today you received the following chemotherapy agents Taxol   To help prevent nausea and vomiting after your treatment, we encourage you to take your nausea medication as directed.   If you develop nausea and vomiting that is not controlled by your nausea medication, call the clinic.   BELOW ARE SYMPTOMS THAT SHOULD BE REPORTED IMMEDIATELY:  *FEVER GREATER THAN 100.5 F  *CHILLS WITH OR WITHOUT FEVER  NAUSEA AND VOMITING THAT IS NOT CONTROLLED WITH YOUR NAUSEA MEDICATION  *UNUSUAL SHORTNESS OF BREATH  *UNUSUAL BRUISING OR BLEEDING  TENDERNESS IN MOUTH AND THROAT WITH OR WITHOUT PRESENCE OF ULCERS  *URINARY PROBLEMS  *BOWEL PROBLEMS  UNUSUAL RASH Items with * indicate a potential emergency and should be followed up as soon as possible.  Feel free to call the clinic you have any questions or concerns. The clinic phone number is [phone].  Please show the CHEMO ALERT CARD at check-in to the Emergency Department and triage nurse.   

## 2016-08-13 NOTE — Addendum Note (Signed)
Addendum  created 08/13/16 1040 by Duane Boston, MD   Sign clinical note

## 2016-08-15 ENCOUNTER — Telehealth: Payer: Self-pay | Admitting: *Deleted

## 2016-08-15 NOTE — Telephone Encounter (Signed)
"  I need to talk to Dr. Jana Hakim or his nurse.  Yesterday I did not take the B/P medicine because the b/p was too low.  Taxol was started 08-11-2016.  I started having low B/P so Dr. Jana Hakim told me to take half or 25 mg of the Cozaar.  Yesterday B/P at 10:30 am = 97/79.  I went t the [employer], walked around for two hours.  I did get in the shade at times.  I don't think I was sweating too much.  Checked my b/p after this event = 78/57 P = 108.  The nurse yesterday told me to drink water with salt and call Dr. Jana Hakim today.  No dizziness, weakness, vomiting or diarrhea.  I checked my b/p throughout the day yesterday.  Within ten minutes my B/P = 104/80   P = 106.  I checked every thirty minutes.     B/P = 108/78, P = 107 B/P = 100/78, P = 103 B/P = 103/73, P =  97 B/P = 136/97 B/P = 127/93 B/P = 142/97 at bedtime B/P this morning = 137/96, P = 93. I usually average 500 to 164 systolically and diastolic averages 80 to 90.  What does he recommend I do for my blood pressure.  Return number [phone]."  Will notify provider.  Nurse instructions today are to continue to force fluids with a decrease or elimination of the salt.  Avoid sun and heat without wearing hat and shades.

## 2016-08-15 NOTE — Telephone Encounter (Signed)
Reviewed pt's call today as well as call per On Call services from 08/13/2016.  Per MD pt should resume the losartan at 25 mg and as stated by on call MD - pt should take in the evening.  Discussed above with pt who verbalized understanding.  Chenika will continue to monitor BP and call if concerned.

## 2016-08-18 ENCOUNTER — Ambulatory Visit (HOSPITAL_BASED_OUTPATIENT_CLINIC_OR_DEPARTMENT_OTHER): Payer: BLUE CROSS/BLUE SHIELD

## 2016-08-18 ENCOUNTER — Ambulatory Visit: Payer: BLUE CROSS/BLUE SHIELD

## 2016-08-18 ENCOUNTER — Other Ambulatory Visit (HOSPITAL_BASED_OUTPATIENT_CLINIC_OR_DEPARTMENT_OTHER): Payer: BLUE CROSS/BLUE SHIELD

## 2016-08-18 ENCOUNTER — Ambulatory Visit (HOSPITAL_BASED_OUTPATIENT_CLINIC_OR_DEPARTMENT_OTHER): Payer: BLUE CROSS/BLUE SHIELD | Admitting: Oncology

## 2016-08-18 VITALS — HR 95

## 2016-08-18 VITALS — BP 154/89 | HR 103 | Temp 99.1°F | Resp 18 | Ht 62.0 in | Wt 185.0 lb

## 2016-08-18 DIAGNOSIS — Z5111 Encounter for antineoplastic chemotherapy: Secondary | ICD-10-CM

## 2016-08-18 DIAGNOSIS — Z170 Estrogen receptor positive status [ER+]: Principal | ICD-10-CM

## 2016-08-18 DIAGNOSIS — C50412 Malignant neoplasm of upper-outer quadrant of left female breast: Secondary | ICD-10-CM

## 2016-08-18 DIAGNOSIS — C773 Secondary and unspecified malignant neoplasm of axilla and upper limb lymph nodes: Secondary | ICD-10-CM

## 2016-08-18 LAB — URINALYSIS, MICROSCOPIC - CHCC
BACTERIA UA: NEGATIVE
Bilirubin (Urine): NEGATIVE
Blood: NEGATIVE
GLUCOSE UR CHCC: NEGATIVE mg/dL
KETONES: NEGATIVE mg/dL
LEUKOCYTE ESTERASE: NEGATIVE
Nitrite: NEGATIVE
PH: 6.5 (ref 4.6–8.0)
PROTEIN: NEGATIVE mg/dL
SPECIFIC GRAVITY, URINE: 1.01 (ref 1.003–1.035)
UROBILINOGEN UR: 0.2 mg/dL (ref 0.2–1)

## 2016-08-18 LAB — COMPREHENSIVE METABOLIC PANEL
ALBUMIN: 3.7 g/dL (ref 3.5–5.0)
ALK PHOS: 75 U/L (ref 40–150)
ALT: 17 U/L (ref 0–55)
ANION GAP: 8 meq/L (ref 3–11)
AST: 16 U/L (ref 5–34)
BUN: 10.4 mg/dL (ref 7.0–26.0)
CO2: 27 meq/L (ref 22–29)
Calcium: 9.2 mg/dL (ref 8.4–10.4)
Chloride: 106 mEq/L (ref 98–109)
Creatinine: 0.8 mg/dL (ref 0.6–1.1)
GLUCOSE: 143 mg/dL — AB (ref 70–140)
Potassium: 4.2 mEq/L (ref 3.5–5.1)
SODIUM: 141 meq/L (ref 136–145)
Total Bilirubin: 0.54 mg/dL (ref 0.20–1.20)
Total Protein: 6.6 g/dL (ref 6.4–8.3)

## 2016-08-18 LAB — CBC WITH DIFFERENTIAL/PLATELET
BASO%: 0.2 % (ref 0.0–2.0)
Basophils Absolute: 0 10*3/uL (ref 0.0–0.1)
EOS%: 1.9 % (ref 0.0–7.0)
Eosinophils Absolute: 0.1 10*3/uL (ref 0.0–0.5)
HEMATOCRIT: 34.9 % (ref 34.8–46.6)
HEMOGLOBIN: 11.6 g/dL (ref 11.6–15.9)
LYMPH#: 1.7 10*3/uL (ref 0.9–3.3)
LYMPH%: 25.9 % (ref 14.0–49.7)
MCH: 27 pg (ref 25.1–34.0)
MCHC: 33.1 g/dL (ref 31.5–36.0)
MCV: 81.6 fL (ref 79.5–101.0)
MONO#: 0.4 10*3/uL (ref 0.1–0.9)
MONO%: 6.9 % (ref 0.0–14.0)
NEUT%: 65.1 % (ref 38.4–76.8)
NEUTROS ABS: 4.2 10*3/uL (ref 1.5–6.5)
PLATELETS: 251 10*3/uL (ref 145–400)
RBC: 4.28 10*6/uL (ref 3.70–5.45)
RDW: 19.5 % — ABNORMAL HIGH (ref 11.2–14.5)
WBC: 6.4 10*3/uL (ref 3.9–10.3)

## 2016-08-18 MED ORDER — FAMOTIDINE IN NACL 20-0.9 MG/50ML-% IV SOLN
20.0000 mg | Freq: Once | INTRAVENOUS | Status: AC
  Administered 2016-08-18: 20 mg via INTRAVENOUS

## 2016-08-18 MED ORDER — DEXAMETHASONE SODIUM PHOSPHATE 10 MG/ML IJ SOLN
INTRAMUSCULAR | Status: AC
Start: 2016-08-18 — End: 2016-08-18
  Filled 2016-08-18: qty 1

## 2016-08-18 MED ORDER — DIPHENHYDRAMINE HCL 50 MG/ML IJ SOLN
12.5000 mg | Freq: Once | INTRAMUSCULAR | Status: AC
  Administered 2016-08-18: 12.5 mg via INTRAVENOUS

## 2016-08-18 MED ORDER — SODIUM CHLORIDE 0.9 % IV SOLN
Freq: Once | INTRAVENOUS | Status: AC
Start: 2016-08-18 — End: 2016-08-18
  Administered 2016-08-18: 13:00:00 via INTRAVENOUS

## 2016-08-18 MED ORDER — HEPARIN SOD (PORK) LOCK FLUSH 100 UNIT/ML IV SOLN
500.0000 [IU] | Freq: Once | INTRAVENOUS | Status: AC | PRN
  Administered 2016-08-18: 500 [IU]
  Filled 2016-08-18: qty 5

## 2016-08-18 MED ORDER — SODIUM CHLORIDE 0.9% FLUSH
10.0000 mL | INTRAVENOUS | Status: DC | PRN
  Administered 2016-08-18: 10 mL
  Filled 2016-08-18: qty 10

## 2016-08-18 MED ORDER — FAMOTIDINE IN NACL 20-0.9 MG/50ML-% IV SOLN
INTRAVENOUS | Status: AC
  Filled 2016-08-18: qty 50

## 2016-08-18 MED ORDER — SODIUM CHLORIDE 0.9 % IV SOLN: 10.0000 mg | Freq: Once | INTRAVENOUS | Status: DC

## 2016-08-18 MED ORDER — DIPHENHYDRAMINE HCL 50 MG/ML IJ SOLN
INTRAMUSCULAR | Status: AC
  Filled 2016-08-18: qty 1

## 2016-08-18 MED ORDER — PACLITAXEL CHEMO INJECTION 300 MG/50ML
80.0000 mg/m2 | Freq: Once | INTRAVENOUS | Status: AC
  Administered 2016-08-18: 162 mg via INTRAVENOUS
  Filled 2016-08-18: qty 27

## 2016-08-18 MED ORDER — DEXAMETHASONE SODIUM PHOSPHATE 10 MG/ML IJ SOLN
10.0000 mg | Freq: Once | INTRAMUSCULAR | Status: AC
  Administered 2016-08-18: 10 mg via INTRAVENOUS

## 2016-08-18 NOTE — Progress Notes (Signed)
[place]  Telephone:([phone] Fax:(336) [phone]     ID: Tanya [name] DOB: 1955/08/22  MR#: [id number]  TDD#:[id number]  Patient Care Team: Everrett Coombe, MD as PCP - General Rolm Bookbinder, MD as Consulting Physician (General Surgery) Magrinat, Virgie Dad, MD as Consulting Physician (Oncology) Kyung Rudd, MD as Consulting Physician (Radiation Oncology) Chauncey Cruel, MD OTHER MD:  CHIEF COMPLAINT: Estrogen receptor positive breast cancer  CURRENT TREATMENT: Neoadjuvant chemotherapy   BREAST CANCER HISTORY: From the original intake note:  Tanya [name] had routine bilateral screening mammography at the [facility] 04/19/2016. This showed a possible area of the symmetry in the left breast and a prominent left axillary lymph nodes. On 04/28/2016 she underwent diagnostic left mammography with tomography and left breast ultrasonography. This showed the breast density to be category B. There was an obscured mass in the upper outer left breast and an enlarged left axillary lymph node. On exam there was focal thickening at the 2:00 position of the left breast 9 cm from the nipple. Targeted ultrasonography confirmed a 1.8 cm hypoechoic mass with a second adjacent 1.1 cm mass both at the 2:00 position of the left breast 9 cm from the nipple. The conglomerate area measured 3.5 cm. There was also an enlarged left axillary lymph node with loss of fatty hilum measuring 1.8 cm.  On family 16 2018 the patient underwent biopsy of the larger left breast mass in question and the suspicious left axillary lymph node. 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  The patient's subsequent history is as detailed below  INTERVAL HISTORY: Tanya [name] returns today for  follow-up of her estrogen receptor positive breast cancer accompanied by her husband. Today is day 1 cycle 2 of 12 weekly cycles of paclitaxel planned.  She did remarkably well with the first cycle, with no nausea, vomiting, peripheral neuropathy, mouth sores or other side effects that she is aware of. Her port worked well.   REVIEW OF SYSTEMS: She has some hyperpigmentation and understands this may get worse with the current treatment. She has been getting very variable blood pressure readings at home including some systolics in the 62B, and quite a few diastolic in the 76E. She is not sure how to take her blood pressure medicine. She tells me she is urinating a lot more than she usually does. She wonders if she has an infection. These are not little amounts but fairly large amounts. She is compensating by drinking more fluid and this may be what's going on. She is very concerned about the possibility of an infection however. Aside from these issues a detailed review of systems today was stable  PAST MEDICAL HISTORY: Past Medical History:  Diagnosis Date  . ANXIETY   . Dyspnea on exertion   . HYPERLIPIDEMIA   . HYPERTENSION, BENIGN ESSENTIAL   . Malignant neoplasm of upper-outer quadrant of left breast in female, estrogen receptor positive ([place]) 05/04/2016  . OBESITY     PAST SURGICAL HISTORY: Past Surgical History:  Procedure Laterality Date  . CESAREAN SECTION    . PORTACATH PLACEMENT Right 06/02/2016   Procedure: INSERTION PORT-A-CATH WITH ULTRASOUND GUIDANCE;  Surgeon: Rolm Bookbinder, MD;  Location: [facility];  Service: General;  Laterality: Right;  . TUBAL LIGATION      FAMILY HISTORY Family History  Problem  Relation Age of Onset  . Breast cancer Mother 76  The patient's father had a history of prostate cancer. He died at age 66. The patient's mother died at age 33 from unclear causes. She had a history of breast cancer diagnosed in her 52s. The patient had 4  brothers, 2 sisters. There is no other history of breast or ovarian cancer in the family to the patient's knowledge  GYNECOLOGIC HISTORY:  No LMP recorded. Patient is postmenopausal. Menarche age 25, first live birth age 65, the patient is [name] P4. She stopped having periods approximately 2006. She did not use hormone replacement. She used oral contraceptives briefly and remotely, with no complications.  SOCIAL HISTORY:  The patient is originally from [name]. 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 The patient's husband Tanya [name] was also a professor at [employer], where he taught agriculture oh research. Son Tanya [name] works in Land, son Tanya [name] works for [employer], daughter Tanya [name] is a Education officer, museum, and daughter Tanya [name] is also a Education officer, museum, all living in [place]. The patient has 3 grandchildren. She attends [employer]    ADVANCED DIRECTIVES: Not in place   HEALTH MAINTENANCE: Social History  Substance Use Topics  . Smoking status: Never Smoker  . Smokeless tobacco: Never Used  . Alcohol use Yes     Comment: rarely     Colonoscopy:Never  PAP: November 2017  Bone density: Never   Allergies  Allergen Reactions  . Camoquin [Amodiaquine] Itching    Current Outpatient Prescriptions  Medication Sig Dispense Refill  . lidocaine-prilocaine (EMLA) cream Apply to affected area once 30 g 3  . losartan (COZAAR) 50 MG tablet Take 0.5 tablets (25 mg total) by mouth daily. 90 tablet 2  . omeprazole (PRILOSEC) 40 MG capsule Take 1 capsule (40 mg total) by mouth daily. 90 capsule 3  . pravastatin (PRAVACHOL) 40 MG tablet Take 1 tablet (40 mg total) by mouth at bedtime. 90 tablet 2  . prochlorperazine (COMPAZINE) 10 MG tablet Take 1 tablet (10 mg total) by mouth every 6 (six) hours as needed (Nausea or vomiting). 30 tablet 1  . simethicone (MYLICON) 80 MG chewable tablet Chew 80 mg by mouth every 6 (six) hours as needed.     No current facility-administered  medications for this visit.     OBJECTIVE: Middle-aged African-American woman who appears stated age  44:   08/18/16 1133  BP: (!) 154/89  Pulse: (!) 103  Resp: 18  Temp: 99.1 F (37.3 C)     Body mass index is 33.84 kg/m.    ECOG FS:1 - Symptomatic but completely ambulatory  Sclerae unicteric, EOMs intact Oropharynx clear and moist No cervical or supraclavicular adenopathy Lungs no rales or rhonchi Heart regular rate and rhythm Abd soft, nontender, positive bowel sounds MSK no focal spinal tenderness, no upper extremity lymphedema Neuro: nonfocal, well oriented, appropriate affect Breasts: The right breast is unremarkable. I do not palpate a mass in the left breast. There are no skin or nipple changes of concern. Both axillae are benign.  LAB RESULTS:  CMP     Component Value Date/Time   NA 141 08/18/2016 1112   K 4.2 08/18/2016 1112   CL 102 01/26/2016 1629   CO2 27 08/18/2016 1112   GLUCOSE 143 (H) 08/18/2016 1112   BUN 10.4 08/18/2016 1112   CREATININE 0.8 08/18/2016 1112   CALCIUM 9.2 08/18/2016 1112   PROT 6.6 08/18/2016 1112   ALBUMIN 3.7 08/18/2016 1112   AST  16 08/18/2016 1112   ALT 17 08/18/2016 1112   ALKPHOS 75 08/18/2016 1112   BILITOT 0.54 08/18/2016 1112    INo results found for: SPEP, UPEP  CBC    Component Value Date/Time   WBC 6.4 08/18/2016 1113   WBC 7.6 01/23/2014 1431   RBC 4.28 08/18/2016 1113   RBC 5.41 (H) 01/23/2014 1431   HGB 11.6 08/18/2016 1113   HCT 34.9 08/18/2016 1113   PLT 251 08/18/2016 1113   MCV 81.6 08/18/2016 1113   MCH 27.0 08/18/2016 1113   MCH 25.0 (L) 01/23/2014 1431   MCHC 33.1 08/18/2016 1113   MCHC 33.8 01/23/2014 1431   RDW 19.5 (H) 08/18/2016 1113   LYMPHSABS 1.7 08/18/2016 1113   MONOABS 0.4 08/18/2016 1113   EOSABS 0.1 08/18/2016 1113   BASOSABS 0.0 08/18/2016 1113        Chemistry      Component Value Date/Time   NA 141 08/18/2016 1112   K 4.2 08/18/2016 1112   CL 102 01/26/2016 1629    CO2 27 08/18/2016 1112   BUN 10.4 08/18/2016 1112   CREATININE 0.8 08/18/2016 1112      Component Value Date/Time   CALCIUM 9.2 08/18/2016 1112   ALKPHOS 75 08/18/2016 1112   AST 16 08/18/2016 1112   ALT 17 08/18/2016 1112   BILITOT 0.54 08/18/2016 1112       No results found for: LABCA2  No components found for: LABCA125  No results for input(s): INR in the last 168 hours.  Urinalysis    Component Value Date/Time   COLORURINE yellow 06/01/2006 1116   APPEARANCEUR Clear 06/01/2006 1116   LABSPEC 1.010 06/01/2006 1116   PHURINE 6.5 06/01/2006 1116   HGBUR negative 06/01/2006 1116   BILIRUBINUR negative 06/01/2006 1116   UROBILINOGEN 0.2 06/01/2006 1116   NITRITE negative 06/01/2006 1116     STUDIES: No results found.  ELIGIBLE FOR AVAILABLE RESEARCH PROTOCOL: Not a candidate for Prevent (already on a statin drug)  ASSESSMENT: 61 y.o. [place] woman status post left breast upper inner quadrant and left axillary lymph node biopsy every 16 2018, both showing invasive ductal carcinoma, grade 3, clinically T1c N1a, or stage IIB, estrogen receptor positive, progesterone receptor and HER-2 negative, with an MIB-1 of 40%  (a) MRI biopsy 05/25/2016 of a second area of concern in the left breast also shows invasive ductal carcinoma  (1) neoadjuvant anastrozole started 05/11/2016, held at the start of chemotherapy  (2) Mammaprint results shows the tumor to be luminal B, high risk, indicating a need for chemotherapy.  (3) chemotherapy will consist of doxorubicin and cyclophosphamide in dose dense fashion 4 starting 06/09/2016, completed 07/21/2016, followed by weekly paclitaxel 12 started 08/11/2016  (4) breast conserving surgery with targeted axillary dissection to follow  (5) adjuvant radiation as appropriate  (6) adjuvant anti-estrogens to be continued a minimum of 5 years  PLAN: I spent approximately 30 minutes with Tanya [name] with most of that time spent discussing  her multiple problems. From the point of view of breast cancer, she tolerated her first paclitaxel dose well. The big problem she had was drowsiness from the Benadryl. I am dropping the Benadryl dose further (I had a ready dropped it to 25 mg from baseline 50). The good news is that this tells [place] if she has insomnia problems she can take Benadryl at bedtime and that should work well for her.   She is very concerned about her blood pressure and it is difficult to make  sense of because the blood pressure as we get here are on the high side, there once she gets at home on the low side. We have dropped her losartan dose to 25 mg at bedtime. Today we decided if her systolic at home is less than 90 she will not take her losartan but otherwise she will take it. It is not clear whether her home blood pressure machine, or hours here, or both needs to be recalibrated.  She is urinating a lot more than she is used to she says. Her weight is completely stable. We are going to obtain a urinalysis today to make sure everything looks fine.  Otherwise we are proceeding with her second cycle of Taxol today. I'm going to start seeing her on an every other treatment basis but I have urged her to let us know if she develops any numbness or tingling in her finger pads or toe pads.  It is very favorable that there is no palpable mass in the affected breast. I am expecting a good surgical result when she gets to her definitive surgery  She knows to call for any problems that may develop before her next visit here.   Marland Kitchen  Chauncey Cruel 08/18/2016 Medical Oncology and Hematology [facility] [address] [place], [place] [address] Tel. [phone]    Fax. [phone]

## 2016-08-18 NOTE — Patient Instructions (Signed)
[facility] Discharge Instructions for Patients Receiving Chemotherapy  Today you received the following chemotherapy agents Taxol   To help prevent nausea and vomiting after your treatment, we encourage you to take your nausea medication as directed.   If you develop nausea and vomiting that is not controlled by your nausea medication, call the clinic.   BELOW ARE SYMPTOMS THAT SHOULD BE REPORTED IMMEDIATELY:  *FEVER GREATER THAN 100.5 F  *CHILLS WITH OR WITHOUT FEVER  NAUSEA AND VOMITING THAT IS NOT CONTROLLED WITH YOUR NAUSEA MEDICATION  *UNUSUAL SHORTNESS OF BREATH  *UNUSUAL BRUISING OR BLEEDING  TENDERNESS IN MOUTH AND THROAT WITH OR WITHOUT PRESENCE OF ULCERS  *URINARY PROBLEMS  *BOWEL PROBLEMS  UNUSUAL RASH Items with * indicate a potential emergency and should be followed up as soon as possible.  Feel free to call the clinic you have any questions or concerns. The clinic phone number is [phone].  Please show the CHEMO ALERT CARD at check-in to the Emergency Department and triage nurse.   

## 2016-08-22 ENCOUNTER — Telehealth: Payer: Self-pay

## 2016-08-22 NOTE — Telephone Encounter (Signed)
Chemo f/u call

## 2016-08-25 ENCOUNTER — Other Ambulatory Visit (HOSPITAL_BASED_OUTPATIENT_CLINIC_OR_DEPARTMENT_OTHER): Payer: BLUE CROSS/BLUE SHIELD

## 2016-08-25 ENCOUNTER — Ambulatory Visit (HOSPITAL_BASED_OUTPATIENT_CLINIC_OR_DEPARTMENT_OTHER): Payer: BLUE CROSS/BLUE SHIELD

## 2016-08-25 VITALS — BP 145/86 | HR 100 | Temp 98.0°F | Resp 16 | Wt 187.8 lb

## 2016-08-25 DIAGNOSIS — Z5111 Encounter for antineoplastic chemotherapy: Secondary | ICD-10-CM

## 2016-08-25 DIAGNOSIS — C50412 Malignant neoplasm of upper-outer quadrant of left female breast: Secondary | ICD-10-CM | POA: Diagnosis not present

## 2016-08-25 DIAGNOSIS — Z170 Estrogen receptor positive status [ER+]: Principal | ICD-10-CM

## 2016-08-25 LAB — CBC WITH DIFFERENTIAL/PLATELET
BASO%: 1.9 % (ref 0.0–2.0)
BASOS ABS: 0.1 10*3/uL (ref 0.0–0.1)
EOS%: 3.9 % (ref 0.0–7.0)
Eosinophils Absolute: 0.2 10*3/uL (ref 0.0–0.5)
HCT: 32.9 % — ABNORMAL LOW (ref 34.8–46.6)
HEMOGLOBIN: 10.9 g/dL — AB (ref 11.6–15.9)
LYMPH%: 24.4 % (ref 14.0–49.7)
MCH: 27.2 pg (ref 25.1–34.0)
MCHC: 33.1 g/dL (ref 31.5–36.0)
MCV: 82.1 fL (ref 79.5–101.0)
MONO#: 0.3 10*3/uL (ref 0.1–0.9)
MONO%: 7.7 % (ref 0.0–14.0)
NEUT%: 62.1 % (ref 38.4–76.8)
NEUTROS ABS: 2.8 10*3/uL (ref 1.5–6.5)
Platelets: 219 10*3/uL (ref 145–400)
RBC: 4 10*6/uL (ref 3.70–5.45)
RDW: 19.8 % — AB (ref 11.2–14.5)
WBC: 4.5 10*3/uL (ref 3.9–10.3)
lymph#: 1.1 10*3/uL (ref 0.9–3.3)

## 2016-08-25 LAB — COMPREHENSIVE METABOLIC PANEL
ALBUMIN: 3.5 g/dL (ref 3.5–5.0)
ALK PHOS: 74 U/L (ref 40–150)
ALT: 16 U/L (ref 0–55)
ANION GAP: 9 meq/L (ref 3–11)
AST: 17 U/L (ref 5–34)
BUN: 7.3 mg/dL (ref 7.0–26.0)
CALCIUM: 9.1 mg/dL (ref 8.4–10.4)
CO2: 27 mEq/L (ref 22–29)
CREATININE: 0.8 mg/dL (ref 0.6–1.1)
Chloride: 107 mEq/L (ref 98–109)
Glucose: 113 mg/dl (ref 70–140)
Potassium: 3.7 mEq/L (ref 3.5–5.1)
Sodium: 142 mEq/L (ref 136–145)
TOTAL PROTEIN: 6.3 g/dL — AB (ref 6.4–8.3)
Total Bilirubin: 0.52 mg/dL (ref 0.20–1.20)

## 2016-08-25 MED ORDER — DEXAMETHASONE SODIUM PHOSPHATE 10 MG/ML IJ SOLN
4.0000 mg | Freq: Once | INTRAMUSCULAR | Status: AC
  Administered 2016-08-25: 4 mg via INTRAVENOUS

## 2016-08-25 MED ORDER — PACLITAXEL CHEMO INJECTION 300 MG/50ML
80.0000 mg/m2 | Freq: Once | INTRAVENOUS | Status: AC
  Administered 2016-08-25: 162 mg via INTRAVENOUS
  Filled 2016-08-25: qty 27

## 2016-08-25 MED ORDER — SODIUM CHLORIDE 0.9% FLUSH
10.0000 mL | INTRAVENOUS | Status: DC | PRN
  Administered 2016-08-25: 10 mL
  Filled 2016-08-25: qty 10

## 2016-08-25 MED ORDER — DEXAMETHASONE SODIUM PHOSPHATE 10 MG/ML IJ SOLN
INTRAMUSCULAR | Status: AC
  Filled 2016-08-25: qty 1

## 2016-08-25 MED ORDER — HEPARIN SOD (PORK) LOCK FLUSH 100 UNIT/ML IV SOLN
500.0000 [IU] | Freq: Once | INTRAVENOUS | Status: AC | PRN
  Administered 2016-08-25: 500 [IU]
  Filled 2016-08-25: qty 5

## 2016-08-25 MED ORDER — DIPHENHYDRAMINE HCL 50 MG/ML IJ SOLN
12.5000 mg | Freq: Once | INTRAMUSCULAR | Status: AC
  Administered 2016-08-25: 12.5 mg via INTRAVENOUS

## 2016-08-25 MED ORDER — SODIUM CHLORIDE 0.9 % IV SOLN
Freq: Once | INTRAVENOUS | Status: AC
  Administered 2016-08-25: 10:00:00 via INTRAVENOUS

## 2016-08-25 MED ORDER — DIPHENHYDRAMINE HCL 50 MG/ML IJ SOLN
INTRAMUSCULAR | Status: AC
  Filled 2016-08-25: qty 1

## 2016-08-25 MED ORDER — FAMOTIDINE IN NACL 20-0.9 MG/50ML-% IV SOLN
INTRAVENOUS | Status: AC
  Filled 2016-08-25: qty 50

## 2016-08-25 MED ORDER — FAMOTIDINE IN NACL 20-0.9 MG/50ML-% IV SOLN
20.0000 mg | Freq: Once | INTRAVENOUS | Status: AC
  Administered 2016-08-25: 20 mg via INTRAVENOUS

## 2016-08-25 NOTE — Patient Instructions (Signed)
[facility] Discharge Instructions for Patients Receiving Chemotherapy  Today you received the following chemotherapy agents taxol  To help prevent nausea and vomiting after your treatment, we encourage you to take your nausea medication as directed   If you develop nausea and vomiting that is not controlled by your nausea medication, call the clinic.   BELOW ARE SYMPTOMS THAT SHOULD BE REPORTED IMMEDIATELY:  *FEVER GREATER THAN 100.5 F  *CHILLS WITH OR WITHOUT FEVER  NAUSEA AND VOMITING THAT IS NOT CONTROLLED WITH YOUR NAUSEA MEDICATION  *UNUSUAL SHORTNESS OF BREATH  *UNUSUAL BRUISING OR BLEEDING  TENDERNESS IN MOUTH AND THROAT WITH OR WITHOUT PRESENCE OF ULCERS  *URINARY PROBLEMS  *BOWEL PROBLEMS  UNUSUAL RASH Items with * indicate a potential emergency and should be followed up as soon as possible.  Feel free to call the clinic you have any questions or concerns. The clinic phone number is [phone].  

## 2016-09-01 ENCOUNTER — Ambulatory Visit (HOSPITAL_BASED_OUTPATIENT_CLINIC_OR_DEPARTMENT_OTHER): Payer: BLUE CROSS/BLUE SHIELD

## 2016-09-01 ENCOUNTER — Telehealth: Payer: Self-pay | Admitting: Oncology

## 2016-09-01 ENCOUNTER — Other Ambulatory Visit (HOSPITAL_BASED_OUTPATIENT_CLINIC_OR_DEPARTMENT_OTHER): Payer: BLUE CROSS/BLUE SHIELD

## 2016-09-01 ENCOUNTER — Ambulatory Visit (HOSPITAL_BASED_OUTPATIENT_CLINIC_OR_DEPARTMENT_OTHER): Payer: BLUE CROSS/BLUE SHIELD | Admitting: Oncology

## 2016-09-01 ENCOUNTER — Encounter: Payer: Self-pay | Admitting: *Deleted

## 2016-09-01 VITALS — BP 133/87 | HR 94 | Temp 98.6°F | Resp 18 | Ht 62.0 in | Wt 184.6 lb

## 2016-09-01 DIAGNOSIS — C50412 Malignant neoplasm of upper-outer quadrant of left female breast: Secondary | ICD-10-CM

## 2016-09-01 DIAGNOSIS — Z5111 Encounter for antineoplastic chemotherapy: Secondary | ICD-10-CM | POA: Diagnosis not present

## 2016-09-01 DIAGNOSIS — C773 Secondary and unspecified malignant neoplasm of axilla and upper limb lymph nodes: Secondary | ICD-10-CM

## 2016-09-01 DIAGNOSIS — Z170 Estrogen receptor positive status [ER+]: Secondary | ICD-10-CM | POA: Diagnosis not present

## 2016-09-01 LAB — CBC WITH DIFFERENTIAL/PLATELET
BASO%: 1 % (ref 0.0–2.0)
Basophils Absolute: 0 10*3/uL (ref 0.0–0.1)
EOS ABS: 0.2 10*3/uL (ref 0.0–0.5)
EOS%: 3.6 % (ref 0.0–7.0)
HCT: 31.8 % — ABNORMAL LOW (ref 34.8–46.6)
HEMOGLOBIN: 10.4 g/dL — AB (ref 11.6–15.9)
LYMPH%: 22.9 % (ref 14.0–49.7)
MCH: 27 pg (ref 25.1–34.0)
MCHC: 32.7 g/dL (ref 31.5–36.0)
MCV: 82.5 fL (ref 79.5–101.0)
MONO#: 0.4 10*3/uL (ref 0.1–0.9)
MONO%: 8.1 % (ref 0.0–14.0)
NEUT%: 64.4 % (ref 38.4–76.8)
NEUTROS ABS: 3 10*3/uL (ref 1.5–6.5)
Platelets: 198 10*3/uL (ref 145–400)
RBC: 3.86 10*6/uL (ref 3.70–5.45)
RDW: 20.1 % — AB (ref 11.2–14.5)
WBC: 4.7 10*3/uL (ref 3.9–10.3)
lymph#: 1.1 10*3/uL (ref 0.9–3.3)

## 2016-09-01 LAB — COMPREHENSIVE METABOLIC PANEL
ALBUMIN: 3.6 g/dL (ref 3.5–5.0)
ALK PHOS: 69 U/L (ref 40–150)
ALT: 18 U/L (ref 0–55)
ANION GAP: 9 meq/L (ref 3–11)
AST: 19 U/L (ref 5–34)
BILIRUBIN TOTAL: 0.56 mg/dL (ref 0.20–1.20)
BUN: 8.5 mg/dL (ref 7.0–26.0)
CALCIUM: 9.1 mg/dL (ref 8.4–10.4)
CO2: 23 mEq/L (ref 22–29)
CREATININE: 0.7 mg/dL (ref 0.6–1.1)
Chloride: 106 mEq/L (ref 98–109)
EGFR: >90 mL/min/{1.73_m2} (ref >90)
Glucose: 121 mg/dl (ref 70–140)
Potassium: 3.5 mEq/L (ref 3.5–5.1)
Sodium: 139 mEq/L (ref 136–145)
TOTAL PROTEIN: 6.4 g/dL (ref 6.4–8.3)

## 2016-09-01 MED ORDER — FAMOTIDINE IN NACL 20-0.9 MG/50ML-% IV SOLN
INTRAVENOUS | Status: AC
Start: 2016-09-01 — End: 2016-09-01
  Filled 2016-09-01: qty 50

## 2016-09-01 MED ORDER — DIPHENHYDRAMINE HCL 50 MG/ML IJ SOLN
12.5000 mg | Freq: Once | INTRAMUSCULAR | Status: AC
  Administered 2016-09-01: 12.5 mg via INTRAVENOUS

## 2016-09-01 MED ORDER — SODIUM CHLORIDE 0.9 % IV SOLN
Freq: Once | INTRAVENOUS | Status: AC
  Administered 2016-09-01: 10:00:00 via INTRAVENOUS

## 2016-09-01 MED ORDER — HEPARIN SOD (PORK) LOCK FLUSH 100 UNIT/ML IV SOLN
500.0000 [IU] | Freq: Once | INTRAVENOUS | Status: AC | PRN
  Administered 2016-09-01: 500 [IU]
  Filled 2016-09-01: qty 5

## 2016-09-01 MED ORDER — SODIUM CHLORIDE 0.9% FLUSH
10.0000 mL | INTRAVENOUS | Status: DC | PRN
  Administered 2016-09-01: 10 mL
  Filled 2016-09-01: qty 10

## 2016-09-01 MED ORDER — PACLITAXEL CHEMO INJECTION 300 MG/50ML
80.0000 mg/m2 | Freq: Once | INTRAVENOUS | Status: AC
  Administered 2016-09-01: 162 mg via INTRAVENOUS
  Filled 2016-09-01: qty 27

## 2016-09-01 MED ORDER — FAMOTIDINE IN NACL 20-0.9 MG/50ML-% IV SOLN
20.0000 mg | Freq: Once | INTRAVENOUS | Status: AC
  Administered 2016-09-01: 20 mg via INTRAVENOUS

## 2016-09-01 MED ORDER — DIPHENHYDRAMINE HCL 50 MG/ML IJ SOLN
INTRAMUSCULAR | Status: AC
  Filled 2016-09-01: qty 1

## 2016-09-01 MED ORDER — DEXAMETHASONE SODIUM PHOSPHATE 10 MG/ML IJ SOLN
4.0000 mg | Freq: Once | INTRAMUSCULAR | Status: AC
  Administered 2016-09-01: 4 mg via INTRAVENOUS

## 2016-09-01 MED ORDER — DEXAMETHASONE SODIUM PHOSPHATE 10 MG/ML IJ SOLN
INTRAMUSCULAR | Status: AC
  Filled 2016-09-01: qty 1

## 2016-09-01 NOTE — Progress Notes (Signed)
[place]  [phone] Fax:(336) [phone]     ID: Tanya [name] DOB: Aug 15, 1955  MR#: [id number]  MBW#:[id number]  Patient Care Team: Tanya Coombe, MD as PCP - General Tanya Bookbinder, MD as Consulting Physician (General Surgery) Tanya [name], Tanya Dad, MD as Consulting Physician (Oncology) Tanya Rudd, MD as Consulting Physician (Radiation Oncology) Tanya Cruel, MD OTHER MD:  CHIEF COMPLAINT: Estrogen receptor positive breast cancer  CURRENT TREATMENT: Neoadjuvant chemotherapy   BREAST CANCER HISTORY: From the original intake note:  Tanya [name] had routine bilateral screening mammography at the [facility] 04/19/2016. This showed a possible area of the symmetry in the left breast and a prominent left axillary lymph nodes. On 04/28/2016 she underwent diagnostic left mammography with tomography and left breast ultrasonography. This showed the breast density to be category B. There was an obscured mass in the upper outer left breast and an enlarged left axillary lymph node. On exam there was focal thickening at the 2:00 position of the left breast 9 cm from the nipple. Targeted ultrasonography confirmed a 1.8 cm hypoechoic mass with a second adjacent 1.1 cm mass both at the 2:00 position of the left breast 9 cm from the nipple. The conglomerate area measured 3.5 cm. There was also an enlarged left axillary lymph node with loss of fatty hilum measuring 1.8 cm.  On family 16 2018 the patient underwent biopsy of the larger left breast mass in question and the suspicious left axillary lymph node. 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  The patient's subsequent history is as detailed below  INTERVAL HISTORY: Tanya [name] returns today for  follow-up and treatment of her estrogen receptor positive breast cancer accompanied by one of her granddaughters. Today is day 1 cycle 4 of 12 planned doses of paclitaxel, which she receives weekly.  She continues to tolerate the treatments remarkably well. She has had no numbness or tingling in her fingertips or toetips. She has had no nausea or vomiting. She has had no unusual fatigue.  REVIEW OF SYSTEMS: Tanya [name] developed significant pain in her left maxilla. She started using Sensodyne and she says that has helped a lot. When she uses deodorant and her left armpit she has a feeling that the deodorant is entering the armpit. This is disturbing to her. Aside from that a detailed review of systems today was stable  PAST MEDICAL HISTORY: Past Medical History:  Diagnosis Date  . ANXIETY   . Dyspnea on exertion   . HYPERLIPIDEMIA   . HYPERTENSION, BENIGN ESSENTIAL   . Malignant neoplasm of upper-outer quadrant of left breast in female, estrogen receptor positive ([place]) 05/04/2016  . OBESITY     PAST SURGICAL HISTORY: Past Surgical History:  Procedure Laterality Date  . CESAREAN SECTION    . PORTACATH PLACEMENT Right 06/02/2016   Procedure: INSERTION PORT-A-CATH WITH ULTRASOUND GUIDANCE;  Surgeon: Tanya Bookbinder, MD;  Location: [place];  Service: General;  Laterality: Right;  . TUBAL LIGATION      FAMILY HISTORY Family History  Problem Relation Age of Onset  . Breast cancer Mother 46  The patient's father had a history of prostate cancer. He died at age 50. The patient's mother died at age 12 from unclear causes. She had a history of breast cancer diagnosed in her 53s. The patient had 4 brothers,  2 sisters. There is no other history of breast or ovarian cancer in the family to the patient's knowledge  GYNECOLOGIC HISTORY:  No LMP recorded. Patient is postmenopausal. Menarche age 89, first live birth age 79, the patient is [place] P4. She stopped having periods  approximately 2006. She did not use hormone replacement. She used oral contraceptives briefly and remotely, with no complications.  SOCIAL HISTORY:  The patient is originally from [name]. 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 The patient's husband Tanya [name] was also a professor at [employer], where he taught agriculture oh research. Son Tanya [name] works in Land, son Tanya [name] works for [employer], daughter Tanya [name] is a Education officer, museum, and daughter Tanya [name] is also a Education officer, museum, all living in [place]. The patient has 3 grandchildren. She attends [employer]    ADVANCED DIRECTIVES: Not in place   HEALTH MAINTENANCE: Social History  Substance Use Topics  . Smoking status: Never Smoker  . Smokeless tobacco: Never Used  . Alcohol use Yes     Comment: rarely     Colonoscopy:Never  PAP: November 2017  Bone density: Never   Allergies  Allergen Reactions  . Camoquin [Amodiaquine] Itching    Current Outpatient Prescriptions  Medication Sig Dispense Refill  . lidocaine-prilocaine (EMLA) cream Apply to affected area once 30 g 3  . losartan (COZAAR) 50 MG tablet Take 0.5 tablets (25 mg total) by mouth daily. 90 tablet 2  . omeprazole (PRILOSEC) 40 MG capsule Take 1 capsule (40 mg total) by mouth daily. 90 capsule 3  . pravastatin (PRAVACHOL) 40 MG tablet Take 1 tablet (40 mg total) by mouth at bedtime. 90 tablet 2  . prochlorperazine (COMPAZINE) 10 MG tablet Take 1 tablet (10 mg total) by mouth every 6 (six) hours as needed (Nausea or vomiting). 30 tablet 1  . simethicone (MYLICON) 80 MG chewable tablet Chew 80 mg by mouth every 6 (six) hours as needed.     No current facility-administered medications for this visit.     OBJECTIVE: Middle-aged African-American woman In no acute distress  Vitals:   09/01/16 0832  BP: 133/87  Pulse: 94  Resp: 18  Temp: 98.6 F (37 C)     Body mass index is 33.76 kg/m.    ECOG FS:1 - Symptomatic but completely  ambulatory  Sclerae unicteric, pupils round and equal Oropharynx shows no obvious decay in the involved teeth, and percussion of the teeth does not elicit any tenderness; there are no loose teeth and no obvious gum problems No cervical or supraclavicular adenopathy Lungs no rales or rhonchi Heart regular rate and rhythm Abd soft, nontender, positive bowel sounds MSK no focal spinal tenderness, no upper extremity lymphedema Neuro: nonfocal, well oriented, appropriate affect Breasts: I do not palpate a mass in either breast. There are no skin or nipple changes of concern. Both axillae are benign.   LAB RESULTS:  CMP     Component Value Date/Time   NA 142 08/25/2016 0834   K 3.7 08/25/2016 0834   CL 102 01/26/2016 1629   CO2 27 08/25/2016 0834   GLUCOSE 113 08/25/2016 0834   BUN 7.3 08/25/2016 0834   CREATININE 0.8 08/25/2016 0834   CALCIUM 9.1 08/25/2016 0834   PROT 6.3 (L) 08/25/2016 0834   ALBUMIN 3.5 08/25/2016 0834   AST 17 08/25/2016 0834   ALT 16 08/25/2016 0834   ALKPHOS 74 08/25/2016 0834   BILITOT 0.52 08/25/2016 0834    INo results found for: SPEP, UPEP  CBC    Component Value Date/Time   WBC 4.5 08/25/2016 0834   WBC 7.6 01/23/2014 1431   RBC 4.00 08/25/2016 0834   RBC 5.41 (H) 01/23/2014 1431   HGB 10.9 (L) 08/25/2016 0834   HCT 32.9 (L) 08/25/2016 0834   PLT 219 08/25/2016 0834   MCV 82.1 08/25/2016 0834   MCH 27.2 08/25/2016 0834   MCH 25.0 (L) 01/23/2014 1431   MCHC 33.1 08/25/2016 0834   MCHC 33.8 01/23/2014 1431   RDW 19.8 (H) 08/25/2016 0834   LYMPHSABS 1.1 08/25/2016 0834   MONOABS 0.3 08/25/2016 0834   EOSABS 0.2 08/25/2016 0834   BASOSABS 0.1 08/25/2016 0834        Chemistry      Component Value Date/Time   NA 142 08/25/2016 0834   K 3.7 08/25/2016 0834   CL 102 01/26/2016 1629   CO2 27 08/25/2016 0834   BUN 7.3 08/25/2016 0834   CREATININE 0.8 08/25/2016 0834      Component Value Date/Time   CALCIUM 9.1 08/25/2016 0834    ALKPHOS 74 08/25/2016 0834   AST 17 08/25/2016 0834   ALT 16 08/25/2016 0834   BILITOT 0.52 08/25/2016 0834       No results found for: LABCA2  No components found for: [id number]  No results for input(s): INR in the last 168 hours.  Urinalysis    Component Value Date/Time   COLORURINE yellow 06/01/2006 1116   APPEARANCEUR Clear 06/01/2006 1116   LABSPEC 1.010 08/18/2016 1217   PHURINE 6.5 08/18/2016 1217   PHURINE 6.5 06/01/2006 1116   GLUCOSEU Negative 08/18/2016 1217   HGBUR Negative 08/18/2016 1217   HGBUR negative 06/01/2006 1116   BILIRUBINUR Negative 08/18/2016 1217   KETONESUR Negative 08/18/2016 1217   PROTEINUR Negative 08/18/2016 1217   UROBILINOGEN 0.2 08/18/2016 1217   NITRITE Negative 08/18/2016 1217   NITRITE negative 06/01/2006 1116   LEUKOCYTESUR Negative 08/18/2016 1217     STUDIES: No results found.  ELIGIBLE FOR AVAILABLE RESEARCH PROTOCOL: Not a candidate for Prevent (already on a statin drug)  ASSESSMENT: 61 y.o. [place] woman status post left breast upper inner quadrant and left axillary lymph node biopsy every 16 2018, both showing invasive ductal carcinoma, grade 3, clinically T1c N1a, or stage IIB, estrogen receptor positive, progesterone receptor and HER-2 negative, with an MIB-1 of 40%  (a) MRI biopsy 05/25/2016 of a second area of concern in the left breast also shows invasive ductal carcinoma  (1) neoadjuvant anastrozole started 05/11/2016, held at the start of chemotherapy  (2) Mammaprint results shows the tumor to be luminal B, high risk, indicating a need for chemotherapy.  (3) chemotherapy will consist of doxorubicin and cyclophosphamide in dose dense fashion 4 starting 06/09/2016, completed 07/21/2016, followed by weekly paclitaxel 12 started 08/11/2016  (4) breast conserving surgery with targeted axillary dissection to follow  (5) adjuvant radiation as appropriate  (6) adjuvant anti-estrogens to be continued a minimum of 5  years  PLAN: Tanya [name] is tolerating Taxol remarkably well and the plan is to continue with treatments weekly until she is either done with all 12 treatments or she develops toxicities.  I do not think the left maxillary discomfort has anything to do with her treatment. My exam, admittedly nodded dentists exam, was entirely unremarkable. I suggested she continue to chew on her right side for now and continue to use the Sensodyne. I gave her the name and number of my dentist so she can try to get an appointment there  as soon as possible. I have also called our Hospital dentist in case he is able to work her in today.  I reassured her that the deodorant she is using is the right one and is not going to cause her any problems.  We are proceeding with treatment today. She will be treated again next week with no visit but she knows to let us know if any problems develops particularly if any neuropathy develops. Otherwise I will see her again in 2 weeks.   Marland Kitchen  Tanya [name] 09/01/2016 Medical Oncology and Hematology [facility] [address] [place], [place] [address] Tel. [phone]    Fax. [phone]

## 2016-09-01 NOTE — Telephone Encounter (Signed)
sch appts per los. Pt did not want 0800 time [per GM orders

## 2016-09-01 NOTE — Patient Instructions (Signed)
[facility] Discharge Instructions for Patients Receiving Chemotherapy  Today you received the following chemotherapy agents:  Taxol  To help prevent nausea and vomiting after your treatment, we encourage you to take your nausea medication as prescribed.   If you develop nausea and vomiting that is not controlled by your nausea medication, call the clinic.   BELOW ARE SYMPTOMS THAT SHOULD BE REPORTED IMMEDIATELY:  *FEVER GREATER THAN 100.5 F  *CHILLS WITH OR WITHOUT FEVER  NAUSEA AND VOMITING THAT IS NOT CONTROLLED WITH YOUR NAUSEA MEDICATION  *UNUSUAL SHORTNESS OF BREATH  *UNUSUAL BRUISING OR BLEEDING  TENDERNESS IN MOUTH AND THROAT WITH OR WITHOUT PRESENCE OF ULCERS  *URINARY PROBLEMS  *BOWEL PROBLEMS  UNUSUAL RASH Items with * indicate a potential emergency and should be followed up as soon as possible.  Feel free to call the clinic you have any questions or concerns. The clinic phone number is [phone].  Please show the CHEMO ALERT CARD at check-in to the Emergency Department and triage nurse.   

## 2016-09-08 ENCOUNTER — Other Ambulatory Visit (HOSPITAL_BASED_OUTPATIENT_CLINIC_OR_DEPARTMENT_OTHER): Payer: BLUE CROSS/BLUE SHIELD

## 2016-09-08 ENCOUNTER — Ambulatory Visit (HOSPITAL_BASED_OUTPATIENT_CLINIC_OR_DEPARTMENT_OTHER): Payer: BLUE CROSS/BLUE SHIELD

## 2016-09-08 ENCOUNTER — Other Ambulatory Visit: Payer: Self-pay | Admitting: Oncology

## 2016-09-08 VITALS — Temp 98.0°F

## 2016-09-08 DIAGNOSIS — Z170 Estrogen receptor positive status [ER+]: Principal | ICD-10-CM

## 2016-09-08 DIAGNOSIS — Z5111 Encounter for antineoplastic chemotherapy: Secondary | ICD-10-CM

## 2016-09-08 DIAGNOSIS — C50412 Malignant neoplasm of upper-outer quadrant of left female breast: Secondary | ICD-10-CM | POA: Diagnosis not present

## 2016-09-08 LAB — CBC WITH DIFFERENTIAL/PLATELET
BASO%: 1.8 % (ref 0.0–2.0)
BASOS ABS: 0.1 10*3/uL (ref 0.0–0.1)
EOS%: 2.9 % (ref 0.0–7.0)
Eosinophils Absolute: 0.1 10*3/uL (ref 0.0–0.5)
HEMATOCRIT: 32.2 % — AB (ref 34.8–46.6)
HGB: 10.7 g/dL — ABNORMAL LOW (ref 11.6–15.9)
LYMPH%: 21.1 % (ref 14.0–49.7)
MCH: 27.5 pg (ref 25.1–34.0)
MCHC: 33.1 g/dL (ref 31.5–36.0)
MCV: 83 fL (ref 79.5–101.0)
MONO#: 0.3 10*3/uL (ref 0.1–0.9)
MONO%: 7.9 % (ref 0.0–14.0)
NEUT#: 2.7 10*3/uL (ref 1.5–6.5)
NEUT%: 66.3 % (ref 38.4–76.8)
Platelets: 200 10*3/uL (ref 145–400)
RBC: 3.88 10*6/uL (ref 3.70–5.45)
RDW: 19.8 % — ABNORMAL HIGH (ref 11.2–14.5)
WBC: 4.1 10*3/uL (ref 3.9–10.3)
lymph#: 0.9 10*3/uL (ref 0.9–3.3)

## 2016-09-08 LAB — COMPREHENSIVE METABOLIC PANEL
ALBUMIN: 3.7 g/dL (ref 3.5–5.0)
ALK PHOS: 76 U/L (ref 40–150)
ALT: 17 U/L (ref 0–55)
AST: 22 U/L (ref 5–34)
Anion Gap: 9 mEq/L (ref 3–11)
BILIRUBIN TOTAL: 0.69 mg/dL (ref 0.20–1.20)
BUN: 9.6 mg/dL (ref 7.0–26.0)
CO2: 26 meq/L (ref 22–29)
Calcium: 9.4 mg/dL (ref 8.4–10.4)
Chloride: 106 mEq/L (ref 98–109)
Creatinine: 0.8 mg/dL (ref 0.6–1.1)
EGFR: >90 mL/min/{1.73_m2} (ref >90)
GLUCOSE: 140 mg/dL (ref 70–140)
Potassium: 4 mEq/L (ref 3.5–5.1)
SODIUM: 141 meq/L (ref 136–145)
TOTAL PROTEIN: 6.7 g/dL (ref 6.4–8.3)

## 2016-09-08 MED ORDER — DIPHENHYDRAMINE HCL 50 MG/ML IJ SOLN
12.5000 mg | Freq: Once | INTRAMUSCULAR | Status: AC
  Administered 2016-09-08: 12.5 mg via INTRAVENOUS

## 2016-09-08 MED ORDER — SODIUM CHLORIDE 0.9 % IV SOLN
Freq: Once | INTRAVENOUS | Status: AC
  Administered 2016-09-08: 10:00:00 via INTRAVENOUS

## 2016-09-08 MED ORDER — DEXAMETHASONE SODIUM PHOSPHATE 10 MG/ML IJ SOLN
INTRAMUSCULAR | Status: AC
  Filled 2016-09-08: qty 1

## 2016-09-08 MED ORDER — DEXAMETHASONE SODIUM PHOSPHATE 10 MG/ML IJ SOLN
4.0000 mg | Freq: Once | INTRAMUSCULAR | Status: AC
  Administered 2016-09-08: 4 mg via INTRAVENOUS

## 2016-09-08 MED ORDER — SODIUM CHLORIDE 0.9% FLUSH
10.0000 mL | INTRAVENOUS | Status: DC | PRN
  Administered 2016-09-08: 10 mL
  Filled 2016-09-08: qty 10

## 2016-09-08 MED ORDER — FAMOTIDINE IN NACL 20-0.9 MG/50ML-% IV SOLN
INTRAVENOUS | Status: AC
  Filled 2016-09-08: qty 50

## 2016-09-08 MED ORDER — DIPHENHYDRAMINE HCL 50 MG/ML IJ SOLN
INTRAMUSCULAR | Status: AC
  Filled 2016-09-08: qty 1

## 2016-09-08 MED ORDER — HEPARIN SOD (PORK) LOCK FLUSH 100 UNIT/ML IV SOLN
500.0000 [IU] | Freq: Once | INTRAVENOUS | Status: AC | PRN
  Administered 2016-09-08: 500 [IU]
  Filled 2016-09-08: qty 5

## 2016-09-08 MED ORDER — FAMOTIDINE IN NACL 20-0.9 MG/50ML-% IV SOLN
20.0000 mg | Freq: Once | INTRAVENOUS | Status: AC
  Administered 2016-09-08: 20 mg via INTRAVENOUS

## 2016-09-08 MED ORDER — PACLITAXEL CHEMO INJECTION 300 MG/50ML
80.0000 mg/m2 | Freq: Once | INTRAVENOUS | Status: AC
  Administered 2016-09-08: 162 mg via INTRAVENOUS
  Filled 2016-09-08: qty 27

## 2016-09-08 NOTE — Patient Instructions (Signed)
[facility] Discharge Instructions for Patients Receiving Chemotherapy  Today you received the following chemotherapy agents:  Taxol  To help prevent nausea and vomiting after your treatment, we encourage you to take your nausea medication as prescribed.   If you develop nausea and vomiting that is not controlled by your nausea medication, call the clinic.   BELOW ARE SYMPTOMS THAT SHOULD BE REPORTED IMMEDIATELY:  *FEVER GREATER THAN 100.5 F  *CHILLS WITH OR WITHOUT FEVER  NAUSEA AND VOMITING THAT IS NOT CONTROLLED WITH YOUR NAUSEA MEDICATION  *UNUSUAL SHORTNESS OF BREATH  *UNUSUAL BRUISING OR BLEEDING  TENDERNESS IN MOUTH AND THROAT WITH OR WITHOUT PRESENCE OF ULCERS  *URINARY PROBLEMS  *BOWEL PROBLEMS  UNUSUAL RASH Items with * indicate a potential emergency and should be followed up as soon as possible.  Feel free to call the clinic you have any questions or concerns. The clinic phone number is [phone].  Please show the CHEMO ALERT CARD at check-in to the Emergency Department and triage nurse.   

## 2016-09-14 NOTE — Progress Notes (Signed)
[place]  Telephone:(336) [phone] Fax:(336) [phone]     ID: Tanya [name] DOB: 01-Jun-1955  MR#: [id number]  XIP#:[id number]  Patient Care Team: Tanya Coombe, MD as PCP - General Tanya Bookbinder, MD as Consulting Physician (General Surgery) Tanya [name], Tanya Dad, MD as Consulting Physician (Oncology) Tanya Rudd, MD as Consulting Physician (Radiation Oncology) Tanya Cruel, MD OTHER MD:  CHIEF COMPLAINT: Estrogen receptor positive breast cancer  CURRENT TREATMENT: Neoadjuvant chemotherapy   BREAST CANCER HISTORY: From the original intake note:  Tanya [name] had routine bilateral screening mammography at the [facility] 04/19/2016. This showed a possible area of the symmetry in the left breast and a prominent left axillary lymph nodes. On 04/28/2016 she underwent diagnostic left mammography with tomography and left breast ultrasonography. This showed the breast density to be category B. There was an obscured mass in the upper outer left breast and an enlarged left axillary lymph node. On exam there was focal thickening at the 2:00 position of the left breast 9 cm from the nipple. Targeted ultrasonography confirmed a 1.8 cm hypoechoic mass with a second adjacent 1.1 cm mass both at the 2:00 position of the left breast 9 cm from the nipple. The conglomerate area measured 3.5 cm. There was also an enlarged left axillary lymph node with loss of fatty hilum measuring 1.8 cm.  On family 16 2018 the patient underwent biopsy of the larger left breast mass in question and the suspicious left axillary lymph node. 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  The patient's subsequent history is as detailed below  INTERVAL HISTORY: Tanya [name] returns today for  follow-up and treatment of her estrogen receptor positive breast cancer accompanied by her husband. Today is day 1 cycle 6 of 12 planned cycles of weekly paclitaxel.  REVIEW OF SYSTEMS: Tanya [name] continues to tolerate her treatment well. However by days 4 and 5 she is having "flulike symptoms", with headaches, dizziness, and joint aches. She is not taking anything for this. Nausea is not a problem. She finds that eating takes care of that. She has some diarrhea initially but now she is more on the constipated side. She has not developed any peripheral neuropathy symptoms. A detailed review of systems today was otherwise stable  PAST MEDICAL HISTORY: Past Medical History:  Diagnosis Date  . ANXIETY   . Dyspnea on exertion   . HYPERLIPIDEMIA   . HYPERTENSION, BENIGN ESSENTIAL   . Malignant neoplasm of upper-outer quadrant of left breast in female, estrogen receptor positive ([name]) 05/04/2016  . OBESITY     PAST SURGICAL HISTORY: Past Surgical History:  Procedure Laterality Date  . CESAREAN SECTION    . PORTACATH PLACEMENT Right 06/02/2016   Procedure: INSERTION PORT-A-CATH WITH ULTRASOUND GUIDANCE;  Surgeon: Tanya Bookbinder, MD;  Location: [facility];  Service: General;  Laterality: Right;  . TUBAL LIGATION      FAMILY HISTORY Family History  Problem Relation Age of Onset  . Breast cancer Mother 40  The patient's father had a history of prostate cancer. He died at age 25. The patient's mother died at age 49 from unclear causes. She had a history of breast cancer diagnosed in her 13s. The patient had 4 brothers, 2 sisters. There is no other history of breast or ovarian cancer in the family to the patient's knowledge  GYNECOLOGIC HISTORY:  No LMP recorded. Patient is postmenopausal. Menarche age 69, first live birth age 61, the patient is [name] P4. She stopped having periods approximately 2006. She did not use hormone replacement. She used oral contraceptives briefly and  remotely, with no complications.  SOCIAL HISTORY:  The patient is originally from [name]. 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 The patient's husband Tanya [name] was also a professor at [employer], where he taught agriculture oh research. Son Tanya [name] works in Land, son Tanya [name] works for [employer], daughter Tanya [name] is a Education officer, museum, and daughter Tanya [name] is also a Education officer, museum, all living in [place]. The patient has 3 grandchildren. She attends [employer]    ADVANCED DIRECTIVES: Not in place   HEALTH MAINTENANCE: Social History  Substance Use Topics  . Smoking status: Never Smoker  . Smokeless tobacco: Never Used  . Alcohol use Yes     Comment: rarely     Colonoscopy:Never  PAP: November 2017  Bone density: Never   Allergies  Allergen Reactions  . Camoquin [Amodiaquine] Itching    Current Outpatient Prescriptions  Medication Sig Dispense Refill  . lidocaine-prilocaine (EMLA) cream Apply to affected area once 30 g 3  . losartan (COZAAR) 50 MG tablet Take 0.5 tablets (25 mg total) by mouth daily. 90 tablet 2  . omeprazole (PRILOSEC) 40 MG capsule Take 1 capsule (40 mg total) by mouth daily. 90 capsule 3  . pravastatin (PRAVACHOL) 40 MG tablet Take 1 tablet (40 mg total) by mouth at bedtime. 90 tablet 2  . prochlorperazine (COMPAZINE) 10 MG tablet Take 1 tablet (10 mg total) by mouth every 6 (six) hours as needed (Nausea or vomiting). 30 tablet 1  . simethicone (MYLICON) 80 MG chewable tablet Chew 80 mg by mouth every 6 (six) hours as needed.     No current facility-administered medications for this visit.     OBJECTIVE: Middle-aged African-American woman Who appears well  Vitals:   09/15/16 1011  BP: 131/83  Pulse: 100  Resp: 18  Temp: 98.1 F (36.7 C)     Body mass index is 33.73 kg/m.    ECOG FS:0 - Asymptomatic  Sclerae unicteric, EOMs intact Oropharynx clear and moist No cervical or supraclavicular adenopathy Lungs no rales or  rhonchi Heart regular rate and rhythm Abd soft, nontender, positive bowel sounds MSK no focal spinal tenderness, no upper extremity lymphedema Neuro: nonfocal, well oriented, appropriate affect Breasts: Deferred  LAB RESULTS:  CMP     Component Value Date/Time   NA 141 09/08/2016 0901   K 4.0 09/08/2016 0901   CL 102 01/26/2016 1629   CO2 26 09/08/2016 0901   GLUCOSE 140 09/08/2016 0901   BUN 9.6 09/08/2016 0901   CREATININE 0.8 09/08/2016 0901   CALCIUM 9.4 09/08/2016 0901   PROT 6.7 09/08/2016 0901   ALBUMIN 3.7 09/08/2016 0901   AST 22 09/08/2016 0901   ALT 17 09/08/2016 0901   ALKPHOS 76 09/08/2016 0901   BILITOT 0.69 09/08/2016 0901    INo results found for: SPEP, UPEP  CBC    Component Value Date/Time   WBC 4.4 09/15/2016 0958   WBC 7.6 01/23/2014 1431   RBC 3.97 09/15/2016 0958   RBC 5.41 (H) 01/23/2014 1431   HGB 10.8 (L) 09/15/2016 0958   HCT 33.3 (L) 09/15/2016 0958   PLT 238 09/15/2016 0958   MCV 83.9 09/15/2016 0958   MCH 27.2 09/15/2016 0958   MCH 25.0 (L) 01/23/2014 1431   MCHC  32.4 09/15/2016 0958   MCHC 33.8 01/23/2014 1431   RDW 17.7 (H) 09/15/2016 0958   LYMPHSABS 1.0 09/15/2016 0958   MONOABS 0.3 09/15/2016 0958   EOSABS 0.1 09/15/2016 0958   BASOSABS 0.0 09/15/2016 0958        Chemistry      Component Value Date/Time   NA 141 09/08/2016 0901   K 4.0 09/08/2016 0901   CL 102 01/26/2016 1629   CO2 26 09/08/2016 0901   BUN 9.6 09/08/2016 0901   CREATININE 0.8 09/08/2016 0901      Component Value Date/Time   CALCIUM 9.4 09/08/2016 0901   ALKPHOS 76 09/08/2016 0901   AST 22 09/08/2016 0901   ALT 17 09/08/2016 0901   BILITOT 0.69 09/08/2016 0901       No results found for: LABCA2  No components found for: LABCA125  No results for input(s): INR in the last 168 hours.  Urinalysis    Component Value Date/Time   COLORURINE yellow 06/01/2006 1116   APPEARANCEUR Clear 06/01/2006 1116   LABSPEC 1.010 08/18/2016 1217    PHURINE 6.5 08/18/2016 1217   PHURINE 6.5 06/01/2006 1116   GLUCOSEU Negative 08/18/2016 1217   HGBUR Negative 08/18/2016 1217   HGBUR negative 06/01/2006 1116   BILIRUBINUR Negative 08/18/2016 1217   KETONESUR Negative 08/18/2016 1217   PROTEINUR Negative 08/18/2016 1217   UROBILINOGEN 0.2 08/18/2016 1217   NITRITE Negative 08/18/2016 1217   NITRITE negative 06/01/2006 1116   LEUKOCYTESUR Negative 08/18/2016 1217     STUDIES: No results found.  ELIGIBLE FOR AVAILABLE RESEARCH PROTOCOL: Not a candidate for Prevent (already on a statin drug)  ASSESSMENT: 61 y.o. [place] woman status post left breast upper inner quadrant and left axillary lymph node biopsy every 16 2018, both showing invasive ductal carcinoma, grade 3, clinically T1c N1a, or stage IIB, estrogen receptor positive, progesterone receptor and HER-2 negative, with an MIB-1 of 40%  (a) MRI biopsy 05/25/2016 of a second area of concern in the left breast also shows invasive ductal carcinoma  (1) neoadjuvant anastrozole started 05/11/2016, held at the start of chemotherapy  (2) Mammaprint results shows the tumor to be luminal B, high risk, indicating a need for chemotherapy.  (3) chemotherapy will consist of doxorubicin and cyclophosphamide in dose dense fashion 4 starting 06/09/2016, completed 07/21/2016, followed by weekly paclitaxel 12 started 08/11/2016  (4) breast conserving surgery with targeted axillary dissection to follow  (5) adjuvant radiation as appropriate  (6) adjuvant anti-estrogens to be continued a minimum of 5 years  PLAN: [place] [name] will proceed to her sixth dose of weekly paclitaxel today. So far she has not developed any peripheral neuropathy, which is very favorable.  She is having flulike symptoms on days 4 and 5. This is very uncomfortable for her. I suggested she consider taking 1 Aleve and one regular Tylenol together and see if that takes care of the problem.  I am seeing her every other  treatment which means she will not be seen next week but if she develops any peripheral neuropathy symptoms she will let us know.  In the meantime she is not exercising at all. I have suggested she come to our tai chi class here on Wednesday mornings. Her husband can come with her.  They know to call for any other issues that may develop before her next visit. Marland Kitchen  [place] [name] 09/15/2016 Medical Oncology and Hematology [facility] [address] [place], [place] [address] Tel. [phone]    Fax.  [phone]

## 2016-09-15 ENCOUNTER — Other Ambulatory Visit (HOSPITAL_BASED_OUTPATIENT_CLINIC_OR_DEPARTMENT_OTHER): Payer: BLUE CROSS/BLUE SHIELD

## 2016-09-15 ENCOUNTER — Ambulatory Visit (HOSPITAL_BASED_OUTPATIENT_CLINIC_OR_DEPARTMENT_OTHER): Payer: BLUE CROSS/BLUE SHIELD

## 2016-09-15 ENCOUNTER — Encounter: Payer: Self-pay | Admitting: *Deleted

## 2016-09-15 ENCOUNTER — Other Ambulatory Visit: Payer: BLUE CROSS/BLUE SHIELD

## 2016-09-15 ENCOUNTER — Ambulatory Visit: Payer: BLUE CROSS/BLUE SHIELD

## 2016-09-15 ENCOUNTER — Ambulatory Visit (HOSPITAL_BASED_OUTPATIENT_CLINIC_OR_DEPARTMENT_OTHER): Payer: BLUE CROSS/BLUE SHIELD | Admitting: Oncology

## 2016-09-15 ENCOUNTER — Ambulatory Visit: Payer: BLUE CROSS/BLUE SHIELD | Admitting: Oncology

## 2016-09-15 VITALS — BP 131/83 | HR 100 | Temp 98.1°F | Resp 18 | Ht 62.0 in | Wt 184.4 lb

## 2016-09-15 DIAGNOSIS — Z170 Estrogen receptor positive status [ER+]: Secondary | ICD-10-CM | POA: Diagnosis not present

## 2016-09-15 DIAGNOSIS — Z5111 Encounter for antineoplastic chemotherapy: Secondary | ICD-10-CM | POA: Diagnosis not present

## 2016-09-15 DIAGNOSIS — C50412 Malignant neoplasm of upper-outer quadrant of left female breast: Secondary | ICD-10-CM

## 2016-09-15 DIAGNOSIS — C773 Secondary and unspecified malignant neoplasm of axilla and upper limb lymph nodes: Secondary | ICD-10-CM

## 2016-09-15 LAB — COMPREHENSIVE METABOLIC PANEL
ALT: 23 U/L (ref 0–55)
AST: 27 U/L (ref 5–34)
Albumin: 3.7 g/dL (ref 3.5–5.0)
Alkaline Phosphatase: 76 U/L (ref 40–150)
Anion Gap: 9 mEq/L (ref 3–11)
BUN: 6.4 mg/dL — ABNORMAL LOW (ref 7.0–26.0)
CALCIUM: 9.4 mg/dL (ref 8.4–10.4)
CHLORIDE: 106 meq/L (ref 98–109)
CO2: 27 meq/L (ref 22–29)
CREATININE: 0.7 mg/dL (ref 0.6–1.1)
EGFR: >90 mL/min/{1.73_m2} (ref >90)
Glucose: 145 mg/dl — ABNORMAL HIGH (ref 70–140)
POTASSIUM: 3.9 meq/L (ref 3.5–5.1)
SODIUM: 142 meq/L (ref 136–145)
Total Bilirubin: 0.69 mg/dL (ref 0.20–1.20)
Total Protein: 6.7 g/dL (ref 6.4–8.3)

## 2016-09-15 LAB — CBC WITH DIFFERENTIAL/PLATELET
BASO%: 0.9 % (ref 0.0–2.0)
BASOS ABS: 0 10*3/uL (ref 0.0–0.1)
EOS ABS: 0.1 10*3/uL (ref 0.0–0.5)
EOS%: 2.7 % (ref 0.0–7.0)
HCT: 33.3 % — ABNORMAL LOW (ref 34.8–46.6)
HGB: 10.8 g/dL — ABNORMAL LOW (ref 11.6–15.9)
LYMPH%: 21.4 % (ref 14.0–49.7)
MCH: 27.2 pg (ref 25.1–34.0)
MCHC: 32.4 g/dL (ref 31.5–36.0)
MCV: 83.9 fL (ref 79.5–101.0)
MONO#: 0.3 10*3/uL (ref 0.1–0.9)
MONO%: 7 % (ref 0.0–14.0)
NEUT%: 68 % (ref 38.4–76.8)
NEUTROS ABS: 3 10*3/uL (ref 1.5–6.5)
PLATELETS: 238 10*3/uL (ref 145–400)
RBC: 3.97 10*6/uL (ref 3.70–5.45)
RDW: 17.7 % — ABNORMAL HIGH (ref 11.2–14.5)
WBC: 4.4 10*3/uL (ref 3.9–10.3)
lymph#: 1 10*3/uL (ref 0.9–3.3)

## 2016-09-15 MED ORDER — SODIUM CHLORIDE 0.9% FLUSH
10.0000 mL | INTRAVENOUS | Status: DC | PRN
  Administered 2016-09-15: 10 mL
  Filled 2016-09-15: qty 10

## 2016-09-15 MED ORDER — DIPHENHYDRAMINE HCL 50 MG/ML IJ SOLN
INTRAMUSCULAR | Status: AC
  Filled 2016-09-15: qty 1

## 2016-09-15 MED ORDER — DEXAMETHASONE SODIUM PHOSPHATE 10 MG/ML IJ SOLN
4.0000 mg | Freq: Once | INTRAMUSCULAR | Status: AC
  Administered 2016-09-15: 4 mg via INTRAVENOUS

## 2016-09-15 MED ORDER — FAMOTIDINE IN NACL 20-0.9 MG/50ML-% IV SOLN
INTRAVENOUS | Status: AC
  Filled 2016-09-15: qty 50

## 2016-09-15 MED ORDER — DIPHENHYDRAMINE HCL 50 MG/ML IJ SOLN
12.5000 mg | Freq: Once | INTRAMUSCULAR | Status: AC
  Administered 2016-09-15: 12.5 mg via INTRAVENOUS

## 2016-09-15 MED ORDER — SODIUM CHLORIDE 0.9 % IV SOLN
Freq: Once | INTRAVENOUS | Status: AC
  Administered 2016-09-15: 11:00:00 via INTRAVENOUS

## 2016-09-15 MED ORDER — HEPARIN SOD (PORK) LOCK FLUSH 100 UNIT/ML IV SOLN
500.0000 [IU] | Freq: Once | INTRAVENOUS | Status: AC | PRN
  Administered 2016-09-15: 500 [IU]
  Filled 2016-09-15: qty 5

## 2016-09-15 MED ORDER — PACLITAXEL CHEMO INJECTION 300 MG/50ML
80.0000 mg/m2 | Freq: Once | INTRAVENOUS | Status: AC
  Administered 2016-09-15: 162 mg via INTRAVENOUS
  Filled 2016-09-15: qty 27

## 2016-09-15 MED ORDER — DEXAMETHASONE SODIUM PHOSPHATE 10 MG/ML IJ SOLN
INTRAMUSCULAR | Status: AC
  Filled 2016-09-15: qty 1

## 2016-09-15 MED ORDER — FAMOTIDINE IN NACL 20-0.9 MG/50ML-% IV SOLN
20.0000 mg | Freq: Once | INTRAVENOUS | Status: AC
  Administered 2016-09-15: 20 mg via INTRAVENOUS

## 2016-09-15 NOTE — Patient Instructions (Signed)
[facility] Discharge Instructions for Patients Receiving Chemotherapy  Today you received the following chemotherapy agents Taxol   To help prevent nausea and vomiting after your treatment, we encourage you to take your nausea medication as directed.   If you develop nausea and vomiting that is not controlled by your nausea medication, call the clinic.   BELOW ARE SYMPTOMS THAT SHOULD BE REPORTED IMMEDIATELY:  *FEVER GREATER THAN 100.5 F  *CHILLS WITH OR WITHOUT FEVER  NAUSEA AND VOMITING THAT IS NOT CONTROLLED WITH YOUR NAUSEA MEDICATION  *UNUSUAL SHORTNESS OF BREATH  *UNUSUAL BRUISING OR BLEEDING  TENDERNESS IN MOUTH AND THROAT WITH OR WITHOUT PRESENCE OF ULCERS  *URINARY PROBLEMS  *BOWEL PROBLEMS  UNUSUAL RASH Items with * indicate a potential emergency and should be followed up as soon as possible.  Feel free to call the clinic you have any questions or concerns. The clinic phone number is [phone].  Please show the CHEMO ALERT CARD at check-in to the Emergency Department and triage nurse.   

## 2016-09-22 ENCOUNTER — Other Ambulatory Visit (HOSPITAL_BASED_OUTPATIENT_CLINIC_OR_DEPARTMENT_OTHER): Payer: BLUE CROSS/BLUE SHIELD

## 2016-09-22 ENCOUNTER — Ambulatory Visit (HOSPITAL_BASED_OUTPATIENT_CLINIC_OR_DEPARTMENT_OTHER): Payer: BLUE CROSS/BLUE SHIELD

## 2016-09-22 VITALS — BP 138/86 | HR 100 | Temp 98.4°F | Resp 18 | Wt 182.8 lb

## 2016-09-22 DIAGNOSIS — Z5111 Encounter for antineoplastic chemotherapy: Secondary | ICD-10-CM

## 2016-09-22 DIAGNOSIS — Z170 Estrogen receptor positive status [ER+]: Principal | ICD-10-CM

## 2016-09-22 DIAGNOSIS — C50412 Malignant neoplasm of upper-outer quadrant of left female breast: Secondary | ICD-10-CM

## 2016-09-22 LAB — CBC WITH DIFFERENTIAL/PLATELET
BASO%: 1 % (ref 0.0–2.0)
BASOS ABS: 0 10*3/uL (ref 0.0–0.1)
EOS%: 2.5 % (ref 0.0–7.0)
Eosinophils Absolute: 0.1 10*3/uL (ref 0.0–0.5)
HEMATOCRIT: 32.8 % — AB (ref 34.8–46.6)
HGB: 10.4 g/dL — ABNORMAL LOW (ref 11.6–15.9)
LYMPH%: 25.3 % (ref 14.0–49.7)
MCH: 26.8 pg (ref 25.1–34.0)
MCHC: 31.7 g/dL (ref 31.5–36.0)
MCV: 84.5 fL (ref 79.5–101.0)
MONO#: 0.3 10*3/uL (ref 0.1–0.9)
MONO%: 8.3 % (ref 0.0–14.0)
NEUT#: 2.5 10*3/uL (ref 1.5–6.5)
NEUT%: 62.9 % (ref 38.4–76.8)
PLATELETS: 231 10*3/uL (ref 145–400)
RBC: 3.88 10*6/uL (ref 3.70–5.45)
RDW: 17.3 % — ABNORMAL HIGH (ref 11.2–14.5)
WBC: 4 10*3/uL (ref 3.9–10.3)
lymph#: 1 10*3/uL (ref 0.9–3.3)

## 2016-09-22 LAB — COMPREHENSIVE METABOLIC PANEL
ALBUMIN: 3.6 g/dL (ref 3.5–5.0)
ALK PHOS: 69 U/L (ref 40–150)
ALT: 18 U/L (ref 0–55)
AST: 20 U/L (ref 5–34)
Anion Gap: 8 mEq/L (ref 3–11)
BUN: 6.8 mg/dL — AB (ref 7.0–26.0)
CO2: 27 meq/L (ref 22–29)
Calcium: 9.2 mg/dL (ref 8.4–10.4)
Chloride: 106 mEq/L (ref 98–109)
Creatinine: 0.8 mg/dL (ref 0.6–1.1)
EGFR: >90 mL/min/{1.73_m2} (ref >90)
GLUCOSE: 143 mg/dL — AB (ref 70–140)
POTASSIUM: 3.9 meq/L (ref 3.5–5.1)
SODIUM: 141 meq/L (ref 136–145)
TOTAL PROTEIN: 6.5 g/dL (ref 6.4–8.3)
Total Bilirubin: 0.63 mg/dL (ref 0.20–1.20)

## 2016-09-22 MED ORDER — FAMOTIDINE IN NACL 20-0.9 MG/50ML-% IV SOLN
INTRAVENOUS | Status: AC
  Filled 2016-09-22: qty 50

## 2016-09-22 MED ORDER — SODIUM CHLORIDE 0.9 % IV SOLN
Freq: Once | INTRAVENOUS | Status: AC
  Administered 2016-09-22: 11:00:00 via INTRAVENOUS

## 2016-09-22 MED ORDER — HEPARIN SOD (PORK) LOCK FLUSH 100 UNIT/ML IV SOLN
500.0000 [IU] | Freq: Once | INTRAVENOUS | Status: AC | PRN
  Administered 2016-09-22: 500 [IU]
  Filled 2016-09-22: qty 5

## 2016-09-22 MED ORDER — DEXAMETHASONE SODIUM PHOSPHATE 10 MG/ML IJ SOLN
INTRAMUSCULAR | Status: AC
  Filled 2016-09-22: qty 1

## 2016-09-22 MED ORDER — DEXAMETHASONE SODIUM PHOSPHATE 10 MG/ML IJ SOLN
4.0000 mg | Freq: Once | INTRAMUSCULAR | Status: AC
  Administered 2016-09-22: 4 mg via INTRAVENOUS

## 2016-09-22 MED ORDER — PACLITAXEL CHEMO INJECTION 300 MG/50ML
80.0000 mg/m2 | Freq: Once | INTRAVENOUS | Status: AC
  Administered 2016-09-22: 162 mg via INTRAVENOUS
  Filled 2016-09-22: qty 27

## 2016-09-22 MED ORDER — SODIUM CHLORIDE 0.9% FLUSH
10.0000 mL | INTRAVENOUS | Status: DC | PRN
  Administered 2016-09-22: 10 mL
  Filled 2016-09-22: qty 10

## 2016-09-22 MED ORDER — DIPHENHYDRAMINE HCL 50 MG/ML IJ SOLN
12.5000 mg | Freq: Once | INTRAMUSCULAR | Status: AC
  Administered 2016-09-22: 12.5 mg via INTRAVENOUS

## 2016-09-22 MED ORDER — FAMOTIDINE IN NACL 20-0.9 MG/50ML-% IV SOLN
20.0000 mg | Freq: Once | INTRAVENOUS | Status: AC
  Administered 2016-09-22: 20 mg via INTRAVENOUS

## 2016-09-22 MED ORDER — DIPHENHYDRAMINE HCL 50 MG/ML IJ SOLN
INTRAMUSCULAR | Status: AC
  Filled 2016-09-22: qty 1

## 2016-09-22 NOTE — Patient Instructions (Signed)
[facility] Discharge Instructions for Patients Receiving Chemotherapy  Today you received the following chemotherapy agents Taxol   To help prevent nausea and vomiting after your treatment, we encourage you to take your nausea medication as directed.   If you develop nausea and vomiting that is not controlled by your nausea medication, call the clinic.   BELOW ARE SYMPTOMS THAT SHOULD BE REPORTED IMMEDIATELY:  *FEVER GREATER THAN 100.5 F  *CHILLS WITH OR WITHOUT FEVER  NAUSEA AND VOMITING THAT IS NOT CONTROLLED WITH YOUR NAUSEA MEDICATION  *UNUSUAL SHORTNESS OF BREATH  *UNUSUAL BRUISING OR BLEEDING  TENDERNESS IN MOUTH AND THROAT WITH OR WITHOUT PRESENCE OF ULCERS  *URINARY PROBLEMS  *BOWEL PROBLEMS  UNUSUAL RASH Items with * indicate a potential emergency and should be followed up as soon as possible.  Feel free to call the clinic you have any questions or concerns. The clinic phone number is [phone].  Please show the CHEMO ALERT CARD at check-in to the Emergency Department and triage nurse.   

## 2016-09-29 ENCOUNTER — Ambulatory Visit (HOSPITAL_BASED_OUTPATIENT_CLINIC_OR_DEPARTMENT_OTHER): Payer: BLUE CROSS/BLUE SHIELD

## 2016-09-29 ENCOUNTER — Ambulatory Visit (HOSPITAL_BASED_OUTPATIENT_CLINIC_OR_DEPARTMENT_OTHER): Payer: BLUE CROSS/BLUE SHIELD | Admitting: Oncology

## 2016-09-29 ENCOUNTER — Other Ambulatory Visit (HOSPITAL_BASED_OUTPATIENT_CLINIC_OR_DEPARTMENT_OTHER): Payer: BLUE CROSS/BLUE SHIELD

## 2016-09-29 VITALS — BP 136/82 | HR 91 | Temp 98.2°F | Resp 18 | Ht 62.0 in | Wt 182.6 lb

## 2016-09-29 DIAGNOSIS — C773 Secondary and unspecified malignant neoplasm of axilla and upper limb lymph nodes: Secondary | ICD-10-CM

## 2016-09-29 DIAGNOSIS — C50412 Malignant neoplasm of upper-outer quadrant of left female breast: Secondary | ICD-10-CM

## 2016-09-29 DIAGNOSIS — Z170 Estrogen receptor positive status [ER+]: Secondary | ICD-10-CM

## 2016-09-29 DIAGNOSIS — Z5111 Encounter for antineoplastic chemotherapy: Secondary | ICD-10-CM | POA: Diagnosis not present

## 2016-09-29 LAB — COMPREHENSIVE METABOLIC PANEL
ALBUMIN: 3.8 g/dL (ref 3.5–5.0)
ALK PHOS: 72 U/L (ref 40–150)
ALT: 16 U/L (ref 0–55)
ANION GAP: 10 meq/L (ref 3–11)
AST: 20 U/L (ref 5–34)
BUN: 6.9 mg/dL — ABNORMAL LOW (ref 7.0–26.0)
CALCIUM: 9.5 mg/dL (ref 8.4–10.4)
CO2: 23 mEq/L (ref 22–29)
Chloride: 107 mEq/L (ref 98–109)
Creatinine: 0.7 mg/dL (ref 0.6–1.1)
Glucose: 100 mg/dl (ref 70–140)
POTASSIUM: 3.8 meq/L (ref 3.5–5.1)
Sodium: 141 mEq/L (ref 136–145)
Total Bilirubin: 0.67 mg/dL (ref 0.20–1.20)
Total Protein: 6.8 g/dL (ref 6.4–8.3)

## 2016-09-29 LAB — CBC WITH DIFFERENTIAL/PLATELET
BASO%: 1.6 % (ref 0.0–2.0)
Basophils Absolute: 0.1 10*3/uL (ref 0.0–0.1)
EOS%: 2.3 % (ref 0.0–7.0)
Eosinophils Absolute: 0.1 10*3/uL (ref 0.0–0.5)
HEMATOCRIT: 31.9 % — AB (ref 34.8–46.6)
HGB: 10.4 g/dL — ABNORMAL LOW (ref 11.6–15.9)
LYMPH#: 1.2 10*3/uL (ref 0.9–3.3)
LYMPH%: 28.8 % (ref 14.0–49.7)
MCH: 27.5 pg (ref 25.1–34.0)
MCHC: 32.6 g/dL (ref 31.5–36.0)
MCV: 84.4 fL (ref 79.5–101.0)
MONO#: 0.4 10*3/uL (ref 0.1–0.9)
MONO%: 9.3 % (ref 0.0–14.0)
NEUT%: 58 % (ref 38.4–76.8)
NEUTROS ABS: 2.5 10*3/uL (ref 1.5–6.5)
PLATELETS: 278 10*3/uL (ref 145–400)
RBC: 3.78 10*6/uL (ref 3.70–5.45)
RDW: 17.1 % — ABNORMAL HIGH (ref 11.2–14.5)
WBC: 4.3 10*3/uL (ref 3.9–10.3)

## 2016-09-29 MED ORDER — DEXAMETHASONE SODIUM PHOSPHATE 10 MG/ML IJ SOLN
4.0000 mg | Freq: Once | INTRAMUSCULAR | Status: AC
  Administered 2016-09-29: 4 mg via INTRAVENOUS

## 2016-09-29 MED ORDER — PACLITAXEL CHEMO INJECTION 300 MG/50ML
80.0000 mg/m2 | Freq: Once | INTRAVENOUS | Status: AC
  Administered 2016-09-29: 162 mg via INTRAVENOUS
  Filled 2016-09-29: qty 27

## 2016-09-29 MED ORDER — FAMOTIDINE IN NACL 20-0.9 MG/50ML-% IV SOLN
INTRAVENOUS | Status: AC
  Filled 2016-09-29: qty 50

## 2016-09-29 MED ORDER — DEXAMETHASONE SODIUM PHOSPHATE 10 MG/ML IJ SOLN
INTRAMUSCULAR | Status: AC
  Filled 2016-09-29: qty 1

## 2016-09-29 MED ORDER — SODIUM CHLORIDE 0.9 % IV SOLN
Freq: Once | INTRAVENOUS | Status: AC
  Administered 2016-09-29: 12:00:00 via INTRAVENOUS

## 2016-09-29 MED ORDER — DIPHENHYDRAMINE HCL 50 MG/ML IJ SOLN
INTRAMUSCULAR | Status: AC
  Filled 2016-09-29: qty 1

## 2016-09-29 MED ORDER — DIPHENHYDRAMINE HCL 50 MG/ML IJ SOLN
12.5000 mg | Freq: Once | INTRAMUSCULAR | Status: AC
  Administered 2016-09-29: 12.5 mg via INTRAVENOUS

## 2016-09-29 MED ORDER — SODIUM CHLORIDE 0.9% FLUSH
10.0000 mL | INTRAVENOUS | Status: DC | PRN
  Administered 2016-09-29: 10 mL
  Filled 2016-09-29: qty 10

## 2016-09-29 MED ORDER — FAMOTIDINE IN NACL 20-0.9 MG/50ML-% IV SOLN
20.0000 mg | Freq: Once | INTRAVENOUS | Status: AC
  Administered 2016-09-29: 20 mg via INTRAVENOUS

## 2016-09-29 MED ORDER — HEPARIN SOD (PORK) LOCK FLUSH 100 UNIT/ML IV SOLN
500.0000 [IU] | Freq: Once | INTRAVENOUS | Status: AC | PRN
  Administered 2016-09-29: 500 [IU]
  Filled 2016-09-29: qty 5

## 2016-09-29 NOTE — Patient Instructions (Signed)
[facility] Discharge Instructions for Patients Receiving Chemotherapy  Today you received the following chemotherapy agents Taxol   To help prevent nausea and vomiting after your treatment, we encourage you to take your nausea medication as directed.   If you develop nausea and vomiting that is not controlled by your nausea medication, call the clinic.   BELOW ARE SYMPTOMS THAT SHOULD BE REPORTED IMMEDIATELY:  *FEVER GREATER THAN 100.5 F  *CHILLS WITH OR WITHOUT FEVER  NAUSEA AND VOMITING THAT IS NOT CONTROLLED WITH YOUR NAUSEA MEDICATION  *UNUSUAL SHORTNESS OF BREATH  *UNUSUAL BRUISING OR BLEEDING  TENDERNESS IN MOUTH AND THROAT WITH OR WITHOUT PRESENCE OF ULCERS  *URINARY PROBLEMS  *BOWEL PROBLEMS  UNUSUAL RASH Items with * indicate a potential emergency and should be followed up as soon as possible.  Feel free to call the clinic you have any questions or concerns. The clinic phone number is [phone].  Please show the CHEMO ALERT CARD at check-in to the Emergency Department and triage nurse.   

## 2016-09-29 NOTE — Progress Notes (Signed)
Tanya [name]  Telephone:([phone] Fax:(336) [phone]     ID: Tanya [name] DOB: 1955/10/12  MR#: [id number]  PJK#:[id number]  Patient Care Team: Everrett Coombe, MD as PCP - General Rolm Bookbinder, MD as Consulting Physician (General Surgery) Magrinat, Virgie Dad, MD as Consulting Physician (Oncology) Kyung Rudd, MD as Consulting Physician (Radiation Oncology) Chauncey Cruel, MD OTHER MD:  CHIEF COMPLAINT: Estrogen receptor positive breast cancer  CURRENT TREATMENT: Neoadjuvant chemotherapy   BREAST CANCER HISTORY: From the original intake note:  Tanya [name] had routine bilateral screening mammography at [facility] 04/19/2016. This showed a possible area of the symmetry in the left breast and a prominent left axillary lymph nodes. On 04/28/2016 she underwent diagnostic left mammography with tomography and left breast ultrasonography. This showed the breast density to be category B. There was an obscured mass in the upper outer left breast and an enlarged left axillary lymph node. On exam there was focal thickening at the 2:00 position of the left breast 9 cm from the nipple. Targeted ultrasonography confirmed a 1.8 cm hypoechoic mass with a second adjacent 1.1 cm mass both at the 2:00 position of the left breast 9 cm from the nipple. The conglomerate area measured 3.5 cm. There was also an enlarged left axillary lymph node with loss of fatty hilum measuring 1.8 cm.  On family 16 2018 the patient underwent biopsy of the larger left breast mass in question and the suspicious left axillary lymph node. 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  The patient's subsequent history is as detailed below  INTERVAL HISTORY: Tanya [name] returns today for  follow-up and treatment of her estrogen receptor positive breast cancer accompanied by one of her daughters. Today is day 1 cycle 8 of 12 planned cycles of paclitaxel.   REVIEW OF SYSTEMS: Tanya [name] in general tolerates treatment well but she does have some concerns. She had an episode of dizziness at home. She checked her blood pressure and she says it was about 122/90. Her tongue has become discolored. She developed some tingling in her fingertips one time she rubs her hands and it went away. It has not recurred she has up she'll do no tingling or numbness in her fingers or toes at present. She has a small irritation in the gluteal fold which she is treating with Neosporin. She tells me this is getting better. A few days ago her voice felt weak. Also she was having flulike symptoms but those got better as well. She is having some hot flashes. She had an episode of mild ankle swelling which has resolved. Overall he detailed review of systems today was negative except as noted  PAST MEDICAL HISTORY: Past Medical History:  Diagnosis Date  . ANXIETY   . Dyspnea on exertion   . HYPERLIPIDEMIA   . HYPERTENSION, BENIGN ESSENTIAL   . Malignant neoplasm of upper-outer quadrant of left breast in female, estrogen receptor positive ([place]) 05/04/2016  . OBESITY     PAST SURGICAL HISTORY: Past Surgical History:  Procedure Laterality Date  . CESAREAN SECTION    . PORTACATH PLACEMENT Right 06/02/2016   Procedure: INSERTION PORT-A-CATH WITH ULTRASOUND GUIDANCE;  Surgeon: Rolm Bookbinder, MD;  Location: [facility];  Service: General;  Laterality: Right;  . TUBAL LIGATION      FAMILY HISTORY Family History  Problem Relation Age of Onset  . Breast cancer Mother 6  The patient's father had a history of prostate cancer. He died at age 60. The patient's mother died at age 38 from unclear causes. She had a history of breast cancer diagnosed in her 56s. The patient had 4 brothers, 2 sisters.  There is no other history of breast or ovarian cancer in the family to the patient's knowledge  GYNECOLOGIC HISTORY:  No LMP recorded. Patient is postmenopausal. Menarche age 50, first live birth age 62, the patient is [place] P4. She stopped having periods approximately 2006. She did not use hormone replacement. She used oral contraceptives briefly and remotely, with no complications.  SOCIAL HISTORY:  The patient is originally from [name]. 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 The patient's husband [name] was also a professor at [employer], where he taught agriculture oh research. Son [name] works in Land, son [name] works for [employer], daughter [name] is a Education officer, museum, and daughter [name] is also a Education officer, museum, all living in [place]. The patient has 3 grandchildren. She attends [employer]    ADVANCED DIRECTIVES: Not in place   HEALTH MAINTENANCE: Social History  Substance Use Topics  . Smoking status: Never Smoker  . Smokeless tobacco: Never Used  . Alcohol use Yes     Comment: rarely     Colonoscopy:Never  PAP: November 2017  Bone density: Never   Allergies  Allergen Reactions  . Camoquin [Amodiaquine] Itching    Current Outpatient Prescriptions  Medication Sig Dispense Refill  . lidocaine-prilocaine (EMLA) cream Apply to affected area once 30 g 3  . losartan (COZAAR) 50 MG tablet Take 0.5 tablets (25 mg total) by mouth daily. 90 tablet 2  . omeprazole (PRILOSEC) 40 MG capsule Take 1 capsule (40 mg total) by mouth daily. 90 capsule 3  . pravastatin (PRAVACHOL) 40 MG tablet Take 1 tablet (40 mg total) by mouth at bedtime. 90 tablet 2  . prochlorperazine (COMPAZINE) 10 MG tablet Take 1 tablet (10 mg total) by mouth every 6 (six) hours as needed (Nausea or vomiting). 30 tablet 1  . simethicone (MYLICON) 80 MG chewable tablet Chew 80 mg by mouth every 6 (six) hours as needed.     No current facility-administered medications for this visit.      OBJECTIVE: Middle-aged African-American woman In no acute distress  Vitals:   09/29/16 0942  BP: 136/82  Pulse: 91  Resp: 18  Temp: 98.2 F (36.8 C)     Body mass index is 33.4 kg/m.    ECOG FS:1 - Symptomatic but completely ambulatory  Sclerae unicteric, pupils round and equal Oropharynx clear and moist, tongue slightly discolored, but no thrush or other lesions No cervical or supraclavicular adenopathy Lungs no rales or rhonchi Heart regular rate and rhythm Abd soft, nontender, positive bowel sounds MSK no focal spinal tenderness, no upper extremity lymphedema Neuro: nonfocal, well oriented, appropriate affect Breasts: Deferred  LAB RESULTS:  CMP     Component Value Date/Time   NA 141 09/22/2016 1027   K 3.9 09/22/2016 1027   CL 102 01/26/2016 1629   CO2 27 09/22/2016 1027   GLUCOSE 143 (H) 09/22/2016 1027   BUN 6.8 (L) 09/22/2016 1027   CREATININE 0.8 09/22/2016 1027   CALCIUM 9.2 09/22/2016 1027   PROT 6.5 09/22/2016 1027   ALBUMIN 3.6 09/22/2016 1027   AST 20 09/22/2016 1027   ALT 18 09/22/2016 1027   ALKPHOS 69 09/22/2016 1027  BILITOT 0.63 09/22/2016 1027    INo results found for: SPEP, UPEP  CBC    Component Value Date/Time   WBC 4.3 09/29/2016 0931   WBC 7.6 01/23/2014 1431   RBC 3.78 09/29/2016 0931   RBC 5.41 (H) 01/23/2014 1431   HGB 10.4 (L) 09/29/2016 0931   HCT 31.9 (L) 09/29/2016 0931   PLT 278 09/29/2016 0931   MCV 84.4 09/29/2016 0931   MCH 27.5 09/29/2016 0931   MCH 25.0 (L) 01/23/2014 1431   MCHC 32.6 09/29/2016 0931   MCHC 33.8 01/23/2014 1431   RDW 17.1 (H) 09/29/2016 0931   LYMPHSABS 1.2 09/29/2016 0931   MONOABS 0.4 09/29/2016 0931   EOSABS 0.1 09/29/2016 0931   BASOSABS 0.1 09/29/2016 0931        Chemistry      Component Value Date/Time   NA 141 09/22/2016 1027   K 3.9 09/22/2016 1027   CL 102 01/26/2016 1629   CO2 27 09/22/2016 1027   BUN 6.8 (L) 09/22/2016 1027   CREATININE 0.8 09/22/2016 1027        Component Value Date/Time   CALCIUM 9.2 09/22/2016 1027   ALKPHOS 69 09/22/2016 1027   AST 20 09/22/2016 1027   ALT 18 09/22/2016 1027   BILITOT 0.63 09/22/2016 1027       No results found for: LABCA2  No components found for: LABCA125  No results for input(s): INR in the last 168 hours.  Urinalysis    Component Value Date/Time   COLORURINE yellow 06/01/2006 1116   APPEARANCEUR Clear 06/01/2006 1116   LABSPEC 1.010 08/18/2016 1217   PHURINE 6.5 08/18/2016 1217   PHURINE 6.5 06/01/2006 1116   GLUCOSEU Negative 08/18/2016 1217   HGBUR Negative 08/18/2016 1217   HGBUR negative 06/01/2006 1116   BILIRUBINUR Negative 08/18/2016 1217   KETONESUR Negative 08/18/2016 1217   PROTEINUR Negative 08/18/2016 1217   UROBILINOGEN 0.2 08/18/2016 1217   NITRITE Negative 08/18/2016 1217   NITRITE negative 06/01/2006 1116   LEUKOCYTESUR Negative 08/18/2016 1217     STUDIES: No results found.  ELIGIBLE FOR AVAILABLE RESEARCH PROTOCOL: Not a candidate for Prevent (already on a statin drug)  ASSESSMENT: 61 y.o. [place] woman status post left breast upper inner quadrant and left axillary lymph node biopsy every 16 2018, both showing invasive ductal carcinoma, grade 3, clinically T1c N1a, or stage IIB, estrogen receptor positive, progesterone receptor and HER-2 negative, with an MIB-1 of 40%  (a) MRI biopsy 05/25/2016 of a second area of concern in the left breast also shows invasive ductal carcinoma  (1) neoadjuvant anastrozole started 05/11/2016, held at the start of chemotherapy  (2) Mammaprint results shows the tumor to be luminal B, high risk, indicating a need for chemotherapy.  (3) chemotherapy consisting of doxorubicin and cyclophosphamide in dose dense fashion 4 starting 06/09/2016, completed 07/21/2016, followed by weekly paclitaxel 12 started 08/11/2016  (4) breast conserving surgery with targeted axillary dissection to follow  (5) adjuvant radiation as  appropriate  (6) adjuvant anti-estrogens to be continued a minimum of 5 years  PLAN: Hoyle Sauer Is not showing any symptoms suggestive of peripheral neuropathy and her counts are good Marcello Moores so we are proceeding with treatment #8 of 12 planned weekly doses of paclitaxel today.  I think the bit of fainting is that she had as well as the weak voice may be related to dehydration and I asked her to drink at least a quart preferably 2 quarts of water daily.  Unfortunately the change of color  in the tongue goes with the change of call her in her fingers and nails. This will take a long time to resolve but eventually hopefully it will go back to normal. It is a side effect of chemotherapy which aside from the on-site lean has no other consequences  Just to make sure she continues to do well as I'm going to make sure she gets seen with each of her remaining treatments, the next 4 weeks. After that of course she will proceed to MRI and surgery. Marland Kitchen  Chauncey Cruel 09/29/2016 Medical Oncology and Hematology [facility] [address] [place], [place] [address] Tel. [phone]    Fax. [phone]

## 2016-10-01 ENCOUNTER — Telehealth: Payer: Self-pay

## 2016-10-01 NOTE — Telephone Encounter (Signed)
Called and left a message with a appt due to dr Jana Hakim off 10/20/16

## 2016-10-04 ENCOUNTER — Telehealth: Payer: Self-pay | Admitting: *Deleted

## 2016-10-04 NOTE — Telephone Encounter (Signed)
This RN spoke with the patient per her call stating concern that she is not scheduled for visit with upcoming treatment this Thursday ( per last treatment MD stating need to be seen with each treatment ).  This RN informed pt NP is out of the office on 7/26 - and MD schedule is full - but that pt should see this RN after her lab draw for assessment and MD check prior to going to the chemo room.  Tanya [name] verbalized understanding, noted placed on appointment per above.

## 2016-10-06 ENCOUNTER — Other Ambulatory Visit (HOSPITAL_BASED_OUTPATIENT_CLINIC_OR_DEPARTMENT_OTHER): Payer: BLUE CROSS/BLUE SHIELD

## 2016-10-06 ENCOUNTER — Other Ambulatory Visit: Payer: Self-pay | Admitting: *Deleted

## 2016-10-06 ENCOUNTER — Ambulatory Visit: Payer: BLUE CROSS/BLUE SHIELD

## 2016-10-06 ENCOUNTER — Other Ambulatory Visit: Payer: Self-pay | Admitting: Oncology

## 2016-10-06 DIAGNOSIS — Z170 Estrogen receptor positive status [ER+]: Principal | ICD-10-CM

## 2016-10-06 DIAGNOSIS — C773 Secondary and unspecified malignant neoplasm of axilla and upper limb lymph nodes: Secondary | ICD-10-CM

## 2016-10-06 DIAGNOSIS — C50412 Malignant neoplasm of upper-outer quadrant of left female breast: Secondary | ICD-10-CM | POA: Diagnosis not present

## 2016-10-06 LAB — CBC WITH DIFFERENTIAL/PLATELET
BASO%: 1.6 % (ref 0.0–2.0)
Basophils Absolute: 0.1 10*3/uL (ref 0.0–0.1)
EOS%: 2.1 % (ref 0.0–7.0)
Eosinophils Absolute: 0.1 10*3/uL (ref 0.0–0.5)
HCT: 31.2 % — ABNORMAL LOW (ref 34.8–46.6)
HGB: 10.2 g/dL — ABNORMAL LOW (ref 11.6–15.9)
LYMPH#: 1.3 10*3/uL (ref 0.9–3.3)
LYMPH%: 30.4 % (ref 14.0–49.7)
MCH: 27.9 pg (ref 25.1–34.0)
MCHC: 32.7 g/dL (ref 31.5–36.0)
MCV: 85.5 fL (ref 79.5–101.0)
MONO#: 0.4 10*3/uL (ref 0.1–0.9)
MONO%: 8.7 % (ref 0.0–14.0)
NEUT%: 57.2 % (ref 38.4–76.8)
NEUTROS ABS: 2.5 10*3/uL (ref 1.5–6.5)
PLATELETS: 243 10*3/uL (ref 145–400)
RBC: 3.65 10*6/uL — AB (ref 3.70–5.45)
RDW: 17.1 % — ABNORMAL HIGH (ref 11.2–14.5)
WBC: 4.4 10*3/uL (ref 3.9–10.3)

## 2016-10-06 LAB — COMPREHENSIVE METABOLIC PANEL
ALBUMIN: 3.7 g/dL (ref 3.5–5.0)
ALK PHOS: 64 U/L (ref 40–150)
ALT: 17 U/L (ref 0–55)
AST: 21 U/L (ref 5–34)
Anion Gap: 9 mEq/L (ref 3–11)
BUN: 7.5 mg/dL (ref 7.0–26.0)
CALCIUM: 9.3 mg/dL (ref 8.4–10.4)
CO2: 26 mEq/L (ref 22–29)
Chloride: 107 mEq/L (ref 98–109)
Creatinine: 0.7 mg/dL (ref 0.6–1.1)
Glucose: 111 mg/dl (ref 70–140)
POTASSIUM: 3.6 meq/L (ref 3.5–5.1)
Sodium: 141 mEq/L (ref 136–145)
Total Bilirubin: 0.67 mg/dL (ref 0.20–1.20)
Total Protein: 6.5 g/dL (ref 6.4–8.3)

## 2016-10-06 NOTE — Progress Notes (Signed)
Tanya [name]  [phone] [phone]     ID: Tanya [name] DOB: [birth year]  MR#: [id number]  BMW#:[id number]  Patient Care Team: Everrett Coombe, MD as PCP - General Rolm Bookbinder, MD as Consulting Physician (General Surgery) Kaceton Vieau, Virgie Dad, MD as Consulting Physician (Oncology) Kyung Rudd, MD as Consulting Physician (Radiation Oncology) Chauncey Cruel, MD OTHER MD:  CHIEF COMPLAINT: Estrogen receptor positive breast cancer  CURRENT TREATMENT: Neoadjuvant chemotherapy   BREAST CANCER HISTORY: From the original intake note:  Luca had routine bilateral screening mammography at the [facility] 04/19/2016. This showed a possible area of the symmetry in the left breast and a prominent left axillary lymph nodes. On 04/28/2016 she underwent diagnostic left mammography with tomography and left breast ultrasonography. This showed the breast density to be category B. There was an obscured mass in the upper outer left breast and an enlarged left axillary lymph node. On exam there was focal thickening at the 2:00 position of the left breast 9 cm from the nipple. Targeted ultrasonography confirmed a 1.8 cm hypoechoic mass with a second adjacent 1.1 cm mass both at the 2:00 position of the left breast 9 cm from the nipple. The conglomerate area measured 3.5 cm. There was also an enlarged left axillary lymph node with loss of fatty hilum measuring 1.8 cm.  On family 16 2018 the patient underwent biopsy of the larger left breast mass in question and the suspicious left axillary lymph node. 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  The patient's subsequent history is as detailed below  INTERVAL HISTORY: Tanya [name] returns today for  follow-up and treatment of her estrogen receptor positive breast cancer accompanied by one of her daughters. Today is day 1 cycle 8 of 12 planned cycles of paclitaxel.   REVIEW OF SYSTEMS: Maisley in general tolerates treatment well but she does have some concerns. She had an episode of dizziness at home. She checked her blood pressure and she says it was about 122/90. Her tongue has become discolored. She developed some tingling in her fingertips one time she rubs her hands and it went away. It has not recurred she has up she'll do no tingling or numbness in her fingers or toes at present. She has a small irritation in the gluteal fold which she is treating with Neosporin. She tells me this is getting better. A few days ago her voice felt weak. Also she was having flulike symptoms but those got better as well. She is having some hot flashes. She had an episode of mild ankle swelling which has resolved. Overall he detailed review of systems today was negative except as noted  PAST MEDICAL HISTORY: Past Medical History:  Diagnosis Date  . ANXIETY   . Dyspnea on exertion   . HYPERLIPIDEMIA   . HYPERTENSION, BENIGN ESSENTIAL   . Malignant neoplasm of upper-outer quadrant of left breast in female, estrogen receptor positive ([place]) 05/04/2016  . OBESITY     PAST SURGICAL HISTORY: Past Surgical History:  Procedure Laterality Date  . CESAREAN SECTION    . PORTACATH PLACEMENT Right 06/02/2016   Procedure: INSERTION PORT-A-CATH WITH ULTRASOUND GUIDANCE;  Surgeon: Rolm Bookbinder, MD;  Location: [place];  Service: General;  Laterality: Right;  . TUBAL LIGATION      FAMILY HISTORY Family History  Problem Relation Age of Onset  . Breast cancer Mother 65  The patient's father had a history of prostate cancer. He died at age 54. The patient's mother died at age 3 from unclear causes. She had a history of breast cancer diagnosed in her 62s. The patient had 4 brothers, 2 sisters.  There is no other history of breast or ovarian cancer in the family to the patient's knowledge  GYNECOLOGIC HISTORY:  No LMP recorded. Patient is postmenopausal. Menarche age 61, first live birth age 61, the patient is [place] P4. She stopped having periods approximately 2006. She did not use hormone replacement. She used oral contraceptives briefly and remotely, with no complications.  SOCIAL HISTORY:  The patient is originally from [name]. 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 The patient's husband [name] was also a professor at [employer], where he taught agriculture oh research. Son [name] works in Land, son [name] works for [employer], daughter [name] is a Education officer, museum, and daughter [name] is also a Education officer, museum, all living in [place]. The patient has 3 grandchildren. She attends [employer]    ADVANCED DIRECTIVES: Not in place   HEALTH MAINTENANCE: Social History  Substance Use Topics  . Smoking status: Never Smoker  . Smokeless tobacco: Never Used  . Alcohol use Yes     Comment: rarely     Colonoscopy:Never  PAP: November 2017  Bone density: Never   Allergies  Allergen Reactions  . Camoquin [Amodiaquine] Itching    Current Outpatient Prescriptions  Medication Sig Dispense Refill  . lidocaine-prilocaine (EMLA) cream Apply to affected area once 30 g 3  . losartan (COZAAR) 50 MG tablet Take 0.5 tablets (25 mg total) by mouth daily. 90 tablet 2  . omeprazole (PRILOSEC) 40 MG capsule Take 1 capsule (40 mg total) by mouth daily. 90 capsule 3  . pravastatin (PRAVACHOL) 40 MG tablet Take 1 tablet (40 mg total) by mouth at bedtime. 90 tablet 2  . prochlorperazine (COMPAZINE) 10 MG tablet Take 1 tablet (10 mg total) by mouth every 6 (six) hours as needed (Nausea or vomiting). 30 tablet 1  . simethicone (MYLICON) 80 MG chewable tablet Chew 80 mg by mouth every 6 (six) hours as needed.     No current facility-administered medications for this visit.      OBJECTIVE: Middle-aged African-American woman In no acute distress  There were no vitals filed for this visit.   There is no height or weight on file to calculate BMI.    ECOG FS:1 - Symptomatic but completely ambulatory  Sclerae unicteric, pupils round and equal Oropharynx clear and moist, tongue slightly discolored, but no thrush or other lesions No cervical or supraclavicular adenopathy Lungs no rales or rhonchi Heart regular rate and rhythm Abd soft, nontender, positive bowel sounds MSK no focal spinal tenderness, no upper extremity lymphedema Neuro: nonfocal, well oriented, appropriate affect Breasts: Deferred  LAB RESULTS:  CMP     Component Value Date/Time   NA 141 09/29/2016 0931   K 3.8 09/29/2016 0931   CL 102 01/26/2016 1629   CO2 23 09/29/2016 0931   GLUCOSE 100 09/29/2016 0931   BUN 6.9 (L) 09/29/2016 0931   CREATININE 0.7 09/29/2016 0931   CALCIUM 9.5 09/29/2016 0931   PROT 6.8 09/29/2016 0931   ALBUMIN 3.8 09/29/2016 0931   AST 20 09/29/2016 0931   ALT 16 09/29/2016 0931   ALKPHOS 72 09/29/2016 0931   BILITOT 0.67 09/29/2016 0931    INo results  found for: SPEP, UPEP  CBC    Component Value Date/Time   WBC 4.4 10/06/2016 1018   WBC 7.6 01/23/2014 1431   RBC 3.65 (L) 10/06/2016 1018   RBC 5.41 (H) 01/23/2014 1431   HGB 10.2 (L) 10/06/2016 1018   HCT 31.2 (L) 10/06/2016 1018   PLT 243 10/06/2016 1018   MCV 85.5 10/06/2016 1018   MCH 27.9 10/06/2016 1018   MCH 25.0 (L) 01/23/2014 1431   MCHC 32.7 10/06/2016 1018   MCHC 33.8 01/23/2014 1431   RDW 17.1 (H) 10/06/2016 1018   LYMPHSABS 1.3 10/06/2016 1018   MONOABS 0.4 10/06/2016 1018   EOSABS 0.1 10/06/2016 1018   BASOSABS 0.1 10/06/2016 1018        Chemistry      Component Value Date/Time   NA 141 09/29/2016 0931   K 3.8 09/29/2016 0931   CL 102 01/26/2016 1629   CO2 23 09/29/2016 0931   BUN 6.9 (L) 09/29/2016 0931   CREATININE 0.7 09/29/2016 0931      Component Value Date/Time    CALCIUM 9.5 09/29/2016 0931   ALKPHOS 72 09/29/2016 0931   AST 20 09/29/2016 0931   ALT 16 09/29/2016 0931   BILITOT 0.67 09/29/2016 0931       No results found for: LABCA2  No components found for: [id number]  No results for input(s): INR in the last 168 hours.  Urinalysis    Component Value Date/Time   COLORURINE yellow 06/01/2006 1116   APPEARANCEUR Clear 06/01/2006 1116   LABSPEC 1.010 08/18/2016 1217   PHURINE 6.5 08/18/2016 1217   PHURINE 6.5 06/01/2006 1116   GLUCOSEU Negative 08/18/2016 1217   HGBUR Negative 08/18/2016 1217   HGBUR negative 06/01/2006 1116   BILIRUBINUR Negative 08/18/2016 1217   KETONESUR Negative 08/18/2016 1217   PROTEINUR Negative 08/18/2016 1217   UROBILINOGEN 0.2 08/18/2016 1217   NITRITE Negative 08/18/2016 1217   NITRITE negative 06/01/2006 1116   LEUKOCYTESUR Negative 08/18/2016 1217     STUDIES: No results found.  ELIGIBLE FOR AVAILABLE RESEARCH PROTOCOL: Not a candidate for Prevent (already on a statin drug)  ASSESSMENT: 61 y.o. [place] woman status post left breast upper inner quadrant and left axillary lymph node biopsy every 16 2018, both showing invasive ductal carcinoma, grade 3, clinically T1c N1a, or stage IIB, estrogen receptor positive, progesterone receptor and HER-2 negative, with an MIB-1 of 40%  (a) MRI biopsy 05/25/2016 of a second area of concern in the left breast also shows invasive ductal carcinoma  (1) neoadjuvant anastrozole started 05/11/2016, held at the start of chemotherapy  (2) Mammaprint results shows the tumor to be luminal B, high risk, indicating a need for chemotherapy.  (3) chemotherapy consisting of doxorubicin and cyclophosphamide in dose dense fashion 4 starting 06/09/2016, completed 07/21/2016, followed by weekly paclitaxel 12 started 08/11/2016  (4) breast conserving surgery with targeted axillary dissection to follow  (5) adjuvant radiation as appropriate  (6) adjuvant anti-estrogens to  be continued a minimum of 5 years  PLAN: Carolyne Is having more numbness and tingling that I would like even though its very intermittent and somewhat atypical. I really think what is safe at this point is to stop chemotherapy and that is what we did today.  I have set her up for an MRI of the breast sometime next week. I have alerted her surgeon to let him know that she will be ready for surgery anytime after that. Her last chemotherapy was a week ago  She will see  me in about 3 weeks just to tie up loose ends.  Of course she is very motivated and really wanted to proceed but I am afraid I could give her permanent disability if we did so so no more chemotherapy for her.   Chauncey Cruel 10/06/2016 Medical Oncology and Hematology [facility] [address] [place], [place] [address] Tel. [phone]    Fax. [phone]

## 2016-10-06 NOTE — Progress Notes (Signed)
Patient treatment cancelled today, per Dr. Jana Hakim.

## 2016-10-07 ENCOUNTER — Telehealth: Payer: Self-pay | Admitting: *Deleted

## 2016-10-07 NOTE — Telephone Encounter (Signed)
Confirmed appointment for MRI on 10/14/16 at 245pm.

## 2016-10-12 HISTORY — PX: BREAST LUMPECTOMY: SHX2

## 2016-10-13 ENCOUNTER — Ambulatory Visit: Payer: BLUE CROSS/BLUE SHIELD

## 2016-10-13 ENCOUNTER — Other Ambulatory Visit: Payer: BLUE CROSS/BLUE SHIELD

## 2016-10-13 ENCOUNTER — Ambulatory Visit: Payer: BLUE CROSS/BLUE SHIELD | Admitting: Adult Health

## 2016-10-14 ENCOUNTER — Ambulatory Visit (HOSPITAL_COMMUNITY): Admission: RE | Admit: 2016-10-14 | Payer: BLUE CROSS/BLUE SHIELD | Source: Ambulatory Visit

## 2016-10-15 ENCOUNTER — Ambulatory Visit (HOSPITAL_COMMUNITY)
Admission: RE | Admit: 2016-10-15 | Discharge: 2016-10-15 | Disposition: A | Discharge status: Home / Self Care | Payer: BLUE CROSS/BLUE SHIELD | Source: Ambulatory Visit | Attending: Oncology | Admitting: Oncology

## 2016-10-15 DIAGNOSIS — Z170 Estrogen receptor positive status [ER+]: Secondary | ICD-10-CM | POA: Diagnosis not present

## 2016-10-15 DIAGNOSIS — C773 Secondary and unspecified malignant neoplasm of axilla and upper limb lymph nodes: Secondary | ICD-10-CM | POA: Diagnosis not present

## 2016-10-15 DIAGNOSIS — Z853 Personal history of malignant neoplasm of breast: Secondary | ICD-10-CM | POA: Diagnosis not present

## 2016-10-15 DIAGNOSIS — C50412 Malignant neoplasm of upper-outer quadrant of left female breast: Secondary | ICD-10-CM | POA: Diagnosis not present

## 2016-10-15 DIAGNOSIS — C50912 Malignant neoplasm of unspecified site of left female breast: Secondary | ICD-10-CM | POA: Diagnosis not present

## 2016-10-15 DIAGNOSIS — Z9221 Personal history of antineoplastic chemotherapy: Secondary | ICD-10-CM | POA: Diagnosis not present

## 2016-10-15 MED ORDER — GADOBENATE DIMEGLUMINE 529 MG/ML IV SOLN
20.0000 mL | Freq: Once | INTRAVENOUS | Status: AC | PRN
  Administered 2016-10-15: 17 mL via INTRAVENOUS

## 2016-10-16 ENCOUNTER — Other Ambulatory Visit: Payer: Self-pay | Admitting: Oncology

## 2016-10-18 ENCOUNTER — Other Ambulatory Visit: Payer: Self-pay | Admitting: General Surgery

## 2016-10-18 DIAGNOSIS — C50412 Malignant neoplasm of upper-outer quadrant of left female breast: Secondary | ICD-10-CM | POA: Diagnosis not present

## 2016-10-18 DIAGNOSIS — Z170 Estrogen receptor positive status [ER+]: Principal | ICD-10-CM

## 2016-10-19 ENCOUNTER — Other Ambulatory Visit: Payer: Self-pay | Admitting: *Deleted

## 2016-10-19 DIAGNOSIS — C50412 Malignant neoplasm of upper-outer quadrant of left female breast: Secondary | ICD-10-CM

## 2016-10-20 ENCOUNTER — Other Ambulatory Visit: Payer: BLUE CROSS/BLUE SHIELD

## 2016-10-20 ENCOUNTER — Other Ambulatory Visit: Payer: Self-pay | Admitting: General Surgery

## 2016-10-20 ENCOUNTER — Ambulatory Visit: Payer: BLUE CROSS/BLUE SHIELD

## 2016-10-20 ENCOUNTER — Ambulatory Visit: Payer: BLUE CROSS/BLUE SHIELD | Admitting: Adult Health

## 2016-10-20 ENCOUNTER — Ambulatory Visit: Payer: BLUE CROSS/BLUE SHIELD | Admitting: Oncology

## 2016-10-25 ENCOUNTER — Other Ambulatory Visit (HOSPITAL_COMMUNITY): Payer: Self-pay | Admitting: *Deleted

## 2016-10-25 ENCOUNTER — Encounter (HOSPITAL_COMMUNITY): Payer: Self-pay

## 2016-10-25 ENCOUNTER — Encounter (HOSPITAL_COMMUNITY)
Admission: RE | Admit: 2016-10-25 | Discharge: 2016-10-25 | Disposition: A | Discharge status: Home / Self Care | Payer: BLUE CROSS/BLUE SHIELD | Source: Ambulatory Visit | Attending: General Surgery | Admitting: General Surgery

## 2016-10-25 DIAGNOSIS — Z01812 Encounter for preprocedural laboratory examination: Secondary | ICD-10-CM | POA: Insufficient documentation

## 2016-10-25 DIAGNOSIS — C50212 Malignant neoplasm of upper-inner quadrant of left female breast: Secondary | ICD-10-CM | POA: Insufficient documentation

## 2016-10-25 DIAGNOSIS — Z170 Estrogen receptor positive status [ER+]: Secondary | ICD-10-CM | POA: Insufficient documentation

## 2016-10-25 HISTORY — DX: Unspecified osteoarthritis, unspecified site: M19.90

## 2016-10-25 HISTORY — DX: Prediabetes: R73.03

## 2016-10-25 LAB — CBC
HCT: 34.9 % — ABNORMAL LOW (ref 36.0–46.0)
HEMOGLOBIN: 11.3 g/dL — AB (ref 12.0–15.0)
MCH: 26.3 pg (ref 26.0–34.0)
MCHC: 32.4 g/dL (ref 30.0–36.0)
MCV: 81.2 fL (ref 78.0–100.0)
PLATELETS: 208 10*3/uL (ref 150–400)
RBC: 4.3 MIL/uL (ref 3.87–5.11)
RDW: 16.3 % — ABNORMAL HIGH (ref 11.5–15.5)
WBC: 6.1 10*3/uL (ref 4.0–10.5)

## 2016-10-25 LAB — BASIC METABOLIC PANEL
ANION GAP: 8 (ref 5–15)
BUN: 7 mg/dL (ref 6–20)
CALCIUM: 9 mg/dL (ref 8.9–10.3)
CO2: 25 mmol/L (ref 22–32)
Chloride: 107 mmol/L (ref 101–111)
Creatinine, Ser: 0.69 mg/dL (ref 0.44–1.00)
Glucose, Bld: 140 mg/dL — ABNORMAL HIGH (ref 65–99)
Potassium: 3.6 mmol/L (ref 3.5–5.1)
Sodium: 140 mmol/L (ref 135–145)

## 2016-10-25 LAB — GLUCOSE, CAPILLARY: GLUCOSE-CAPILLARY: 130 mg/dL — AB (ref 65–99)

## 2016-10-25 NOTE — Pre-Procedure Instructions (Signed)
Tanya [name]  10/25/2016      [id number] [address], [place] [address] Phone: [phone] Fax: [phone]    Your procedure is scheduled on August 23,2018  Thursday .  Report to [facility] Admitting at 5:30A.M.   Call this number if you have problems the morning of surgery:  [phone]   Remember:  Do not eat food or drink liquids after midnight.   Take these medicines the morning of surgery with A SIP OF WATER none  COMPLETE YOUR 8 oz OF BOOST BREEZE BY 3:30 AM  STOP ASPIRIN,ANTIINFLAMATORIES (IBUPROFEN,ALEVE,MOTRIN,ADVIL,GOODY'S POWDERS),HERBAL SUPPLEMENTS,FISH OIL,AND VITAMINS 5-7 DAYS PRIOR TO SURGERY   Do not wear jewelry, make-up or nail polish.  Do not wear lotions, powders, or perfumes, or deoderant.  Do not shave 48 hours prior to surgery.  Men may shave face and neck.  Do not bring valuables to the hospital.  [facility] is not responsible for any belongings or valuables.  Contacts, dentures or bridgework may not be worn into surgery.  Leave your suitcase in the car.  After surgery it may be brought to your room.  For patients admitted to the hospital, discharge time will be determined by your treatment team.  Patients discharged the day of surgery will not be allowed to drive home.    Special instructions:  Special Instructions: [place] - Preparing for Surgery  Before surgery, you can play an important role.  Because skin is not sterile, your skin needs to be as free of germs as possible.  You can reduce the number of germs on you skin by washing with CHG (chlorahexidine gluconate) soap before surgery.  CHG is an antiseptic cleaner which kills germs and bonds with the skin to continue killing germs even after washing.  Please DO NOT use if you have an allergy to CHG or antibacterial soaps.  If your skin becomes reddened/irritated stop using the CHG and inform your nurse  when you arrive at Short Stay.  Do not shave (including legs and underarms) for at least 48 hours prior to the first CHG shower.  You may shave your face.  Please follow these instructions carefully:   1.  Shower with CHG Soap the night before surgery and the   morning of Surgery.  2.  If you choose to wash your hair, wash your hair first as usual with your normal shampoo.  3.  After you shampoo, rinse your hair and body thoroughly to remove the  Shampoo.  4.  Use CHG as you would any other liquid soap.  You can apply chg directly  to the skin and wash gently with scrungie or a clean washcloth.  5.  Apply the CHG Soap to your body ONLY FROM THE NECK DOWN.   Do not use on open wounds or open sores.  Avoid contact with your eyes,  ears, mouth and genitals (private parts).  Wash genitals (private parts) with your normal soap.  6.  Wash thoroughly, paying special attention to the area where your surgery will be performed.  7.  Thoroughly rinse your body with warm water from the neck down.  8.  DO NOT shower/wash with your normal soap after using and rinsing o  the CHG Soap.  9.  Pat yourself dry with a clean towel.            10.  Wear clean pajamas.  11.  Place clean sheets on your bed the night of your first shower and do not sleep with pets.  Day of Surgery  Do not apply any lotions/deodorants the morning of surgery.  Please wear clean clothes to the hospital/surgery center.   Please read over the following fact sheets that you were given. Surgical Site Infection Prevention

## 2016-10-27 ENCOUNTER — Ambulatory Visit: Payer: BLUE CROSS/BLUE SHIELD

## 2016-10-27 ENCOUNTER — Ambulatory Visit (HOSPITAL_BASED_OUTPATIENT_CLINIC_OR_DEPARTMENT_OTHER): Payer: BLUE CROSS/BLUE SHIELD | Admitting: Oncology

## 2016-10-27 ENCOUNTER — Other Ambulatory Visit: Payer: BLUE CROSS/BLUE SHIELD

## 2016-10-27 ENCOUNTER — Other Ambulatory Visit: Payer: Self-pay | Admitting: Student in an Organized Health Care Education/Training Program

## 2016-10-27 VITALS — BP 137/83 | HR 95 | Temp 98.6°F | Resp 18 | Ht 63.0 in | Wt 184.6 lb

## 2016-10-27 DIAGNOSIS — C50412 Malignant neoplasm of upper-outer quadrant of left female breast: Secondary | ICD-10-CM

## 2016-10-27 DIAGNOSIS — Z170 Estrogen receptor positive status [ER+]: Secondary | ICD-10-CM

## 2016-10-27 DIAGNOSIS — C773 Secondary and unspecified malignant neoplasm of axilla and upper limb lymph nodes: Secondary | ICD-10-CM | POA: Diagnosis not present

## 2016-10-27 NOTE — Progress Notes (Signed)
[place]  [phone] [phone]     ID: Tanya [name] DOB: 1955-10-26  MR#: [id number]  NID#:[id number]  Patient Care Team: Everrett Coombe, MD as PCP - General Rolm Bookbinder, MD as Consulting Physician (General Surgery) Macil Crady, Virgie Dad, MD as Consulting Physician (Oncology) Kyung Rudd, MD as Consulting Physician (Radiation Oncology) Chauncey Cruel, MD OTHER MD:  CHIEF COMPLAINT: Estrogen receptor positive breast cancer  CURRENT TREATMENT: Definitive surgery pending   BREAST CANCER HISTORY: From the original intake note:  Tanya [name] had routine bilateral screening mammography at the [facility] 04/19/2016. This showed a possible area of the symmetry in the left breast and a prominent left axillary lymph nodes. On 04/28/2016 she underwent diagnostic left mammography with tomography and left breast ultrasonography. This showed the breast density to be category B. There was an obscured mass in the upper outer left breast and an enlarged left axillary lymph node. On exam there was focal thickening at the 2:00 position of the left breast 9 cm from the nipple. Targeted ultrasonography confirmed a 1.8 cm hypoechoic mass with a second adjacent 1.1 cm mass both at the 2:00 position of the left breast 9 cm from the nipple. The conglomerate area measured 3.5 cm. There was also an enlarged left axillary lymph node with loss of fatty hilum measuring 1.8 cm.  On family 16 2018 the patient underwent biopsy of the larger left breast mass in question and the suspicious left axillary lymph node. 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  The patient's subsequent history is as detailed below  INTERVAL HISTORY: Tanya [name] returns today for  follow-up of her estrogen receptor positive breast cancer accompanied by her husband. Her neoadjuvant chemotherapy was stopped after 8 cycles of paclitaxel because of the development of neuropathy which was mild intermittent but nevertheless of concern.  She then had a breast MRI 10/15/2016 showing no evidence of residual enhancement at the 2 sites of previously biopsied cancer in the left breast and decreased size of the known metastatic left axillary lymph nodes. The right breast was benign.  Her case was presented at the multidisciplinary breast cancer conference 10/19/2016. At that time it was proposed that lumpectomy with targeted axillary dissection was appropriate. Her port will be removed at the same time as that surgery. This is scheduled for 11/03/2016.  REVIEW OF SYSTEMS: Tanya [name] is still having a variety of symptoms which she relates to the chemotherapy but they're all improved. She still a little dizzy at times. 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 She has rare episodes of nausea, no vomiting. She has a squeezing or stretching pain in the left breast at times. This is again very intermittent. Her face is darkened and this concerns her. The tingling in her fingertips but also in her arms in other parts of her body is very intermittent and is also fading. She thinks her cheeks look a little bit swollen. Occasionally she has flulike feelings. Overall however she is feeling much better than when she was under treatment. A detailed review of systems today was otherwise stable  PAST MEDICAL HISTORY: Past Medical History:  Diagnosis Date  . ANXIETY   . Arthritis   . Dyspnea on exertion   . HYPERLIPIDEMIA   . HYPERTENSION, BENIGN  ESSENTIAL   . Malignant neoplasm of upper-outer quadrant of left breast in female, estrogen receptor positive ([place]) 05/04/2016  . OBESITY   . Pre-diabetes    no medications    PAST SURGICAL HISTORY: Past Surgical  History:  Procedure Laterality Date  . CESAREAN SECTION    . PORTACATH PLACEMENT Right 06/02/2016   Procedure: INSERTION PORT-A-CATH WITH ULTRASOUND GUIDANCE;  Surgeon: Rolm Bookbinder, MD;  Location: [facility];  Service: General;  Laterality: Right;  . TUBAL LIGATION      FAMILY HISTORY Family History  Problem Relation Age of Onset  . Breast cancer Mother 41  The patient's father had a history of prostate cancer. He died at age 71. The patient's mother died at age 38 from unclear causes. She had a history of breast cancer diagnosed in her 45s. The patient had 4 brothers, 2 sisters. There is no other history of breast or ovarian cancer in the family to the patient's knowledge  GYNECOLOGIC HISTORY:  No LMP recorded. Patient is postmenopausal. Menarche age 61, first live birth age 30, the patient is [place] P4. She stopped having periods approximately 2006. She did not use hormone replacement. She used oral contraceptives briefly and remotely, with no complications.  SOCIAL HISTORY:  The patient is originally from [name]. 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 The patient's husband [name] was also a professor at [employer], where he taught agriculture oh research. Son [name] works in Land, son [name] works for [employer], daughter [name] is a Education officer, museum, and daughter [name] is also a Education officer, museum, all living in [place]. The patient has 3 grandchildren. She attends [employer]    ADVANCED DIRECTIVES: Not in place   HEALTH MAINTENANCE: Social History  Substance Use Topics  . Smoking status: Never Smoker  . Smokeless tobacco: Never Used  . Alcohol use Yes     Comment: rarely     Colonoscopy:Never  PAP: November 2017  Bone density: Never   Allergies  Allergen Reactions  . Ascorbic Acid & Derivatives Other (See Comments)    Mouth sores (Vitamin C) with higher doses  . Camoquin [Amodiaquine] Itching    Current Outpatient  Prescriptions  Medication Sig Dispense Refill  . acetaminophen (TYLENOL) 500 MG tablet Take 1,000 mg by mouth every 6 (six) hours as needed (for pain.).    Marland Kitchen lidocaine-prilocaine (EMLA) cream Apply to affected area once (Patient taking differently: Apply 1 application topically daily as needed. Apply to affected area once) 30 g 3  . losartan (COZAAR) 50 MG tablet Take 37.5 mg by mouth at bedtime.  90 tablet 2  . Multiple Vitamin (MULTIVITAMIN WITH MINERALS) TABS tablet Take 1 tablet by mouth 3 (three) times a week. One-A-Day Women's 50+    . neomycin-bacitracin-polymyxin (NEOSPORIN) ointment Apply 1 application topically 2 (two) times daily as needed (for boil wound care).    Marland Kitchen omeprazole (PRILOSEC) 40 MG capsule Take 1 capsule (40 mg total) by mouth daily. (Patient not taking: Reported on 10/21/2016) 90 capsule 3  . pravastatin (PRAVACHOL) 40 MG tablet Take 1 tablet (40 mg total) by mouth at bedtime. 90 tablet 2  . prochlorperazine (COMPAZINE) 10 MG tablet Take 1 tablet (10 mg total) by mouth every 6 (six) hours as needed (Nausea or vomiting). (Patient not taking: Reported on 10/21/2016) 30 tablet 1  . simethicone (MYLICON) 80 MG chewable tablet Chew 80 mg by mouth every 6 (six) hours as needed (for gas).      No  current facility-administered medications for this visit.     OBJECTIVE: Middle-aged African-American woman Who appears stated age  36:   10/27/16 0907  BP: 137/83  Pulse: 95  Resp: 18  Temp: 98.6 F (37 C)  SpO2: 100%     Body mass index is 32.7 kg/m.    ECOG FS:1 - Symptomatic but completely ambulatory  Sclerae unicteric, EOMs intact Oropharynx clear and moist No cervical or supraclavicular adenopathy Lungs no rales or rhonchi Heart regular rate and rhythm Abd soft, nontender, positive bowel sounds MSK no focal spinal tenderness, no upper extremity lymphedema Neuro: nonfocal, well oriented, appropriate affect Breasts: I do not palpate a mass in either breast or  either axilla.  LAB RESULTS:  CMP     Component Value Date/Time   NA 140 10/25/2016 1050   NA 141 10/06/2016 1018   K 3.6 10/25/2016 1050   K 3.6 10/06/2016 1018   CL 107 10/25/2016 1050   CO2 25 10/25/2016 1050   CO2 26 10/06/2016 1018   GLUCOSE 140 (H) 10/25/2016 1050   GLUCOSE 111 10/06/2016 1018   BUN 7 10/25/2016 1050   BUN 7.5 10/06/2016 1018   CREATININE 0.69 10/25/2016 1050   CREATININE 0.7 10/06/2016 1018   CALCIUM 9.0 10/25/2016 1050   CALCIUM 9.3 10/06/2016 1018   PROT 6.5 10/06/2016 1018   ALBUMIN 3.7 10/06/2016 1018   AST 21 10/06/2016 1018   ALT 17 10/06/2016 1018   ALKPHOS 64 10/06/2016 1018   BILITOT 0.67 10/06/2016 1018   GFRNONAA >60 10/25/2016 1050   GFRAA >60 10/25/2016 1050    INo results found for: SPEP, UPEP  CBC    Component Value Date/Time   WBC 6.1 10/25/2016 1050   RBC 4.30 10/25/2016 1050   HGB 11.3 (L) 10/25/2016 1050   HGB 10.2 (L) 10/06/2016 1018   HCT 34.9 (L) 10/25/2016 1050   HCT 31.2 (L) 10/06/2016 1018   PLT 208 10/25/2016 1050   PLT 243 10/06/2016 1018   MCV 81.2 10/25/2016 1050   MCV 85.5 10/06/2016 1018   MCH 26.3 10/25/2016 1050   MCHC 32.4 10/25/2016 1050   RDW 16.3 (H) 10/25/2016 1050   RDW 17.1 (H) 10/06/2016 1018   LYMPHSABS 1.3 10/06/2016 1018   MONOABS 0.4 10/06/2016 1018   EOSABS 0.1 10/06/2016 1018   BASOSABS 0.1 10/06/2016 1018        Chemistry      Component Value Date/Time   NA 140 10/25/2016 1050   NA 141 10/06/2016 1018   K 3.6 10/25/2016 1050   K 3.6 10/06/2016 1018   CL 107 10/25/2016 1050   CO2 25 10/25/2016 1050   CO2 26 10/06/2016 1018   BUN 7 10/25/2016 1050   BUN 7.5 10/06/2016 1018   CREATININE 0.69 10/25/2016 1050   CREATININE 0.7 10/06/2016 1018      Component Value Date/Time   CALCIUM 9.0 10/25/2016 1050   CALCIUM 9.3 10/06/2016 1018   ALKPHOS 64 10/06/2016 1018   AST 21 10/06/2016 1018   ALT 17 10/06/2016 1018   BILITOT 0.67 10/06/2016 1018       No results found for:  LABCA2  No components found for: LABCA125  No results for input(s): INR in the last 168 hours.  Urinalysis    Component Value Date/Time   COLORURINE yellow 06/01/2006 1116   APPEARANCEUR Clear 06/01/2006 1116   LABSPEC 1.010 08/18/2016 1217   PHURINE 6.5 08/18/2016 1217   PHURINE 6.5 06/01/2006 1116   GLUCOSEU Negative  08/18/2016 1217   HGBUR Negative 08/18/2016 1217   HGBUR negative 06/01/2006 1116   BILIRUBINUR Negative 08/18/2016 1217   KETONESUR Negative 08/18/2016 1217   PROTEINUR Negative 08/18/2016 1217   UROBILINOGEN 0.2 08/18/2016 1217   NITRITE Negative 08/18/2016 1217   NITRITE negative 06/01/2006 1116   LEUKOCYTESUR Negative 08/18/2016 1217     STUDIES: Mr Breast Bilateral W Wo Contrast  Result Date: 10/17/2016 CLINICAL DATA:  61 year old female presenting for evaluation of known left breast cancer status post neoadjuvant chemotherapy. LABS:  Not applicable. EXAM: BILATERAL BREAST MRI WITH AND WITHOUT CONTRAST TECHNIQUE: Multiplanar, multisequence MR images of both breasts were obtained prior to and following the intravenous administration of 17 ml of MultiHance. THREE-DIMENSIONAL MR IMAGE RENDERING ON INDEPENDENT WORKSTATION: Three-dimensional MR images were rendered by post-processing of the original MR data on an independent workstation. The three-dimensional MR images were interpreted, and findings are reported in the following complete MRI report for this study. Three dimensional images were evaluated at the independent DynaCad workstation COMPARISON:  Previous exam(s). FINDINGS: Breast composition: b. Scattered fibroglandular tissue. Background parenchymal enhancement: Minimal. Right breast: No mass or abnormal enhancement. Left breast: Susceptibility artifact from the biopsy marking clip is seen at the site of known cancer in the upper-outer quadrant of the left breast. There is no significant residual enhancement at this site. No evidence of residual enhancement is  identified along the chest wall and the posterior lateral left breast at the site of the prior MRI guided biopsy which demonstrated a second site of invasive mammary carcinoma. Lymph nodes: There is decrease in size of the most abnormal appearing lymph node in the left axilla previously measuring 1.7 x 1.3 cm in axial plane, currently measuring 1.1 x 0.6 cm. This corresponds with the lymph node which was previously biopsied showing metastatic disease. No abnormal right axillary lymph nodes. Ancillary findings:  None. IMPRESSION: 1. There is no evidence of residual enhancement at the 2 sites of biopsied cancer in the left breast status post neoadjuvant chemotherapy. 2. Decrease in size of the known metastatic left axillary lymph node. 3. No MRI evidence of malignancy in the right breast or additional sites of disease in the left breast. RECOMMENDATION: Continue treatment plan. BI-RADS CATEGORY  6: Known biopsy-proven malignancy. Electronically Signed   By: Ammie Ferrier M.D.   On: 10/17/2016 11:22    ELIGIBLE FOR AVAILABLE RESEARCH PROTOCOL: Not a candidate for Prevent (already on a statin drug)  ASSESSMENT: 61 y.o. [place] woman status post left breast upper inner quadrant and left axillary lymph node biopsy every 16 2018, both showing invasive ductal carcinoma, grade 3, clinically T1c N1a, or stage IIB, estrogen receptor positive, progesterone receptor and HER-2 negative, with an MIB-1 of 40%  (a) MRI biopsy 05/25/2016 of a second area of concern in the left breast also shows invasive ductal carcinoma  (1) neoadjuvant anastrozole started 05/11/2016, held at the start of chemotherapy  (2) Mammaprint results shows the tumor to be luminal B, high risk, indicating a need for chemotherapy.  (3) chemotherapy consisting of doxorubicin and cyclophosphamide in dose dense fashion 4 starting 06/09/2016, completed 07/21/2016, followed by weekly paclitaxel started 08/11/2016  (a) paclitaxel stopped after  cycle 8 because of neuropathy; last dose 09/29/2016  (4) breast conserving surgery with targeted axillary dissection 11/03/2016  (5) adjuvant radiation as appropriate  (6) adjuvant anti-estrogens to be continued a minimum of 5 years  PLAN: I spent approximately 30 minutes with Chrys Racer and her husband reviewing her multiple questions or  concerns. She is worried that she is not taking something right now for her cancer. However I reassured her that the chemotherapy is still working and that is why she still having some residual side effects from it. Gradually those side effects are going to fade away and she will become more "normal".  Her hair is coming in very thin. I reassured her that eventually it will be quite strong.  She was wondering if she should use a bleed seeing agent of some sort because her face has become a bit darker from the chemotherapy. I strongly urged her not to do that. She simply should stay away from the son and weight. After a year to her skin tone will be back to what it was before.  She is concerned because she is having some discomfort including extra sensitivity and soreness in the left breast. I reassured her that pain in the breast, mastalgia, is not related to breast cancer, which itself is painless in the breast.  Finally we went over her MRI results in detail. The results in the breast are very good. The results in the axilla did not show complete resolution of the lymph nodes but they are smaller.  I anticipate a good pathologic report once she has her surgery. That is scheduled for 11/03/2016.  I will see her mid-September. She should have recovered from her surgery by then and we will have reviewed her case again in conference. She will then be ready to proceed to radiation  She knows to call for any problems that may develop before the next visit here.  Marland Kitchen  Chauncey Cruel 10/27/2016 Medical Oncology and Hematology [facility] [address] [place], [place] [address] Tel. [phone]    Fax. [phone]

## 2016-11-01 ENCOUNTER — Other Ambulatory Visit: Payer: Self-pay | Admitting: General Surgery

## 2016-11-01 ENCOUNTER — Ambulatory Visit
Admission: RE | Admit: 2016-11-01 | Discharge: 2016-11-01 | Disposition: A | Discharge status: Home / Self Care | Payer: BLUE CROSS/BLUE SHIELD | Source: Ambulatory Visit | Attending: General Surgery | Admitting: General Surgery

## 2016-11-01 DIAGNOSIS — C50412 Malignant neoplasm of upper-outer quadrant of left female breast: Secondary | ICD-10-CM

## 2016-11-01 DIAGNOSIS — R590 Localized enlarged lymph nodes: Secondary | ICD-10-CM | POA: Diagnosis not present

## 2016-11-01 DIAGNOSIS — Z170 Estrogen receptor positive status [ER+]: Principal | ICD-10-CM

## 2016-11-01 DIAGNOSIS — R928 Other abnormal and inconclusive findings on diagnostic imaging of breast: Secondary | ICD-10-CM | POA: Diagnosis not present

## 2016-11-02 MED ORDER — ACETAMINOPHEN 500 MG PO TABS
1000.0000 mg | ORAL_TABLET | ORAL | Status: AC
  Administered 2016-11-03: 1000 mg via ORAL
  Filled 2016-11-02: qty 2

## 2016-11-02 MED ORDER — GABAPENTIN 300 MG PO CAPS
300.0000 mg | ORAL_CAPSULE | ORAL | Status: AC
  Administered 2016-11-03: 300 mg via ORAL
  Filled 2016-11-02: qty 1

## 2016-11-02 MED ORDER — CEFAZOLIN SODIUM-DEXTROSE 2-4 GM/100ML-% IV SOLN
2.0000 g | INTRAVENOUS | Status: AC
  Administered 2016-11-03: 2 g via INTRAVENOUS
  Filled 2016-11-02: qty 100

## 2016-11-02 MED ORDER — CELECOXIB 200 MG PO CAPS
200.0000 mg | ORAL_CAPSULE | ORAL | Status: AC
  Administered 2016-11-03: 200 mg via ORAL
  Filled 2016-11-02: qty 1

## 2016-11-03 ENCOUNTER — Ambulatory Visit
Admission: RE | Admit: 2016-11-03 | Discharge: 2016-11-03 | Disposition: A | Discharge status: Home / Self Care | Payer: BLUE CROSS/BLUE SHIELD | Source: Ambulatory Visit | Attending: General Surgery | Admitting: General Surgery

## 2016-11-03 ENCOUNTER — Ambulatory Visit (HOSPITAL_COMMUNITY): Payer: BLUE CROSS/BLUE SHIELD | Admitting: Certified Registered"

## 2016-11-03 ENCOUNTER — Encounter (HOSPITAL_COMMUNITY)
Admission: RE | Admit: 2016-11-03 | Discharge: 2016-11-03 | Disposition: A | Discharge status: Home / Self Care | Payer: BLUE CROSS/BLUE SHIELD | Source: Ambulatory Visit | Attending: General Surgery | Admitting: General Surgery

## 2016-11-03 ENCOUNTER — Ambulatory Visit (HOSPITAL_COMMUNITY)
Admission: RE | Admit: 2016-11-03 | Discharge: 2016-11-03 | Disposition: A | Discharge status: Home / Self Care | Payer: BLUE CROSS/BLUE SHIELD | Source: Ambulatory Visit | Attending: General Surgery | Admitting: General Surgery

## 2016-11-03 ENCOUNTER — Encounter (HOSPITAL_COMMUNITY)
Admission: RE | Disposition: A | Discharge status: Home / Self Care | Payer: Self-pay | Source: Ambulatory Visit | Attending: General Surgery

## 2016-11-03 DIAGNOSIS — C50412 Malignant neoplasm of upper-outer quadrant of left female breast: Secondary | ICD-10-CM | POA: Diagnosis not present

## 2016-11-03 DIAGNOSIS — F419 Anxiety disorder, unspecified: Secondary | ICD-10-CM | POA: Diagnosis not present

## 2016-11-03 DIAGNOSIS — Z888 Allergy status to other drugs, medicaments and biological substances status: Secondary | ICD-10-CM | POA: Insufficient documentation

## 2016-11-03 DIAGNOSIS — C773 Secondary and unspecified malignant neoplasm of axilla and upper limb lymph nodes: Secondary | ICD-10-CM | POA: Diagnosis not present

## 2016-11-03 DIAGNOSIS — E785 Hyperlipidemia, unspecified: Secondary | ICD-10-CM | POA: Insufficient documentation

## 2016-11-03 DIAGNOSIS — R928 Other abnormal and inconclusive findings on diagnostic imaging of breast: Secondary | ICD-10-CM | POA: Diagnosis not present

## 2016-11-03 DIAGNOSIS — I10 Essential (primary) hypertension: Secondary | ICD-10-CM | POA: Insufficient documentation

## 2016-11-03 DIAGNOSIS — E669 Obesity, unspecified: Secondary | ICD-10-CM | POA: Insufficient documentation

## 2016-11-03 DIAGNOSIS — Z170 Estrogen receptor positive status [ER+]: Principal | ICD-10-CM

## 2016-11-03 DIAGNOSIS — C779 Secondary and unspecified malignant neoplasm of lymph node, unspecified: Secondary | ICD-10-CM | POA: Diagnosis not present

## 2016-11-03 DIAGNOSIS — Z79899 Other long term (current) drug therapy: Secondary | ICD-10-CM | POA: Insufficient documentation

## 2016-11-03 DIAGNOSIS — N6012 Diffuse cystic mastopathy of left breast: Secondary | ICD-10-CM | POA: Diagnosis not present

## 2016-11-03 DIAGNOSIS — C50912 Malignant neoplasm of unspecified site of left female breast: Secondary | ICD-10-CM | POA: Diagnosis not present

## 2016-11-03 DIAGNOSIS — Z452 Encounter for adjustment and management of vascular access device: Secondary | ICD-10-CM | POA: Diagnosis not present

## 2016-11-03 DIAGNOSIS — G8918 Other acute postprocedural pain: Secondary | ICD-10-CM | POA: Diagnosis not present

## 2016-11-03 DIAGNOSIS — R229 Localized swelling, mass and lump, unspecified: Secondary | ICD-10-CM | POA: Diagnosis not present

## 2016-11-03 HISTORY — PX: BREAST LUMPECTOMY WITH RADIOACTIVE SEED AND SENTINEL LYMPH NODE BIOPSY: SHX6550

## 2016-11-03 HISTORY — PX: PORT-A-CATH REMOVAL: SHX5289

## 2016-11-03 SURGERY — BREAST LUMPECTOMY WITH RADIOACTIVE SEED AND SENTINEL LYMPH NODE BIOPSY
Anesthesia: General | Site: Chest | Laterality: Right

## 2016-11-03 MED ORDER — CHLORHEXIDINE GLUCONATE CLOTH 2 % EX PADS: 6.0000 | MEDICATED_PAD | Freq: Once | CUTANEOUS | Status: DC

## 2016-11-03 MED ORDER — ONDANSETRON HCL 4 MG/2ML IJ SOLN
INTRAMUSCULAR | Status: DC | PRN
  Administered 2016-11-03: 4 mg via INTRAVENOUS

## 2016-11-03 MED ORDER — EPHEDRINE SULFATE 50 MG/ML IJ SOLN
INTRAMUSCULAR | Status: DC | PRN
  Administered 2016-11-03: 10 mg via INTRAVENOUS
  Administered 2016-11-03: 15 mg via INTRAVENOUS

## 2016-11-03 MED ORDER — ONDANSETRON HCL 4 MG/2ML IJ SOLN
4.0000 mg | Freq: Four times a day (QID) | INTRAMUSCULAR | Status: AC | PRN
  Administered 2016-11-03: 4 mg via INTRAVENOUS

## 2016-11-03 MED ORDER — BUPIVACAINE-EPINEPHRINE (PF) 0.25% -1:200000 IJ SOLN
INTRAMUSCULAR | Status: AC
  Filled 2016-11-03: qty 30

## 2016-11-03 MED ORDER — TECHNETIUM TC 99M SULFUR COLLOID FILTERED
1.0000 | Freq: Once | INTRAVENOUS | Status: AC | PRN
  Administered 2016-11-03: 1 via INTRADERMAL

## 2016-11-03 MED ORDER — LIDOCAINE HCL (CARDIAC) 20 MG/ML IV SOLN
INTRAVENOUS | Status: DC | PRN
  Administered 2016-11-03: 60 mg via INTRAVENOUS

## 2016-11-03 MED ORDER — OXYCODONE HCL 5 MG PO TABS: 5.0000 mg | ORAL_TABLET | Freq: Four times a day (QID) | ORAL | 0 refills | Status: DC | PRN

## 2016-11-03 MED ORDER — MIDAZOLAM HCL 2 MG/2ML IJ SOLN
INTRAMUSCULAR | Status: AC
  Filled 2016-11-03: qty 2

## 2016-11-03 MED ORDER — PHENYLEPHRINE 40 MCG/ML (10ML) SYRINGE FOR IV PUSH (FOR BLOOD PRESSURE SUPPORT)
PREFILLED_SYRINGE | INTRAVENOUS | Status: AC
  Filled 2016-11-03: qty 10

## 2016-11-03 MED ORDER — LACTATED RINGERS IV SOLN
INTRAVENOUS | Status: DC | PRN
  Administered 2016-11-03 (×2): via INTRAVENOUS

## 2016-11-03 MED ORDER — SODIUM CHLORIDE 0.9 % IJ SOLN
INTRAMUSCULAR | Status: AC
  Filled 2016-11-03: qty 10

## 2016-11-03 MED ORDER — PROPOFOL 10 MG/ML IV BOLUS
INTRAVENOUS | Status: DC | PRN
  Administered 2016-11-03: 180 mg via INTRAVENOUS

## 2016-11-03 MED ORDER — FENTANYL CITRATE (PF) 100 MCG/2ML IJ SOLN: 25.0000 ug | INTRAMUSCULAR | Status: DC | PRN

## 2016-11-03 MED ORDER — BUPIVACAINE-EPINEPHRINE (PF) 0.5% -1:200000 IJ SOLN
INTRAMUSCULAR | Status: DC | PRN
  Administered 2016-11-03: 25 mL via PERINEURAL

## 2016-11-03 MED ORDER — OXYCODONE HCL 5 MG PO TABS: 5.0000 mg | ORAL_TABLET | Freq: Once | ORAL | Status: DC | PRN

## 2016-11-03 MED ORDER — EPHEDRINE 5 MG/ML INJ
INTRAVENOUS | Status: AC
  Filled 2016-11-03: qty 10

## 2016-11-03 MED ORDER — SODIUM CHLORIDE 0.9 % IJ SOLN
INTRAVENOUS | Status: DC | PRN
  Administered 2016-11-03: 5 mL

## 2016-11-03 MED ORDER — FENTANYL CITRATE (PF) 100 MCG/2ML IJ SOLN
INTRAMUSCULAR | Status: DC | PRN
  Administered 2016-11-03 (×3): 50 ug via INTRAVENOUS

## 2016-11-03 MED ORDER — BUPIVACAINE-EPINEPHRINE 0.25% -1:200000 IJ SOLN
INTRAMUSCULAR | Status: DC | PRN
  Administered 2016-11-03: 8 mL

## 2016-11-03 MED ORDER — 0.9 % SODIUM CHLORIDE (POUR BTL) OPTIME
TOPICAL | Status: DC | PRN
  Administered 2016-11-03: 1000 mL

## 2016-11-03 MED ORDER — DEXAMETHASONE SODIUM PHOSPHATE 10 MG/ML IJ SOLN
INTRAMUSCULAR | Status: DC | PRN
  Administered 2016-11-03: 10 mg via INTRAVENOUS

## 2016-11-03 MED ORDER — METHYLENE BLUE 0.5 % INJ SOLN
INTRAVENOUS | Status: AC
  Filled 2016-11-03: qty 10

## 2016-11-03 MED ORDER — PROPOFOL 10 MG/ML IV BOLUS
INTRAVENOUS | Status: AC
  Filled 2016-11-03: qty 20

## 2016-11-03 MED ORDER — METHOCARBAMOL 750 MG PO TABS: 750.0000 mg | ORAL_TABLET | Freq: Four times a day (QID) | ORAL | 0 refills | Status: DC | PRN

## 2016-11-03 MED ORDER — ONDANSETRON HCL 4 MG/2ML IJ SOLN
INTRAMUSCULAR | Status: DC
Start: 2016-11-03 — End: 2016-11-03
  Filled 2016-11-03: qty 2

## 2016-11-03 MED ORDER — FENTANYL CITRATE (PF) 250 MCG/5ML IJ SOLN
INTRAMUSCULAR | Status: AC
  Filled 2016-11-03: qty 5

## 2016-11-03 MED ORDER — PHENYLEPHRINE HCL 10 MG/ML IJ SOLN
INTRAMUSCULAR | Status: DC | PRN
  Administered 2016-11-03: 80 ug via INTRAVENOUS
  Administered 2016-11-03: 120 ug via INTRAVENOUS

## 2016-11-03 MED ORDER — MIDAZOLAM HCL 5 MG/5ML IJ SOLN
INTRAMUSCULAR | Status: DC | PRN
  Administered 2016-11-03: 2 mg via INTRAVENOUS

## 2016-11-03 MED ORDER — OXYCODONE HCL 5 MG/5ML PO SOLN: 5.0000 mg | Freq: Once | ORAL | Status: DC | PRN

## 2016-11-03 MED ORDER — LIDOCAINE 2% (20 MG/ML) 5 ML SYRINGE
INTRAMUSCULAR | Status: AC
  Filled 2016-11-03: qty 5

## 2016-11-03 SURGICAL SUPPLY — 59 items
ADH SKN CLS APL DERMABOND .7 (GAUZE/BANDAGES/DRESSINGS) ×1
APPLIER CLIP 9.375 MED OPEN (MISCELLANEOUS) ×3
APR CLP MED 9.3 20 MLT OPN (MISCELLANEOUS) ×2
BINDER BREAST LRG (GAUZE/BANDAGES/DRESSINGS) IMPLANT
BINDER BREAST XLRG (GAUZE/BANDAGES/DRESSINGS) ×3 IMPLANT
BLADE SURG 15 STRL LF DISP TIS (BLADE) ×2 IMPLANT
BLADE SURG 15 STRL SS (BLADE) ×2
CANISTER SUCT 3000ML PPV (MISCELLANEOUS) ×3 IMPLANT
CHLORAPREP W/TINT 10.5 ML (MISCELLANEOUS) ×3 IMPLANT
CHLORAPREP W/TINT 26ML (MISCELLANEOUS) ×3 IMPLANT
CLIP APPLIE 9.375 MED OPEN (MISCELLANEOUS) ×2 IMPLANT
COVER PROBE W GEL 5X96 (DRAPES) ×3 IMPLANT
COVER SURGICAL LIGHT HANDLE (MISCELLANEOUS) ×3 IMPLANT
DECANTER SPIKE VIAL GLASS SM (MISCELLANEOUS) ×3 IMPLANT
DERMABOND ADVANCED (GAUZE/BANDAGES/DRESSINGS) ×1
DERMABOND ADVANCED .7 DNX12 (GAUZE/BANDAGES/DRESSINGS) ×2 IMPLANT
DEVICE DUBIN SPECIMEN MAMMOGRA (MISCELLANEOUS) ×3 IMPLANT
DRAPE CHEST BREAST 15X10 FENES (DRAPES) ×3 IMPLANT
DRAPE LAPAROTOMY 100X72 PEDS (DRAPES) IMPLANT
DRAPE UTILITY XL STRL (DRAPES) ×3 IMPLANT
ELECT CAUTERY BLADE 6.4 (BLADE) ×3 IMPLANT
ELECT COATED BLADE 2.86 ST (ELECTRODE) ×3 IMPLANT
ELECT REM PT RETURN 9FT ADLT (ELECTROSURGICAL) ×3
ELECTRODE REM PT RTRN 9FT ADLT (ELECTROSURGICAL) ×2 IMPLANT
GAUZE SPONGE 4X4 16PLY XRAY LF (GAUZE/BANDAGES/DRESSINGS) ×3 IMPLANT
GLOVE BIO SURGEON STRL SZ7 (GLOVE) ×3 IMPLANT
GLOVE BIOGEL PI IND STRL 7.5 (GLOVE) ×2 IMPLANT
GLOVE BIOGEL PI INDICATOR 7.5 (GLOVE) ×1
GOWN STRL REUS W/ TWL LRG LVL3 (GOWN DISPOSABLE) ×4 IMPLANT
GOWN STRL REUS W/TWL LRG LVL3 (GOWN DISPOSABLE) ×6
ILLUMINATOR WAVEGUIDE N/F (MISCELLANEOUS) ×3 IMPLANT
KIT BASIN OR (CUSTOM PROCEDURE TRAY) ×3 IMPLANT
KIT MARKER MARGIN INK (KITS) ×3 IMPLANT
KIT ROOM TURNOVER OR (KITS) ×3 IMPLANT
MARKER SKIN DUAL TIP RULER LAB (MISCELLANEOUS) ×3 IMPLANT
NDL SAFETY ECLIPSE 18X1.5 (NEEDLE) IMPLANT
NEEDLE FILTER BLUNT 18X 1/2SAF (NEEDLE) ×1
NEEDLE FILTER BLUNT 18X1 1/2 (NEEDLE) ×2 IMPLANT
NEEDLE HYPO 18GX1.5 SHARP (NEEDLE)
NEEDLE HYPO 25GX1X1/2 BEV (NEEDLE) ×3 IMPLANT
NS IRRIG 1000ML POUR BTL (IV SOLUTION) ×3 IMPLANT
PACK SURGICAL SETUP 50X90 (CUSTOM PROCEDURE TRAY) ×3 IMPLANT
PAD ARMBOARD 7.5X6 YLW CONV (MISCELLANEOUS) ×6 IMPLANT
PENCIL BUTTON HOLSTER BLD 10FT (ELECTRODE) ×3 IMPLANT
SPONGE LAP 18X18 X RAY DECT (DISPOSABLE) ×3 IMPLANT
STRIP CLOSURE SKIN 1/2X4 (GAUZE/BANDAGES/DRESSINGS) ×3 IMPLANT
SUT MNCRL AB 4-0 PS2 18 (SUTURE) ×9 IMPLANT
SUT SILK 2 0 SH (SUTURE) IMPLANT
SUT VIC AB 2-0 SH 27 (SUTURE) ×6
SUT VIC AB 2-0 SH 27XBRD (SUTURE) ×6 IMPLANT
SUT VIC AB 3-0 SH 27 (SUTURE) ×9
SUT VIC AB 3-0 SH 27X BRD (SUTURE) ×6 IMPLANT
SYR BULB 3OZ (MISCELLANEOUS) ×3 IMPLANT
SYR CONTROL 10ML LL (SYRINGE) ×3 IMPLANT
TOWEL OR 17X24 6PK STRL BLUE (TOWEL DISPOSABLE) ×3 IMPLANT
TOWEL OR 17X26 10 PK STRL BLUE (TOWEL DISPOSABLE) ×3 IMPLANT
TUBE CONNECTING 12X1/4 (SUCTIONS) ×3 IMPLANT
WATER STERILE IRR 1000ML POUR (IV SOLUTION) IMPLANT
YANKAUER SUCT BULB TIP NO VENT (SUCTIONS) ×3 IMPLANT

## 2016-11-03 NOTE — Discharge Instructions (Signed)
[facility] °Office Phone Number [phone] °POST OP INSTRUCTIONS ° °Always review your discharge instruction sheet given to you by the facility where your surgery was performed. ° °IF YOU HAVE DISABILITY OR FAMILY LEAVE FORMS, YOU MUST BRING THEM TO THE OFFICE FOR PROCESSING.  DO NOT GIVE THEM TO YOUR DOCTOR. ° °1. A prescription for pain medication may be given to you upon discharge.  Take your pain medication as prescribed, if needed.  If narcotic pain medicine is not needed, then you may take acetaminophen (Tylenol), naprosyn (Alleve) or ibuprofen (Advil) as needed. °2. Take your usually prescribed medications unless otherwise directed °3. If you need a refill on your pain medication, please contact your pharmacy.  They will contact our office to request authorization.  Prescriptions will not be filled after 5pm or on week-ends. °4. You should eat very light the first 24 hours after surgery, such as soup, crackers, pudding, etc.  Resume your normal diet the day after surgery. °5. Most patients will experience some swelling and bruising in the breast.  Ice packs and a good support bra will help.  Wear the breast binder provided or a sports bra for 72 hours day and night.  After that wear a sports bra during the day until you return to the office. Swelling and bruising can take several days to resolve.  °6. It is common to experience some constipation if taking pain medication after surgery.  Increasing fluid intake and taking a stool softener will usually help or prevent this problem from occurring.  A mild laxative (Milk of Magnesia or Miralax) should be taken according to package directions if there are no bowel movements after 48 hours. °7. Unless discharge instructions indicate otherwise, you may remove your bandages 48 hours after surgery and you may shower at that time.  You may have steri-strips (small skin tapes) in place directly over the incision.  These strips should be left on the  skin for 7-10 days and will come off on their own.  If your surgeon used skin glue on the incision, you may shower in 24 hours.  The glue will flake off over the next 2-3 weeks.  Any sutures or staples will be removed at the office during your follow-up visit. °8. ACTIVITIES:  You may resume regular daily activities (gradually increasing) beginning the next day.  Wearing a good support bra or sports bra minimizes pain and swelling.  You may have sexual intercourse when it is comfortable. °a. You may drive when you no longer are taking prescription pain medication, you can comfortably wear a seatbelt, and you can safely maneuver your car and apply brakes. °b. RETURN TO WORK:  ______________________________________________________________________________________ °9. You should see your doctor in the office for a follow-up appointment approximately two weeks after your surgery.  Your doctor’s nurse will typically make your follow-up appointment when she calls you with your pathology report.  Expect your pathology report 3-4 business days after your surgery.  You may call to check if you do not hear from us after three days. °10. OTHER INSTRUCTIONS: _______________________________________________________________________________________________ _____________________________________________________________________________________________________________________________________ °_____________________________________________________________________________________________________________________________________ °_____________________________________________________________________________________________________________________________________ ° °WHEN TO CALL DR Donyea Beverlin: °1. Fever over 101.0 °2. Nausea and/or vomiting. °3. Extreme swelling or bruising. °4. Continued bleeding from incision. °5. Increased pain, redness, or drainage from the incision. ° °The clinic staff is available to answer your questions during regular  business hours.  Please don’t hesitate to call and ask to speak to one of the nurses for clinical concerns.  If you   have a medical emergency, go to the nearest emergency room or call 911.  A surgeon from [facility] is always on call at the hospital. ° °For further questions, please visit [email or web address] ° °

## 2016-11-03 NOTE — Anesthesia Procedure Notes (Signed)
Procedure Name: LMA Insertion Date/Time: 11/03/2016 7:47 AM Performed by: Lance Coon Pre-anesthesia Checklist: Patient identified, Emergency Drugs available, Suction available, Patient being monitored and Timeout performed Patient Re-evaluated:Patient Re-evaluated prior to induction Oxygen Delivery Method: Circle system utilized Preoxygenation: Pre-oxygenation with 100% oxygen Induction Type: IV induction LMA: LMA inserted LMA Size: 4.0 Number of attempts: 1 Placement Confirmation: positive ETCO2 and breath sounds checked- equal and bilateral Tube secured with: Tape Dental Injury: Teeth and Oropharynx as per pre-operative assessment

## 2016-11-03 NOTE — Anesthesia Preprocedure Evaluation (Signed)
Anesthesia Evaluation  Patient identified by MRN, date of birth, ID band Patient awake    Reviewed: Allergy & Precautions, H&P , NPO status , Patient's Chart, lab work & pertinent test results  Airway Mallampati: II   Neck ROM: full    Dental   Pulmonary neg pulmonary ROS,    breath sounds clear to auscultation       Cardiovascular hypertension,  Rhythm:regular Rate:Normal     Neuro/Psych PSYCHIATRIC DISORDERS Anxiety    GI/Hepatic   Endo/Other    Renal/GU      Musculoskeletal  (+) Arthritis ,   Abdominal   Peds  Hematology   Anesthesia Other Findings   Reproductive/Obstetrics                             Anesthesia Physical Anesthesia Plan  ASA: II  Anesthesia Plan: General   Post-op Pain Management:  Regional for Post-op pain   Induction: Intravenous  PONV Risk Score and Plan: 3 and Ondansetron, Dexamethasone, Midazolam and Treatment may vary due to age or medical condition  Airway Management Planned: LMA  Additional Equipment:   Intra-op Plan:   Post-operative Plan:   Informed Consent: I have reviewed the patients History and Physical, chart, labs and discussed the procedure including the risks, benefits and alternatives for the proposed anesthesia with the patient or authorized representative who has indicated his/her understanding and acceptance.     Plan Discussed with: CRNA, Anesthesiologist and Surgeon  Anesthesia Plan Comments:         Anesthesia Quick Evaluation

## 2016-11-03 NOTE — Transfer of Care (Signed)
Immediate Anesthesia Transfer of Care Note  Patient: Tanya [name]  Procedure(s) Performed: Procedure(s): INJECT BLUE DYE LEFT BREAST, LEFT BREAST RADIOACTIVE SEED GUIDED LUMPECTOMY WITH LEFT SENTINEL LYMPH NODE BIOPSY AND RADIOACTIVE SEED TARGETED AXILLARY LYMPH NODE EXCISION (Left) REMOVAL PORT-A-CATH (Right)  Patient Location: PACU  Anesthesia Type:GA combined with regional for post-op pain  Level of Consciousness: awake, alert , oriented and patient cooperative  Airway & Oxygen Therapy: Patient Spontanous Breathing  Post-op Assessment: Report given to RN and Post -op Vital signs reviewed and stable  Post vital signs: Reviewed and stable  Last Vitals:  Vitals:   11/03/16 0601 11/03/16 0917  BP: (!) 172/98   Pulse: 98   Resp: 16   Temp: (!) 36.4 C (!) 36 C  SpO2: 100%     Last Pain:  Vitals:   11/03/16 0601  TempSrc: Oral  PainSc: 0-No pain      Patients Stated Pain Goal: 7 (09/32/35 5732)  Complications: No apparent anesthesia complications

## 2016-11-03 NOTE — Interval H&P Note (Signed)
History and Physical Interval Note:  11/03/2016 6:52 AM  Tanya [name]  has presented today for surgery, with the diagnosis of LEFT BREAST CANCER  The various methods of treatment have been discussed with the patient and family. After consideration of risks, benefits and other options for treatment, the patient has consented to  Procedure(s): INJECT BLUE DYE LEFT BREAST, LEFT BREAST RADIOACTIVE SEED GUIDED LUMPECTOMY WITH LEFT SENTINEL LYMPH NODE BIOPSY AND RADIOACTIVE SEED TARGETED AXILLARY LYMPH NODE EXCISION (Left) REMOVAL PORT-A-CATH (N/A) as a surgical intervention .  The patient's history has been reviewed, patient examined, no change in status, stable for surgery.  I have reviewed the patient's chart and labs.  Questions were answered to the patient's satisfaction.     [name]

## 2016-11-03 NOTE — Anesthesia Postprocedure Evaluation (Signed)
Anesthesia Post Note  Patient: Tanya [name]  Procedure(s) Performed: Procedure(s) (LRB): INJECT BLUE DYE LEFT BREAST, LEFT BREAST RADIOACTIVE SEED GUIDED LUMPECTOMY WITH LEFT SENTINEL LYMPH NODE BIOPSY AND RADIOACTIVE SEED TARGETED AXILLARY LYMPH NODE EXCISION (Left) REMOVAL PORT-A-CATH (Right)     Patient location during evaluation: PACU Anesthesia Type: General Level of consciousness: awake and alert and patient cooperative Pain management: pain level controlled Vital Signs Assessment: post-procedure vital signs reviewed and stable Respiratory status: spontaneous breathing and respiratory function stable Cardiovascular status: stable Anesthetic complications: no    Last Vitals:  Vitals:   11/03/16 0945 11/03/16 1000  BP: (!) 176/98 (!) 165/93  Pulse: 81 77  Resp: (!) 21 18  Temp: 36.5 C   SpO2: 98% 100%    Last Pain:  Vitals:   11/03/16 0945  TempSrc:   PainSc: 0-No pain                 Theo Krumholz S

## 2016-11-03 NOTE — H&P (Signed)
Tanya [name] is an 61 y.o. female.   Chief Complaint: breast cancer  HPI:  71 yof referred by Dr Kyung Rudd for evaluation of new left breast cancer. she works as a Multimedia programmer at [employer] and she is originally from [name]. she is here with her husband, daughter and a son today. she has fh in mom over age 61. she has no real issues previously with either breast. she underwent screening mm that shows c density breasts. there is in left breast an asymmetry with prominent node. US of the breast mass shows a 1.3x0.9x1.8 cm breast mass with an adjacent 1.1 cm mass (or enlarged im node) at 2 oclock in left breast. the entire area measures 3.5 cm. there is an enlarged axillary node measuring 1.8 cm. the clip in the breast appears to be 2.5 cm lateral and superior to the mass likely migration. biopsy of the axillary node is positive. 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 she has undergone chemotherapy with acx4 followed by 8 cycles of paclitaxel. she has done well with this. she stopped due to neuropathy. she has mri that shows nl right breast, no residual enhancement in left breast, there is decrease in size of left axillary nodes to what appears to be normal. she is here today to discuss options.   Past Medical History:  Diagnosis Date  . ANXIETY   . Arthritis   . Dyspnea on exertion   . HYPERLIPIDEMIA   . HYPERTENSION, BENIGN ESSENTIAL   . Malignant neoplasm of upper-outer quadrant of left breast in female, estrogen receptor positive ([facility]) 05/04/2016  . OBESITY   . Pre-diabetes    no medications    Past Surgical History:  Procedure Laterality Date  . CESAREAN SECTION    . PORTACATH PLACEMENT Right 06/02/2016   Procedure: INSERTION PORT-A-CATH WITH ULTRASOUND GUIDANCE;  Surgeon: Rolm Bookbinder, MD;  Location: [facility];  Service: General;  Laterality: Right;  . TUBAL LIGATION       Family History  Problem Relation Age of Onset  . Breast cancer Mother 54   Social History:  reports that she has never smoked. She has never used smokeless tobacco. She reports that she drinks alcohol. She reports that she does not use drugs.  Allergies:  Allergies  Allergen Reactions  . Ascorbic Acid & Derivatives Other (See Comments)    Mouth sores (Vitamin C) with higher doses  . Camoquin [Amodiaquine] Itching    Medications Prior to Admission  Medication Sig Dispense Refill  . losartan (COZAAR) 50 MG tablet Take 50 mg by mouth at bedtime.  90 tablet 2  . Multiple Vitamin (MULTIVITAMIN WITH MINERALS) TABS tablet Take 1 tablet by mouth 3 (three) times a week. One-A-Day Women's 50+    . neomycin-bacitracin-polymyxin (NEOSPORIN) ointment Apply 1 application topically 2 (two) times daily as needed (for boil wound care).    . pravastatin (PRAVACHOL) 40 MG tablet TAKE 1 TABLET (40 MG TOTAL) BY MOUTH AT BEDTIME. 90 tablet 2  . simethicone (MYLICON) 80 MG chewable tablet Chew 80 mg by mouth every 6 (six) hours as needed (for gas).     Marland Kitchen acetaminophen (TYLENOL) 500 MG tablet Take 1,000 mg by mouth every 6 (six) hours as needed (for pain.).    Marland Kitchen lidocaine-prilocaine (EMLA) cream Apply to affected area once (Patient taking differently: Apply 1 application topically daily as needed. Apply  to affected area once) 30 g 3  . omeprazole (PRILOSEC) 40 MG capsule Take 1 capsule (40 mg total) by mouth daily. (Patient not taking: Reported on 10/21/2016) 90 capsule 3  . prochlorperazine (COMPAZINE) 10 MG tablet Take 1 tablet (10 mg total) by mouth every 6 (six) hours as needed (Nausea or vomiting). (Patient not taking: Reported on 10/21/2016) 30 tablet 1    No results found for this or any previous visit (from the past 48 hour(s)). Mm Lt Radioactive Seed Loc Mammo Guide  Result Date: 11/01/2016 CLINICAL DATA:  Patient for preoperative localization prior to left breast lumpectomy and lymph node  removal. EXAM: MAMMOGRAPHIC GUIDED RADIOACTIVE SEED LOCALIZATION OF THE LEFT BREAST COMPARISON:  Previous exam(s). FINDINGS: Patient presents for radioactive seed localization prior to left breast lumpectomy. I met with the patient and we discussed the procedure of seed localization including benefits and alternatives. We discussed the high likelihood of a successful procedure. We discussed the risks of the procedure including infection, bleeding, tissue injury and further surgery. We discussed the low dose of radioactivity involved in the procedure. Informed, written consent was given. The usual time-out protocol was performed immediately prior to the procedure. Using mammographic guidance, sterile technique, 1% lidocaine and an I-125 radioactive seed, coil shaped marking clip within the lateral left breast was localized using a lateral approach. The follow-up mammogram images confirm the seed in the expected location and were marked for Dr. Donne Hazel. Follow-up survey of the patient confirms presence of the radioactive seed. Order number of I-125 seed:  [id number]. Total activity:  1.324 millicuries  Reference Date: 10/20/2016 The patient tolerated the procedure well and was released from the [facility]. She was given instructions regarding seed removal. IMPRESSION: Radioactive seed localization left breast. No apparent complications. Electronically Signed   By: Lovey Newcomer M.D.   On: 11/01/2016 16:14   [place] Lt Radioactive Seed Loc  Result Date: 11/01/2016 CLINICAL DATA:  Patient for preoperative localization prior to left lymph node excision. EXAM: ULTRASOUND GUIDED RADIOACTIVE SEED LOCALIZATION OF THE LEFT BREAST COMPARISON:  Previous exam(s). FINDINGS: Patient presents for radioactive seed localization prior to left lymph node excision. I met with the patient and we discussed the procedure of seed localization including benefits and alternatives. We discussed the high likelihood of a successful procedure.  We discussed the risks of the procedure including infection, bleeding, tissue injury and further surgery. We discussed the low dose of radioactivity involved in the procedure. Informed, written consent was given. The usual time-out protocol was performed immediately prior to the procedure. Using ultrasound guidance, sterile technique, 1% lidocaine and an I-125 radioactive seed, HydroMARK clip and lymph node within the left axilla was localized using a lateral approach. The follow-up mammogram images confirm the seed in the expected location and were marked for Dr. Donne Hazel. Follow-up survey of the patient confirms presence of the radioactive seed. Order number of I-125 seed:  [id number]. Total activity:  6.644 millicurie         Reference Date: 10/20/2016 The patient tolerated the procedure well and was released from the [facility]. She was given instructions regarding seed removal. IMPRESSION: Radioactive seed localization left axilla. No apparent complications. Electronically Signed   By: Lovey Newcomer M.D.   On: 11/01/2016 16:15   [place] Lt Radioactive Seed Ea Add Lesion  Result Date: 11/01/2016 CLINICAL DATA:  Patient for preoperative localization prior to left breast lumpectomy of mass identified on MRI and biopsied using MRI guidance within the posterolateral left breast. EXAM:  ULTRASOUND GUIDED RADIOACTIVE SEED LOCALIZATION OF THE LEFT BREAST COMPARISON:  Previous exam(s). FINDINGS: Patient presents for radioactive seed localization prior to left breast lumpectomy. I met with the patient and we discussed the procedure of seed localization including benefits and alternatives. We discussed the high likelihood of a successful procedure. We discussed the risks of the procedure including infection, bleeding, tissue injury and further surgery. We discussed the low dose of radioactivity involved in the procedure. Informed, written consent was given. The usual time-out protocol was performed immediately prior to the  procedure. Using ultrasound guidance, sterile technique, 1% lidocaine and an I-125 radioactive seed, HydroMARK clip within the posterior left breast was localized using a lateral approach. The follow-up mammogram images confirm the seed in the expected location and were marked for Dr. Donne Hazel. Follow-up survey of the patient confirms presence of the radioactive seed. Order number of I-125 seed:  [id number]. Total activity: 6.629 millicuries Reference Date: 10/20/2016 The patient tolerated the procedure well and was released from the [facility]. She was given instructions regarding seed removal. IMPRESSION: Radioactive seed localization left breast. No apparent complications. Electronically Signed   By: Lovey Newcomer M.D.   On: 11/01/2016 16:16   Mm Clip Placement Left  Result Date: 11/01/2016 CLINICAL DATA:  Patient with left breast malignancy and metastatic left axillary adenopathy, for preoperative localization. EXAM: DIAGNOSTIC LEFT MAMMOGRAM POST ULTRASOUND-GUIDED AND MAMMOGRAM GUIDED RADIOACTIVE SEED PLACEMENT COMPARISON:  Previous exam(s). FINDINGS: Mammographic images were obtained following ultrasound-guided radioactive seed placement. These demonstrate the following: Site 1: Radioactive seed to be located within the left axillary lymph node adjacent to the [facility] clip. Site 2: Radioactive seed to be located adjacent to the coil shaped marking clip within the lateral left breast at the site of primary breast malignancy. Site 3: Radioactive seed to be located adjacent to the [facility] clip within the posterolateral left breast at the site of MRI guided core needle biopsy demonstrating malignancy. Note there is a ribbon shaped marking clip and a wing shaped marking clip which do not need to be removed as they do not mark any sites of biopsied malignancy. IMPRESSION: Appropriate location of the radioactive seeds. Final Assessment: Post Procedure Mammograms for Seed Placement Electronically Signed   By:  Lovey Newcomer M.D.   On: 11/01/2016 16:21    ROS  Blood pressure (!) 172/98, pulse 98, temperature (!) 97.5 F (36.4 C), temperature source Oral, resp. rate 16, SpO2 100 %. Physical Exam  Weight: 183 lb Height: 63in Body Surface Area: 1.86 m Body Mass Index: 32.42 kg/m  Temp.: 97.38F  BP: 150/90 (Sitting, Left Arm, Standard) Physical Exam Rolm Bookbinder MD; 10/18/2016 8:29 PM) General Mental Status-Alert. Head and Neck Trachea-midline. Thyroid Gland Characteristics - normal size and consistency. Eye Sclera/Conjunctiva - Bilateral-No scleral icterus. Pupil - Bilateral-Normal. Chest and Lung Exam Chest and lung exam reveals -quiet, even and easy respiratory effort with no use of accessory muscles and on auscultation, normal breath sounds, no adventitious sounds and normal vocal resonance. Breast Nipples-No Discharge. Breast Lump-No Palpable Breast Mass. Cardiovascular Cardiovascular examination reveals -normal heart sounds, regular rate and rhythm with no murmurs and no digital clubbing, cyanosis, edema, increased warmth or tenderness. Abdomen Note: soft nt/nd Neurologic Neurologic evaluation reveals -alert and oriented x 3 with no impairment of recent or remote memory. Mental Status-Normal. Lymphatic Head & Neck General Head & Neck Lymphatics: Bilateral - Description - Normal. Axillary General Axillary Region: Bilateral - Description - Normal. Note: no Casey adenopathy   Assessment/Plan BREAST CANCER  OF UPPER-OUTER QUADRANT OF LEFT FEMALE BREAST (C50.412) Story: Left breast seed guided lumpectomy, left targeted axillary dissection, possible port removal, possible alnd We reviewed mri and discussed response. We discussed lumpectomy vs mastectomy. we discussed they are similary in regards to cancer. due to response I think reasonable to pursue lumpectomy. she will likely get radiation with either surgery due to nodal disease. we discussed seed  placement. we discussed 5-10% risk of positive margins requiring another surgery as well. we discussed nodes. previously alnd would be needed. her nodes are clinically negative. she is motivated to do less nodal surgery if possible. I discussed a TAD. Will plan on seed guided excsiion of prior positive node and sn biopsy with dual tracer. discussed I will check in or and possibly proceed to full alnd if indicated. she understands possible drain, risk of lymphedema. I am going to ask oncology if they think port can be removed as she would very much like it removed. will proceed with surgery week of august 20.  Rolm Bookbinder, MD 11/03/2016, 6:51 AM

## 2016-11-03 NOTE — Anesthesia Procedure Notes (Signed)
Anesthesia Regional Block: Pectoralis block   Pre-Anesthetic Checklist: ,, timeout performed, Correct Patient, Correct Site, Correct Laterality, Correct Procedure, Correct Position, site marked, Risks and benefits discussed,  Surgical consent,  Pre-op evaluation,  At surgeon's request and post-op pain management  Laterality: Left  Prep: chloraprep       Needles:  Injection technique: Single-shot  Needle Type: Echogenic Needle     Needle Length: 9cm  Needle Gauge: 21     Additional Needles:   Procedures: ultrasound guided,,,,,,,,  Narrative:  Start time: 11/03/2016 7:13 AM End time: 11/03/2016 7:20 AM Injection made incrementally with aspirations every 5 mL.  Performed by: Personally  Anesthesiologist: Cyera Balboni  Additional Notes: Pt tolerated the procedure well.

## 2016-11-03 NOTE — Op Note (Signed)
Preoperative diagnosis: Left breast cancer status post primary chemotherapy, node positive Postoperative diagnosis: Same as above Procedure: #1 left breast radioactive seed bracketed lumpectomy #2 injection of blue dye for sentinel lymph node identification #3 left axillary targeted node dissection -this is a left deep axillary sentinel lymph node biopsy and a excision of a radioactive seed containing node that was positive at diagnosis #4 Port-A-Cath removal Surgeon: Dr. Serita Grammes Anesthesia: Gen. With left pectoral block Estimated blood loss: 50 mL Complications: None Drains: None Specimens: #1 left breast tissue marked with paint containing 2 radioactive seeds #2 left axillary node containing radioactive seed that is also a sentinel node #3 left axillary sentinel lymph node with a count of 921 Disposition to recovery stable condition  Indications: This is a 61 year old female who was diagnosed with a left breast cancer and has undergone primary chemotherapy. She is tolerated this well. She had 2 separate sites of cancer in her breast. She has a very good response by MRI. We discussed a seed bracketed lumpectomy as well as a targeted axillary lymph node dissection. Oncology has stated it is okay to remove her port at the same time.  Procedure: After informed consent was obtained the patient was first taken the operating room. She had 3 radioactive seeds placed by radiology and I discussed the positioning with them prior to beginning. She underwent a left pectoral block. She had SCDs in place. She was given antibiotics. She was administered technetium in the standard periareolar fashion. She then underwent general anesthesia without complication. Her chest was then prepped and draped in the standard sterile surgical fashion. Surgical timeout was performed.  I first removed the port. I infiltrated Marcaine at the prior port incision. I reentered this or remove the port and the line in  their entirety. All the suture material was removed. This was hemostatic. This is closed with 3-0 Vicryl, 4-0 Monocryl, and glue.  The 2 radioactive seeds that were present in the breasts were both in the left upper outer quadrant. I made a  curvilinear incision in the upper outer quadrant. I then used the neoprobe to identify both of the seeds and I attempted to remove both seeds and tissue to get a clear margin. This was then passed off the table and marked with paint. The posterior margin is the pectoralis now. Mammogram confirmed removal of the clips that were needed to be removed as well as the radioactive seeds. I placed clips in the cavity. I then closed this cavity with 2-0 Vicryl. The skin was closed with 3-0 Vicryl for Monocryl. Glue was applied.  I then located the seed containing node as well as sentinel nodes in the left axilla. I made an incision below the hairline in the axilla. I dissected through the axillary fascia. I first removed the radioactive seed containing node which was also a sentinel node. Mammogram confirmed removal of the hydro-mark clip as well as the radioactive seed. There was one additional sentinel lymph node that I removed that was radioactive. There were no other palpable or radioactive nodes present in the axilla. I elected at this point not to proceed with a frozen section or an axillary lymph node dissection as if these nodes are positive for think it would be reasonable just to give her radiation. I obtained hemostasis. I then closes with 2-0 Vicryl, 3-0 Vicryl, and 4-0 Monocryl. Glue was placed. A breast binder was placed. She tolerated this well was extubated transferred to recovery stable condition.

## 2016-11-04 ENCOUNTER — Encounter (HOSPITAL_COMMUNITY): Payer: Self-pay | Admitting: General Surgery

## 2016-11-24 ENCOUNTER — Ambulatory Visit (HOSPITAL_BASED_OUTPATIENT_CLINIC_OR_DEPARTMENT_OTHER): Payer: BLUE CROSS/BLUE SHIELD | Admitting: Oncology

## 2016-11-24 VITALS — BP 157/101 | HR 88 | Temp 98.6°F | Resp 18 | Ht 63.0 in | Wt 184.0 lb

## 2016-11-24 DIAGNOSIS — C773 Secondary and unspecified malignant neoplasm of axilla and upper limb lymph nodes: Secondary | ICD-10-CM

## 2016-11-24 DIAGNOSIS — Z170 Estrogen receptor positive status [ER+]: Secondary | ICD-10-CM | POA: Diagnosis not present

## 2016-11-24 DIAGNOSIS — C50412 Malignant neoplasm of upper-outer quadrant of left female breast: Secondary | ICD-10-CM | POA: Diagnosis not present

## 2016-11-24 NOTE — Progress Notes (Signed)
Tanya [name]  Telephone:([phone] Fax:(336) [phone]     ID: Tanya [name] DOB: 03/08/1956  MR#: [id number]  KVQ#:[id number]  Patient Care Team: Everrett Coombe, MD as PCP - General Rolm Bookbinder, MD as Consulting Physician (General Surgery) Zilla Shartzer, Virgie Dad, MD as Consulting Physician (Oncology) Kyung Rudd, MD as Consulting Physician (Radiation Oncology) Chauncey Cruel, MD OTHER MD:  CHIEF COMPLAINT: Estrogen receptor positive breast cancer  CURRENT TREATMENT: awaiting adjuvant radiation   BREAST CANCER HISTORY: From the original intake note:  Tanya [name] had routine bilateral screening mammography at the [facility] 04/19/2016. This showed a possible area of the symmetry in the left breast and a prominent left axillary lymph nodes. On 04/28/2016 she underwent diagnostic left mammography with tomography and left breast ultrasonography. This showed the breast density to be category B. There was an obscured mass in the upper outer left breast and an enlarged left axillary lymph node. On exam there was focal thickening at the 2:00 position of the left breast 9 cm from the nipple. Targeted ultrasonography confirmed a 1.8 cm hypoechoic mass with a second adjacent 1.1 cm mass both at the 2:00 position of the left breast 9 cm from the nipple. The conglomerate area measured 3.5 cm. There was also an enlarged left axillary lymph node with loss of fatty hilum measuring 1.8 cm.  On family 16 2018 the patient underwent biopsy of the larger left breast mass in question and the suspicious left axillary lymph node. 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  The patient's subsequent history is as detailed below  INTERVAL HISTORY: Tanya [name] returns today  for follow-up and treatment of her estrogen receptor positive breast cancer. Since her last visit here she underwent left lumpectomy and sentinel lymph node sampling. This was performed 11/03/2016. It showed ([id number]) no residual carcinoma in the left breast. One of 2 sentinel lymph nodes had a 0.5 cm residual area of carcinoma, associated with fibrosis. Margins were negative.  Surgery went well and only endorses mild discomfort and swelling that has been improving. She only had to take tylenol occasionally for the pain. Pt did not see any erythema to the surgical site. Her port was also removed and the area has been healing well. Pt endorses decreased sense of taste and loss of appetite. Her weight is stable, because she makes herself eat. She drinks 2.5-3 quarts of water a day. Pt recently went back to work and states she feels better when she is working. However, she has experienced some episodes of dizziness after standing up. After sitting back down for a moment the dizziness will subside. Over the last week she has noticed her blood pressure is increased. She is concerned about dry skin to her face and has used olive oil with temporary relief. Pt is also wondering if using lemons would help with her skin darkening to her face. 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 She has not been wearing a bra due to the discomfort.   REVIEW OF SYSTEMS: She denies unusual headaches, visual changes, nausea, vomiting. There has been no unusual cough, phlegm production, or pleurisy. This been no change in bowel or bladder habits. She denies unexplained fatigue or unexplained weight loss, bleeding, rash, or fever. A detailed review of systems was otherwise  negative except as stated above    PAST MEDICAL HISTORY: Past Medical History:  Diagnosis Date  . ANXIETY   . Arthritis   . Dyspnea on exertion   . HYPERLIPIDEMIA   . HYPERTENSION, BENIGN ESSENTIAL     . Malignant neoplasm of upper-outer quadrant of left breast in female, estrogen receptor positive ([place]) 05/04/2016  . OBESITY   . Pre-diabetes    no medications    PAST SURGICAL HISTORY: Past Surgical History:  Procedure Laterality Date  . BREAST LUMPECTOMY WITH RADIOACTIVE SEED AND SENTINEL LYMPH NODE BIOPSY Left 11/03/2016   Procedure: INJECT BLUE DYE LEFT BREAST, LEFT BREAST RADIOACTIVE SEED GUIDED LUMPECTOMY WITH LEFT SENTINEL LYMPH NODE BIOPSY AND RADIOACTIVE SEED TARGETED AXILLARY LYMPH NODE EXCISION;  Surgeon: Rolm Bookbinder, MD;  Location: [place];  Service: General;  Laterality: Left;  . CESAREAN SECTION    . PORT-A-CATH REMOVAL Right 11/03/2016   Procedure: REMOVAL PORT-A-CATH;  Surgeon: Rolm Bookbinder, MD;  Location: [place];  Service: General;  Laterality: Right;  . PORTACATH PLACEMENT Right 06/02/2016   Procedure: INSERTION PORT-A-CATH WITH ULTRASOUND GUIDANCE;  Surgeon: Rolm Bookbinder, MD;  Location: [place];  Service: General;  Laterality: Right;  . TUBAL LIGATION      FAMILY HISTORY Family History  Problem Relation Age of Onset  . Breast cancer Mother 67  The patient's father had a history of prostate cancer. He died at age 28. The patient's mother died at age 64 from unclear causes. She had a history of breast cancer diagnosed in her 30s. The patient had 4 brothers, 2 sisters. There is no other history of breast or ovarian cancer in the family to the patient's knowledge  GYNECOLOGIC HISTORY:  No LMP recorded. Patient is postmenopausal. Menarche age 75, first live birth age 62, the patient is [place] P4. She stopped having periods approximately 2006. She did not use hormone replacement. She used oral contraceptives briefly and remotely, with no complications.  SOCIAL HISTORY:  The patient is originally from [name]. 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 The patient's husband Tanya [name] was also a professor at [employer], where he taught  agriculture oh research. Son Tanya [name] works in Land, son Tanya [name] works for [employer], daughter Tanya [name] is a Education officer, museum, and daughter Tanya [name] is also a Education officer, museum, all living in [place]. The patient has 3 grandchildren. She attends [employer]    ADVANCED DIRECTIVES: Not in place   HEALTH MAINTENANCE: Social History  Substance Use Topics  . Smoking status: Never Smoker  . Smokeless tobacco: Never Used  . Alcohol use Yes     Comment: rarely     Colonoscopy:Never  PAP: November 2017  Bone density: Never   Allergies  Allergen Reactions  . Ascorbic Acid & Derivatives Other (See Comments)    Mouth sores (Vitamin C) with higher doses  . Camoquin [Amodiaquine] Itching    Current Outpatient Prescriptions  Medication Sig Dispense Refill  . acetaminophen (TYLENOL) 500 MG tablet Take 1,000 mg by mouth every 6 (six) hours as needed (for pain.).    Marland Kitchen losartan (COZAAR) 50 MG tablet Take 50 mg by mouth at bedtime.  90 tablet 2  . Multiple Vitamin (MULTIVITAMIN WITH MINERALS) TABS tablet Take 1 tablet by mouth 3 (three) times a week. One-A-Day Women's 50+    . pravastatin (PRAVACHOL) 40 MG tablet TAKE 1 TABLET (40 MG TOTAL) BY MOUTH AT BEDTIME. 90 tablet 2  . lidocaine-prilocaine (EMLA) cream Apply to affected area once (Patient  not taking: Reported on 11/24/2016) 30 g 3  . methocarbamol (ROBAXIN) 750 MG tablet Take 1 tablet (750 mg total) by mouth 4 (four) times daily as needed (use for muscle cramps/pain). (Patient not taking: Reported on 11/24/2016) 15 tablet 0  . neomycin-bacitracin-polymyxin (NEOSPORIN) ointment Apply 1 application topically 2 (two) times daily as needed (for boil wound care).    Marland Kitchen omeprazole (PRILOSEC) 40 MG capsule Take 1 capsule (40 mg total) by mouth daily. (Patient not taking: Reported on 10/21/2016) 90 capsule 3  . oxyCODONE (OXY IR/ROXICODONE) 5 MG immediate release tablet Take 1 tablet (5 mg total) by mouth every 6 (six) hours as needed for  moderate pain, severe pain or breakthrough pain. (Patient not taking: Reported on 11/24/2016) 15 tablet 0  . prochlorperazine (COMPAZINE) 10 MG tablet Take 1 tablet (10 mg total) by mouth every 6 (six) hours as needed (Nausea or vomiting). (Patient not taking: Reported on 10/21/2016) 30 tablet 1  . simethicone (MYLICON) 80 MG chewable tablet Chew 80 mg by mouth every 6 (six) hours as needed (for gas).      No current facility-administered medications for this visit.     OBJECTIVE: Middle-aged African-American woman no acute distress  Vitals:   11/24/16 1458  BP: (!) 157/101  Pulse: 88  Resp: 18  Temp: 98.6 F (37 C)  SpO2: 100%     Body mass index is 32.59 kg/m.    ECOG FS:1 - Symptomatic but completely ambulatory  Sclerae unicteric, pupils round and equal Oropharynx clear and moist No cervical or supraclavicular adenopathy Lungs no rales or rhonchi Heart regular rate and rhythm Abd soft, nontender, positive bowel sounds MSK no focal spinal tenderness, no upper extremity lymphedema Neuro: nonfocal, well oriented, appropriate affect Breasts: The right breast is unremarkable. The left breast is status post recent lumpectomy and axillary sentinel lymph node sampling the incisions are healing very nicely. There is no erythema, significant swelling, or dehiscence. The cosmetic result is good. Both axillae are benign.  LAB RESULTS:  CMP     Component Value Date/Time   NA 140 10/25/2016 1050   NA 141 10/06/2016 1018   K 3.6 10/25/2016 1050   K 3.6 10/06/2016 1018   CL 107 10/25/2016 1050   CO2 25 10/25/2016 1050   CO2 26 10/06/2016 1018   GLUCOSE 140 (H) 10/25/2016 1050   GLUCOSE 111 10/06/2016 1018   BUN 7 10/25/2016 1050   BUN 7.5 10/06/2016 1018   CREATININE 0.69 10/25/2016 1050   CREATININE 0.7 10/06/2016 1018   CALCIUM 9.0 10/25/2016 1050   CALCIUM 9.3 10/06/2016 1018   PROT 6.5 10/06/2016 1018   ALBUMIN 3.7 10/06/2016 1018   AST 21 10/06/2016 1018   ALT 17 10/06/2016  1018   ALKPHOS 64 10/06/2016 1018   BILITOT 0.67 10/06/2016 1018   GFRNONAA >60 10/25/2016 1050   GFRAA >60 10/25/2016 1050    INo results found for: SPEP, UPEP  CBC    Component Value Date/Time   WBC 6.1 10/25/2016 1050   RBC 4.30 10/25/2016 1050   HGB 11.3 (L) 10/25/2016 1050   HGB 10.2 (L) 10/06/2016 1018   HCT 34.9 (L) 10/25/2016 1050   HCT 31.2 (L) 10/06/2016 1018   PLT 208 10/25/2016 1050   PLT 243 10/06/2016 1018   MCV 81.2 10/25/2016 1050   MCV 85.5 10/06/2016 1018   MCH 26.3 10/25/2016 1050   MCHC 32.4 10/25/2016 1050   RDW 16.3 (H) 10/25/2016 1050   RDW 17.1 (H) 10/06/2016  1018   LYMPHSABS 1.3 10/06/2016 1018   MONOABS 0.4 10/06/2016 1018   EOSABS 0.1 10/06/2016 1018   BASOSABS 0.1 10/06/2016 1018        Chemistry      Component Value Date/Time   NA 140 10/25/2016 1050   NA 141 10/06/2016 1018   K 3.6 10/25/2016 1050   K 3.6 10/06/2016 1018   CL 107 10/25/2016 1050   CO2 25 10/25/2016 1050   CO2 26 10/06/2016 1018   BUN 7 10/25/2016 1050   BUN 7.5 10/06/2016 1018   CREATININE 0.69 10/25/2016 1050   CREATININE 0.7 10/06/2016 1018      Component Value Date/Time   CALCIUM 9.0 10/25/2016 1050   CALCIUM 9.3 10/06/2016 1018   ALKPHOS 64 10/06/2016 1018   AST 21 10/06/2016 1018   ALT 17 10/06/2016 1018   BILITOT 0.67 10/06/2016 1018       No results found for: LABCA2  No components found for: LABCA125  No results for input(s): INR in the last 168 hours.  Urinalysis    Component Value Date/Time   COLORURINE yellow 06/01/2006 1116   APPEARANCEUR Clear 06/01/2006 1116   LABSPEC 1.010 08/18/2016 1217   PHURINE 6.5 08/18/2016 1217   PHURINE 6.5 06/01/2006 1116   GLUCOSEU Negative 08/18/2016 1217   HGBUR Negative 08/18/2016 1217   HGBUR negative 06/01/2006 1116   BILIRUBINUR Negative 08/18/2016 1217   KETONESUR Negative 08/18/2016 1217   PROTEINUR Negative 08/18/2016 1217   UROBILINOGEN 0.2 08/18/2016 1217   NITRITE Negative 08/18/2016  1217   NITRITE negative 06/01/2006 1116   LEUKOCYTESUR Negative 08/18/2016 1217     STUDIES: Mm Breast Surgical Specimen  Result Date: 11/03/2016 CLINICAL DATA:  Status post radioactive seed localization and lumpectomy left breast. EXAM: SPECIMEN RADIOGRAPH OF THE LEFT BREAST COMPARISON:  Previous exam(s). FINDINGS: Status post excision of the left breast. There are 2 radioactive seeds and 4 tissue marker clips (ribbon, coil, bleeding, and spiral shaped) within the specimen submitted. These are marked for pathology. IMPRESSION: Specimen radiograph of the left breast. Electronically Signed   By: Nolon Nations M.D.   On: 11/03/2016 12:03   Mm Breast Surgical Specimen  Result Date: 11/03/2016 CLINICAL DATA:  Status post excision of left axillary lymph node following radioactive seed localization. EXAM: SPECIMEN RADIOGRAPH OF THE LEFT AXILLA COMPARISON:  Previous exam(s). FINDINGS: Status post excision of the left axilla. The radioactive seed and biopsy marker clip are present, completely intact, and were marked for pathology. IMPRESSION: Specimen radiograph of the left axilla. Electronically Signed   By: Nolon Nations M.D.   On: 11/03/2016 12:01   Mm Lt Radioactive Seed Loc Mammo Guide  Result Date: 11/01/2016 CLINICAL DATA:  Patient for preoperative localization prior to left breast lumpectomy and lymph node removal. EXAM: MAMMOGRAPHIC GUIDED RADIOACTIVE SEED LOCALIZATION OF THE LEFT BREAST COMPARISON:  Previous exam(s). FINDINGS: Patient presents for radioactive seed localization prior to left breast lumpectomy. I met with the patient and we discussed the procedure of seed localization including benefits and alternatives. We discussed the high likelihood of a successful procedure. We discussed the risks of the procedure including infection, bleeding, tissue injury and further surgery. We discussed the low dose of radioactivity involved in the procedure. Informed, written consent was given. The  usual time-out protocol was performed immediately prior to the procedure. Using mammographic guidance, sterile technique, 1% lidocaine and an I-125 radioactive seed, coil shaped marking clip within the lateral left breast was localized using a lateral approach.  The follow-up mammogram images confirm the seed in the expected location and were marked for Dr. Donne Hazel. Follow-up survey of the patient confirms presence of the radioactive seed. Order number of I-125 seed:  [id number]. Total activity:  6.283 millicuries  Reference Date: 10/20/2016 The patient tolerated the procedure well and was released from the [facility]. She was given instructions regarding seed removal. IMPRESSION: Radioactive seed localization left breast. No apparent complications. Electronically Signed   By: Lovey Newcomer M.D.   On: 11/01/2016 16:14   [place] Lt Radioactive Seed Loc  Result Date: 11/01/2016 CLINICAL DATA:  Patient for preoperative localization prior to left lymph node excision. EXAM: ULTRASOUND GUIDED RADIOACTIVE SEED LOCALIZATION OF THE LEFT BREAST COMPARISON:  Previous exam(s). FINDINGS: Patient presents for radioactive seed localization prior to left lymph node excision. I met with the patient and we discussed the procedure of seed localization including benefits and alternatives. We discussed the high likelihood of a successful procedure. We discussed the risks of the procedure including infection, bleeding, tissue injury and further surgery. We discussed the low dose of radioactivity involved in the procedure. Informed, written consent was given. The usual time-out protocol was performed immediately prior to the procedure. Using ultrasound guidance, sterile technique, 1% lidocaine and an I-125 radioactive seed, HydroMARK clip and lymph node within the left axilla was localized using a lateral approach. The follow-up mammogram images confirm the seed in the expected location and were marked for Dr. Donne Hazel. Follow-up survey  of the patient confirms presence of the radioactive seed. Order number of I-125 seed:  [id number]. Total activity:  6.503 millicurie         Reference Date: 10/20/2016 The patient tolerated the procedure well and was released from the [place]. She was given instructions regarding seed removal. IMPRESSION: Radioactive seed localization left axilla. No apparent complications. Electronically Signed   By: Lovey Newcomer M.D.   On: 11/01/2016 16:15   [place] Lt Radioactive Seed Ea Add Lesion  Result Date: 11/01/2016 CLINICAL DATA:  Patient for preoperative localization prior to left breast lumpectomy of mass identified on MRI and biopsied using MRI guidance within the posterolateral left breast. EXAM: ULTRASOUND GUIDED RADIOACTIVE SEED LOCALIZATION OF THE LEFT BREAST COMPARISON:  Previous exam(s). FINDINGS: Patient presents for radioactive seed localization prior to left breast lumpectomy. I met with the patient and we discussed the procedure of seed localization including benefits and alternatives. We discussed the high likelihood of a successful procedure. We discussed the risks of the procedure including infection, bleeding, tissue injury and further surgery. We discussed the low dose of radioactivity involved in the procedure. Informed, written consent was given. The usual time-out protocol was performed immediately prior to the procedure. Using ultrasound guidance, sterile technique, 1% lidocaine and an I-125 radioactive seed, HydroMARK clip within the posterior left breast was localized using a lateral approach. The follow-up mammogram images confirm the seed in the expected location and were marked for Dr. Donne Hazel. Follow-up survey of the patient confirms presence of the radioactive seed. Order number of I-125 seed:  [id number]. Total activity: 5.170 millicuries Reference Date: 10/20/2016 The patient tolerated the procedure well and was released from the [facility]. She was given instructions regarding seed  removal. IMPRESSION: Radioactive seed localization left breast. No apparent complications. Electronically Signed   By: Lovey Newcomer M.D.   On: 11/01/2016 16:16   Mm Clip Placement Left  Result Date: 11/01/2016 CLINICAL DATA:  Patient with left breast malignancy and metastatic left axillary adenopathy, for preoperative localization. EXAM:  DIAGNOSTIC LEFT MAMMOGRAM POST ULTRASOUND-GUIDED AND MAMMOGRAM GUIDED RADIOACTIVE SEED PLACEMENT COMPARISON:  Previous exam(s). FINDINGS: Mammographic images were obtained following ultrasound-guided radioactive seed placement. These demonstrate the following: Site 1: Radioactive seed to be located within the left axillary lymph node adjacent to the [facility] clip. Site 2: Radioactive seed to be located adjacent to the coil shaped marking clip within the lateral left breast at the site of primary breast malignancy. Site 3: Radioactive seed to be located adjacent to the [facility] clip within the posterolateral left breast at the site of MRI guided core needle biopsy demonstrating malignancy. Note there is a ribbon shaped marking clip and a wing shaped marking clip which do not need to be removed as they do not mark any sites of biopsied malignancy. IMPRESSION: Appropriate location of the radioactive seeds. Final Assessment: Post Procedure Mammograms for Seed Placement Electronically Signed   By: Lovey Newcomer M.D.   On: 11/01/2016 16:21    ELIGIBLE FOR AVAILABLE RESEARCH PROTOCOL: Not a candidate for Prevent (already on a statin drug)  ASSESSMENT: 61 y.o. [place] woman status post left breast upper inner quadrant and left axillary lymph node biopsy every 16 2018, both showing invasive ductal carcinoma, grade 3, clinically T1c N1a, or stage IIB, estrogen receptor positive, progesterone receptor and HER-2 negative, with an MIB-1 of 40%  (a) MRI biopsy 05/25/2016 of a second area of concern in the left breast also shows invasive ductal carcinoma  (1) neoadjuvant anastrozole  started 05/11/2016, held at the start of chemotherapy  (2) Mammaprint results shows the tumor to be luminal B, high risk, indicating a need for chemotherapy.  (3) chemotherapy consisting of doxorubicin and cyclophosphamide in dose dense fashion 4 starting 06/09/2016, completed 07/21/2016, followed by weekly paclitaxel started 08/11/2016  (a) paclitaxel stopped after cycle 8 because of neuropathy; last dose 09/29/2016  (4) left lumpectomy and sentinel lymph node sampling 11/03/2016 showed no residual tumor in the breast. One of 2 sentinel lymph nodes was involved by tumor. Margins were clear. pT0 pN1  (5) adjuvant radiation pending  (6) to resume anastrozole at the completion of adjuvant radiation  PLAN: Taylia had many questions today and this visit took well over 25 minutes. Nevertheless in general she is doing very well. She had no complications from the surgery, and had a good cosmetic result.  We discussed the final pathology in detail. She understands there is some possibility that other lymph nodes in the left axilla may be involved, but this will be taken care of by the upcoming radiation.  I don't have a simple explanation for her occasional dizziness. 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 It is easily relieved by sitting briefly. She does not she is hydrating herself well and that her blood pressure has not been low, in fact has been a bit higher recently. I suggested she do sitting to standing exercises at home repeatedly until her body becomes used to the changing position. Certainly chemotherapy can affect the autonomic nervous system and this may be what she is experiencing  She is having some breast discomfort. This is expected. I reassured her that breast sensitivity, breast soreness, and what she calls biting pains are all normal in this setting  She will meet with radiation oncology next week to plan her treatments. She  is going to see me mid November. She will resume anastrozole at that time  She knows to call for any problems that  may develop before that visit.   Syaire Saber, Virgie Dad, MD  11/24/16 3:26 PM Medical Oncology and Hematology [facility] [address] [place], [place] [address] Tel. [phone]    Fax. [phone]  This document serves as a record of services personally performed by Chauncey Cruel, MD. It was created on her behalf by Margit Banda, a trained medical scribe. The creation of this record is based on the scribe's personal observations and the provider's statements to them. This document has been checked and approved by the attending provider.

## 2016-11-25 ENCOUNTER — Other Ambulatory Visit: Payer: BLUE CROSS/BLUE SHIELD

## 2016-11-25 ENCOUNTER — Ambulatory Visit: Payer: BLUE CROSS/BLUE SHIELD | Admitting: Oncology

## 2016-11-28 NOTE — Progress Notes (Signed)
Location of Breast Cancer:Upper-outer quadrant of the left breast   Histology per Pathology Report:  Diagnosis 11-03-16 1. Breast, lumpectomy, Left - FIBROSIS AND INFLAMMATION CONSISTENT WITH TREATMENT EFFECT. - PREVIOUS BIOPSY SITE AND CLIPS. - FIBROCYSTIC CHANGES WITH CALCIFICATIONS. - NO RESIDUAL CARCINOMA IDENTIFIED. 2. Lymph node, sentinel, biopsy, Left - METASTATIC CARCINOMA IN ONE LYMPH NODE, 0.5 CM, ASSOCIATED WITH FIBROSIS. 3. Lymph node, sentinel, biopsy, Left axillary #2 - ONE BENIGN LYMPH NODE (0/1).   Receptor Status: ER(95 %+), PR (0%-), Her2-neu (neg), Ki-(40 %)  Diagnosis 05-25-16 Breast, left, needle core biopsy, lateral far posterior - INVASIVE MAMMARY CARCINOMA. - SEE MICROSCOPIC DESCRIPTION. Microscopic Comment There are several microscopic foci of invasive mammary carcinoma, which appears high grade and is associated with dense lymphoplasmacytic infiltrate. E-cadherin immunohistochemistry will be performed and reported as an addendum. There is abundant skeletal muscle present, which is not involved by tumor in these biopsies. Called to [facility] on 05/26/2016. (JDP:ecj 05/26/2016) ADDENDUM: Immunohistochemistry for E-Cadherin shows diffuse strong positivity consistent with invasive ductal carcinoma. (JDP:kh 05-26-16) Claudette Laws MD Pathologist, Electronic Signature  Diagnosis 04-29-16 1. Breast, left, needle core biopsy, 2:00 o'clock - INVASIVE MAMMARY CARCINOMA, HIGH GRADE, FAVOR DUCTAL CARCINOMA. - CARCINOMA IN SITU, SOLID TYPE. 2. Lymph node, needle/core biopsy, left axilla - INVASIVE CARCINOMA METASTATIC TO LYMPH NODE(S). Microscopic Comment 2. Receptor studies will be performed. Dr. Saralyn Pilar concurs with the diagnosis. (MJ:kh 05-02-16)  Receptor Status: ER(95 %+), PR (0%-), Her2-neu (neg), Ki-(40 %)   Did patient present with symptoms (if so, please note symptoms) or was this found on screening mammography?:Routine mammogram on  04/19/16 showed possible asymmetry in the left breast followed by U/S and biopsy.  Past/Anticipated interventions by surgeon, if any:Dr. Rolm Bookbinder 11-03-16  Breast, lumpectomy, Left, 11-03-16 Lymph node, sentinel, biopsy, Left, 05-25-16 Breast, left, needle core biopsy, lateral far posterior  Past/Anticipated interventions by medical oncology, if any: Mammaprint, Chemotherapy doxorubicin and cyclophosphamide in dose dense fashion 4 starting 06/09/2016, completed 07/21/2016, followed by weekly paclitaxel started 08/11/2016 paclitaxel stopped after cycle 8 because of neuropathy; last dose 09/29/2016,adjuvant radiation pending,to resume anastrozole at the completion of adjuvant radiation                                                                                                                                                                                                Lymphedema issues, if any:   Patient states that she does have some lymphedema in her breast area. Pain issues, if any:  States that pain come and goes. States that she has bittening pain.  SAFETY ISSUES:  Prior radiation? :  No  Pacemaker/ICD? : No  Possible current pregnancy? : No Is the patient on methotrexate? :No Menarche age 56, first live birth age 69, the patient is [place] P4. She stopped having periods approximately 2006. She did not use hormone replacement. She used oral contraceptives briefly and remotely, with no complications. Current Complaints / other details:60  y.o. 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    Has a  family history of cancer her mother had breast cancer in her 54"s she died at 27, father prostate cancer who died at age 67. Vitals:   2016/12/14 0956 December 14, 2016 1001  BP: (!) 163/101 (!) 165/91  Pulse: 79   Resp: 20   Temp: 98.5 F (36.9 C)   TempSrc: Oral   SpO2: 100%   Weight: 185 lb 2 oz (84 kg)    Wt Readings from Last 3 Encounters:   12/14/2016 185 lb 2 oz (84 kg)  11/24/16 184 lb (83.5 kg)  10/27/16 184 lb 9.6 oz (83.7 kg)        Georgena Spurling, RN 11/28/2016,11:22 AM

## 2016-12-01 ENCOUNTER — Encounter: Payer: Self-pay | Admitting: Radiation Oncology

## 2016-12-01 ENCOUNTER — Ambulatory Visit
Admission: RE | Admit: 2016-12-01 | Discharge: 2016-12-01 | Disposition: A | Discharge status: Home / Self Care | Payer: BLUE CROSS/BLUE SHIELD | Source: Ambulatory Visit | Attending: Radiation Oncology | Admitting: Radiation Oncology

## 2016-12-01 VITALS — BP 165/91 | HR 79 | Temp 98.5°F | Resp 20 | Wt 185.1 lb

## 2016-12-01 DIAGNOSIS — R7303 Prediabetes: Secondary | ICD-10-CM | POA: Insufficient documentation

## 2016-12-01 DIAGNOSIS — G629 Polyneuropathy, unspecified: Secondary | ICD-10-CM | POA: Diagnosis not present

## 2016-12-01 DIAGNOSIS — Z6832 Body mass index (BMI) 32.0-32.9, adult: Secondary | ICD-10-CM | POA: Diagnosis not present

## 2016-12-01 DIAGNOSIS — Z803 Family history of malignant neoplasm of breast: Secondary | ICD-10-CM | POA: Diagnosis not present

## 2016-12-01 DIAGNOSIS — Z170 Estrogen receptor positive status [ER+]: Principal | ICD-10-CM

## 2016-12-01 DIAGNOSIS — Z79899 Other long term (current) drug therapy: Secondary | ICD-10-CM | POA: Insufficient documentation

## 2016-12-01 DIAGNOSIS — R110 Nausea: Secondary | ICD-10-CM | POA: Insufficient documentation

## 2016-12-01 DIAGNOSIS — E785 Hyperlipidemia, unspecified: Secondary | ICD-10-CM | POA: Diagnosis not present

## 2016-12-01 DIAGNOSIS — I10 Essential (primary) hypertension: Secondary | ICD-10-CM | POA: Diagnosis not present

## 2016-12-01 DIAGNOSIS — C50412 Malignant neoplasm of upper-outer quadrant of left female breast: Secondary | ICD-10-CM | POA: Diagnosis not present

## 2016-12-01 DIAGNOSIS — E669 Obesity, unspecified: Secondary | ICD-10-CM | POA: Insufficient documentation

## 2016-12-01 DIAGNOSIS — Z510 Encounter for antineoplastic radiation therapy: Secondary | ICD-10-CM | POA: Insufficient documentation

## 2016-12-01 DIAGNOSIS — Z9221 Personal history of antineoplastic chemotherapy: Secondary | ICD-10-CM | POA: Insufficient documentation

## 2016-12-01 DIAGNOSIS — Z888 Allergy status to other drugs, medicaments and biological substances status: Secondary | ICD-10-CM | POA: Diagnosis not present

## 2016-12-01 DIAGNOSIS — F419 Anxiety disorder, unspecified: Secondary | ICD-10-CM | POA: Insufficient documentation

## 2016-12-01 DIAGNOSIS — M1990 Unspecified osteoarthritis, unspecified site: Secondary | ICD-10-CM | POA: Diagnosis not present

## 2016-12-01 DIAGNOSIS — C773 Secondary and unspecified malignant neoplasm of axilla and upper limb lymph nodes: Secondary | ICD-10-CM | POA: Diagnosis not present

## 2016-12-01 DIAGNOSIS — Z9889 Other specified postprocedural states: Secondary | ICD-10-CM | POA: Diagnosis not present

## 2016-12-01 NOTE — Progress Notes (Signed)
Radiation Oncology         [phone] ________________________________  Name: Tanya [name] MRN: [id number]  Date: 12/01/2016  DOB: 09/21/1955  HY:IFOY, Ander Purpura, MD  Magrinat, Virgie Dad, MD     REFERRING PHYSICIAN: Magrinat, Virgie Dad, MD   DIAGNOSIS: The encounter diagnosis was Malignant neoplasm of upper-outer quadrant of left breast in female, estrogen receptor positive ([place]).   HISTORY OF PRESENT ILLNESS: Tanya [name] is a 61 y.o. female who was originally seen at the multidisciplinary clinic for newly diagnosed left sided breast cancer. Routine mammogram on 04/19/16 showed possible asymmetry in the left breast. Ultrasound on 04/29/16 revealed a highly suspicious mass within the upper-outer quadrant of the left breast at the 2 o'clock position and an enlarged left axillary lymph node. 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 Receptor status is ER (95%) PR (0%) Her2 (-) and Ki67 (40%).  Her biopsy was tested with mammaprint, and it was felt that she was high risk indicating a role for neoadjuvant chemotherapy which she completed between 3/39/18-09/29/16. She had an MRI On 10/15/16 which revealed no evidence of residual breast enhancement, and decrease in the size of the known left axillary adenopathy. No new disease was noted and she underwent left lumpectomy with sentinel node evaluation on 11/03/16. There was no residual tumor, and 1/2 sentinel nodes were noted to be involved by her cancer. She comes today to discuss options of radiotherapy.    PREVIOUS RADIATION THERAPY: No   PAST MEDICAL HISTORY:  Past Medical History:  Diagnosis Date  . ANXIETY   . Arthritis   . Dyspnea on exertion   . HYPERLIPIDEMIA   . HYPERTENSION, BENIGN ESSENTIAL   . Malignant neoplasm of upper-outer quadrant of left  breast in female, estrogen receptor positive ([place]) 05/04/2016  . OBESITY   . Pre-diabetes    no medications       PAST SURGICAL HISTORY: Past Surgical History:  Procedure Laterality Date  . BREAST LUMPECTOMY WITH RADIOACTIVE SEED AND SENTINEL LYMPH NODE BIOPSY Left 11/03/2016   Procedure: INJECT BLUE DYE LEFT BREAST, LEFT BREAST RADIOACTIVE SEED GUIDED LUMPECTOMY WITH LEFT SENTINEL LYMPH NODE BIOPSY AND RADIOACTIVE SEED TARGETED AXILLARY LYMPH NODE EXCISION;  Surgeon: Rolm Bookbinder, MD;  Location: [facility];  Service: General;  Laterality: Left;  . CESAREAN SECTION    . PORT-A-CATH REMOVAL Right 11/03/2016   Procedure: REMOVAL PORT-A-CATH;  Surgeon: Rolm Bookbinder, MD;  Location: [facility];  Service: General;  Laterality: Right;  . PORTACATH PLACEMENT Right 06/02/2016   Procedure: INSERTION PORT-A-CATH WITH ULTRASOUND GUIDANCE;  Surgeon: Rolm Bookbinder, MD;  Location: [facility];  Service: General;  Laterality: Right;  . TUBAL LIGATION       FAMILY HISTORY:  Family History  Problem Relation Age of Onset  . Breast cancer Mother 3     SOCIAL HISTORY:  reports that she has never smoked. She has never used smokeless tobacco. She reports that she drinks alcohol. She reports that she does not use drugs. The patient is married and lives in [place]. 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 She has 4 children.   ALLERGIES: Ascorbic acid & derivatives and Camoquin [amodiaquine]   MEDICATIONS:  Current Outpatient Prescriptions  Medication Sig Dispense Refill  . acetaminophen (TYLENOL) 500 MG tablet Take 1,000 mg  by mouth every 6 (six) hours as needed (for pain.).    Marland Kitchen lidocaine-prilocaine (EMLA) cream Apply to affected area once (Patient not taking: Reported on 11/24/2016) 30 g 3  . losartan (COZAAR) 50 MG tablet Take 50 mg by mouth at bedtime.  90 tablet 2  . methocarbamol (ROBAXIN) 750 MG tablet Take 1 tablet (750 mg total) by mouth 4 (four) times  daily as needed (use for muscle cramps/pain). (Patient not taking: Reported on 11/24/2016) 15 tablet 0  . Multiple Vitamin (MULTIVITAMIN WITH MINERALS) TABS tablet Take 1 tablet by mouth 3 (three) times a week. One-A-Day Women's 50+    . neomycin-bacitracin-polymyxin (NEOSPORIN) ointment Apply 1 application topically 2 (two) times daily as needed (for boil wound care).    Marland Kitchen omeprazole (PRILOSEC) 40 MG capsule Take 1 capsule (40 mg total) by mouth daily. (Patient not taking: Reported on 10/21/2016) 90 capsule 3  . oxyCODONE (OXY IR/ROXICODONE) 5 MG immediate release tablet Take 1 tablet (5 mg total) by mouth every 6 (six) hours as needed for moderate pain, severe pain or breakthrough pain. (Patient not taking: Reported on 11/24/2016) 15 tablet 0  . pravastatin (PRAVACHOL) 40 MG tablet TAKE 1 TABLET (40 MG TOTAL) BY MOUTH AT BEDTIME. 90 tablet 2  . prochlorperazine (COMPAZINE) 10 MG tablet Take 1 tablet (10 mg total) by mouth every 6 (six) hours as needed (Nausea or vomiting). (Patient not taking: Reported on 10/21/2016) 30 tablet 1  . simethicone (MYLICON) 80 MG chewable tablet Chew 80 mg by mouth every 6 (six) hours as needed (for gas).      No current facility-administered medications for this encounter.      REVIEW OF SYSTEMS: On review of systems, the patient reports that she is doing well overall. She denies any chest pain, shortness of breath, cough, fevers, chills, night sweats, unintended weight changes. She stopped taking her PPI a few weeks ago and notes that she's had some nausea intermittently. She also notes peripheral neuropathy.  She denies any bowel or bladder disturbances, and denies abdominal pain, or vomiting. She denies any new musculoskeletal or joint aches or pains, new skin lesions or concerns. A complete review of systems is obtained and is otherwise negative.    PHYSICAL EXAM:  Wt Readings from Last 3 Encounters:  11/24/16 184 lb (83.5 kg)  10/27/16 184 lb 9.6 oz (83.7 kg)    10/25/16 186 lb (84.4 kg)   Temp Readings from Last 3 Encounters:  11/24/16 98.6 F (37 C) (Oral)  11/03/16 97.7 F (36.5 C)  10/27/16 98.6 F (37 C) (Oral)   BP Readings from Last 3 Encounters:  11/24/16 (!) 157/101  11/03/16 (!) 165/93  10/27/16 137/83   Pulse Readings from Last 3 Encounters:  11/24/16 88  11/03/16 77  10/27/16 95    In general this is a well appearing African female in no acute distress. She is alert and oriented x4 and appropriate throughout the examination. HEENT reveals that the patient is normocephalic, atraumatic. EOMs are intact. PERRLA. Skin is intact without any evidence of gross lesions.Cardiopulmonary assessment is negative for acute distress and she exhibits normal effort. The left breast is examined and reveals well healing lumpectomy and axillary incisions without chest wall edema or LUE edema.  ECOG = 0  0 - Asymptomatic (Fully active, able to carry on all predisease activities without restriction)  1 - Symptomatic but completely ambulatory (Restricted in physically strenuous activity but ambulatory and able to carry out work of a light or  sedentary nature. For example, light housework, office work)  2 - Symptomatic, <50% in bed during the day (Ambulatory and capable of all self care but unable to carry out any work activities. Up and about more than 50% of waking hours)  3 - Symptomatic, >50% in bed, but not bedbound (Capable of only limited self-care, confined to bed or chair 50% or more of waking hours)  4 - Bedbound (Completely disabled. Cannot carry on any self-care. Totally confined to bed or chair)  5 - Death   Eustace Pen MM, Creech RH, Tormey DC, et al. [phone]). "Toxicity and response criteria of the [facility]". [place] Oncol. 5 (6): 649-55    LABORATORY DATA:  Lab Results  Component Value Date   WBC 6.1 10/25/2016   HGB 11.3 (L) 10/25/2016   HCT 34.9 (L) 10/25/2016   MCV 81.2 10/25/2016   PLT 208  10/25/2016   Lab Results  Component Value Date   NA 140 10/25/2016   K 3.6 10/25/2016   CL 107 10/25/2016   CO2 25 10/25/2016   Lab Results  Component Value Date   ALT 17 10/06/2016   AST 21 10/06/2016   ALKPHOS 64 10/06/2016   BILITOT 0.67 10/06/2016      RADIOGRAPHY: Mm Breast Surgical Specimen  Result Date: 11/03/2016 CLINICAL DATA:  Status post radioactive seed localization and lumpectomy left breast. EXAM: SPECIMEN RADIOGRAPH OF THE LEFT BREAST COMPARISON:  Previous exam(s). FINDINGS: Status post excision of the left breast. There are 2 radioactive seeds and 4 tissue marker clips (ribbon, coil, bleeding, and spiral shaped) within the specimen submitted. These are marked for pathology. IMPRESSION: Specimen radiograph of the left breast. Electronically Signed   By: Nolon Nations M.D.   On: 11/03/2016 12:03   Mm Breast Surgical Specimen  Result Date: 11/03/2016 CLINICAL DATA:  Status post excision of left axillary lymph node following radioactive seed localization. EXAM: SPECIMEN RADIOGRAPH OF THE LEFT AXILLA COMPARISON:  Previous exam(s). FINDINGS: Status post excision of the left axilla. The radioactive seed and biopsy marker clip are present, completely intact, and were marked for pathology. IMPRESSION: Specimen radiograph of the left axilla. Electronically Signed   By: Nolon Nations M.D.   On: 11/03/2016 12:01   Mm Lt Radioactive Seed Loc Mammo Guide  Result Date: 11/01/2016 CLINICAL DATA:  Patient for preoperative localization prior to left breast lumpectomy and lymph node removal. EXAM: MAMMOGRAPHIC GUIDED RADIOACTIVE SEED LOCALIZATION OF THE LEFT BREAST COMPARISON:  Previous exam(s). FINDINGS: Patient presents for radioactive seed localization prior to left breast lumpectomy. I met with the patient and we discussed the procedure of seed localization including benefits and alternatives. We discussed the high likelihood of a successful procedure. We discussed the risks of the  procedure including infection, bleeding, tissue injury and further surgery. We discussed the low dose of radioactivity involved in the procedure. Informed, written consent was given. The usual time-out protocol was performed immediately prior to the procedure. Using mammographic guidance, sterile technique, 1% lidocaine and an I-125 radioactive seed, coil shaped marking clip within the lateral left breast was localized using a lateral approach. The follow-up mammogram images confirm the seed in the expected location and were marked for Dr. Donne Hazel. Follow-up survey of the patient confirms presence of the radioactive seed. Order number of I-125 seed:  [id number]. Total activity:  0.092 millicuries  Reference Date: 10/20/2016 The patient tolerated the procedure well and was released from the [facility]. She was given instructions regarding seed removal.  IMPRESSION: Radioactive seed localization left breast. No apparent complications. Electronically Signed   By: Lovey Newcomer M.D.   On: 11/01/2016 16:14   [place] Lt Radioactive Seed Loc  Result Date: 11/01/2016 CLINICAL DATA:  Patient for preoperative localization prior to left lymph node excision. EXAM: ULTRASOUND GUIDED RADIOACTIVE SEED LOCALIZATION OF THE LEFT BREAST COMPARISON:  Previous exam(s). FINDINGS: Patient presents for radioactive seed localization prior to left lymph node excision. I met with the patient and we discussed the procedure of seed localization including benefits and alternatives. We discussed the high likelihood of a successful procedure. We discussed the risks of the procedure including infection, bleeding, tissue injury and further surgery. We discussed the low dose of radioactivity involved in the procedure. Informed, written consent was given. The usual time-out protocol was performed immediately prior to the procedure. Using ultrasound guidance, sterile technique, 1% lidocaine and an I-125 radioactive seed, HydroMARK clip and lymph node  within the left axilla was localized using a lateral approach. The follow-up mammogram images confirm the seed in the expected location and were marked for Dr. Donne Hazel. Follow-up survey of the patient confirms presence of the radioactive seed. Order number of I-125 seed:  [id number]. Total activity:  1.601 millicurie         Reference Date: 10/20/2016 The patient tolerated the procedure well and was released from the [place] [facility]. She was given instructions regarding seed removal. IMPRESSION: Radioactive seed localization left axilla. No apparent complications. Electronically Signed   By: Lovey Newcomer M.D.   On: 11/01/2016 16:15   [place] Lt Radioactive Seed Ea Add Lesion  Result Date: 11/01/2016 CLINICAL DATA:  Patient for preoperative localization prior to left breast lumpectomy of mass identified on MRI and biopsied using MRI guidance within the posterolateral left breast. EXAM: ULTRASOUND GUIDED RADIOACTIVE SEED LOCALIZATION OF THE LEFT BREAST COMPARISON:  Previous exam(s). FINDINGS: Patient presents for radioactive seed localization prior to left breast lumpectomy. I met with the patient and we discussed the procedure of seed localization including benefits and alternatives. We discussed the high likelihood of a successful procedure. We discussed the risks of the procedure including infection, bleeding, tissue injury and further surgery. We discussed the low dose of radioactivity involved in the procedure. Informed, written consent was given. The usual time-out protocol was performed immediately prior to the procedure. Using ultrasound guidance, sterile technique, 1% lidocaine and an I-125 radioactive seed, HydroMARK clip within the posterior left breast was localized using a lateral approach. The follow-up mammogram images confirm the seed in the expected location and were marked for Dr. Donne Hazel. Follow-up survey of the patient confirms presence of the radioactive seed. Order number of I-125 seed:   [id number]. Total activity: 2.202 millicuries Reference Date: 10/20/2016 The patient tolerated the procedure well and was released from the [facility]. She was given instructions regarding seed removal. IMPRESSION: Radioactive seed localization left breast. No apparent complications. Electronically Signed   By: Lovey Newcomer M.D.   On: 11/01/2016 16:16   Mm Clip Placement Left  Result Date: 11/01/2016 CLINICAL DATA:  Patient with left breast malignancy and metastatic left axillary adenopathy, for preoperative localization. EXAM: DIAGNOSTIC LEFT MAMMOGRAM POST ULTRASOUND-GUIDED AND MAMMOGRAM GUIDED RADIOACTIVE SEED PLACEMENT COMPARISON:  Previous exam(s). FINDINGS: Mammographic images were obtained following ultrasound-guided radioactive seed placement. These demonstrate the following: Site 1: Radioactive seed to be located within the left axillary lymph node adjacent to the [facility] clip. Site 2: Radioactive seed to be located adjacent to the coil shaped marking clip within the lateral  left breast at the site of primary breast malignancy. Site 3: Radioactive seed to be located adjacent to the [facility] clip within the posterolateral left breast at the site of MRI guided core needle biopsy demonstrating malignancy. Note there is a ribbon shaped marking clip and a wing shaped marking clip which do not need to be removed as they do not mark any sites of biopsied malignancy. IMPRESSION: Appropriate location of the radioactive seeds. Final Assessment: Post Procedure Mammograms for Seed Placement Electronically Signed   By: Lovey Newcomer M.D.   On: 11/01/2016 16:21       IMPRESSION/PLAN: 1. Stage IIB, [id number], ER/PR positive, grade 3, invasive ductal carcinoma of the left breast. Dr. Lisbeth Renshaw discusses the pathology findings and reviews the nature of invasive breast disease. She has completed her course of chemotherapy and surgery and is ready to move forward with radiotherapy. We discussed the risks, benefits,  short, and long term effects of radiotherapy, and the patient is interested in proceeding. Dr. Lisbeth Renshaw discusses the delivery and logistics of radiotherapy. Dr. Lisbeth Renshaw reviews that he would recommend a course of 6 1/2 weeks of radiotherapy to the breast and regional nodes, and we would use breath hold technique to avoid exposure to the heart as her disease is left sided. Written consent is obtained and placed in the chart, a copy was provided to the patient. She will return next week for simulation. 2. Intermittent nausea. I suggested that she start her PPI back, and if this does not improve, she should see her PCP for a GI referral, or let us know so we can refer her for evaluation.  In a visit lasting 25 minutes, greater than 50% of the time was spent face to face discussing the role and logistics of disease., and coordinating the patient's care.  The above documentation reflects my direct findings during this shared patient visit. Please see the separate note by Dr. Lisbeth Renshaw on this date for the remainder of the patient's plan of care.    Carola Rhine, PAC

## 2016-12-01 NOTE — Addendum Note (Signed)
Encounter addended by: Sherrlyn Hock, LPN on: 7/68/0881  1:03 PM<BR>    Actions taken: Actions taken from a BestPractice Advisory, Visit Navigator Flowsheet section accepted, Order list changed, Diagnosis association updated

## 2016-12-05 ENCOUNTER — Telehealth: Payer: Self-pay | Admitting: *Deleted

## 2016-12-05 NOTE — Telephone Encounter (Signed)
Returned call to patient [phone], she stated she had spoke with someone here and has appt with Dr. Lisbeth Renshaw already , but I didn't see anything scheduled as yet,trasnferrd call to Leanord Hawking 3:49 PM

## 2016-12-06 ENCOUNTER — Ambulatory Visit: Payer: BLUE CROSS/BLUE SHIELD | Attending: General Surgery | Admitting: Physical Therapy

## 2016-12-06 DIAGNOSIS — R293 Abnormal posture: Secondary | ICD-10-CM | POA: Diagnosis not present

## 2016-12-06 DIAGNOSIS — Z483 Aftercare following surgery for neoplasm: Secondary | ICD-10-CM | POA: Insufficient documentation

## 2016-12-06 DIAGNOSIS — R222 Localized swelling, mass and lump, trunk: Secondary | ICD-10-CM | POA: Insufficient documentation

## 2016-12-06 NOTE — Therapy (Addendum)
[facility] [address] [place], [place], [address] Phone: [phone]   Fax:  [phone]  Physical Therapy Evaluation  Patient Details  Name: Tanya [name] MRN: [id number] Date of Birth: 13-Jan-1956 Referring Provider: Dr. Dwain Sarna   Encounter Date: 12/06/2016      PT End of Session - 12/06/16 1546    Visit Number 1   Number of Visits 9   Date for PT Re-Evaluation 02/05/17  pt may have difficulty with scheduling visits   PT Start Time 1100   PT Stop Time 1145   PT Time Calculation (min) 45 min   Activity Tolerance Patient tolerated treatment well   Behavior During Therapy [facility] for tasks assessed/performed      Past Medical History:  Diagnosis Date  . ANXIETY   . Arthritis   . Dyspnea on exertion   . HYPERLIPIDEMIA   . HYPERTENSION, BENIGN ESSENTIAL   . Malignant neoplasm of upper-outer quadrant of left breast in female, 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  . OBESITY   . Pre-diabetes    no medications    Past Surgical History:  Procedure Laterality Date  . BREAST LUMPECTOMY WITH RADIOACTIVE SEED AND SENTINEL LYMPH NODE BIOPSY Left 11/03/2016   Procedure: INJECT BLUE DYE LEFT BREAST, LEFT BREAST RADIOACTIVE SEED GUIDED LUMPECTOMY WITH LEFT SENTINEL LYMPH NODE BIOPSY AND RADIOACTIVE SEED TARGETED AXILLARY LYMPH NODE EXCISION;  Surgeon: Emelia Loron, MD;  Location: [facility] OR;  Service: General;  Laterality: Left;  . CESAREAN SECTION    . PORT-A-CATH REMOVAL Right 11/03/2016   Procedure: REMOVAL PORT-A-CATH;  Surgeon: Emelia Loron, MD;  Location: [facility];  Service: General;  Laterality: Right;  . PORTACATH PLACEMENT Right 06/02/2016   Procedure: INSERTION PORT-A-CATH WITH ULTRASOUND GUIDANCE;  Surgeon: Emelia Loron, MD;  Location: [facility];  Service: General;  Laterality: Right;  . TUBAL LIGATION      There were no vitals filed for this visit.       Subjective Assessment -  12/06/16 1113    Subjective Dr. Dwain Sarna sent me here. She a little pain and swelling in her left breast that occasionally shoots to nipple    Pertinent History left brest lumpectomy with 2 nodes removed on 11/03/2016  she also had the port removed at that time.  She has already chemotherapy with numbness and tingling in her finger and occasionally her toes She is able to move around ok  She plans to start radition in 2 weeks    Patient Stated Goals to get rid of the pain    Currently in Pain? No/denies  dullness in fingertips from neuropathy             [facility] PT Assessment - 12/06/16 0001      Assessment   Medical Diagnosis left breast cancer    Referring Provider Dr. Dwain Sarna    Onset Date/Surgical Date 11/03/16   Hand Dominance Right     Precautions   Precautions Other (comment)   Precaution Comments sometimes affected by neuropathy      Restrictions   Weight Bearing Restrictions No     Balance Screen   Has the patient fallen in the past 6 months No   Has the patient had a decrease in activity level because of a fear of falling?  No   Is the patient reluctant to leave their home because of a fear of falling?  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    Leisure does not exercise, tries to increas distances within her life      Cognition   Overall Cognitive Status Within Functional Limits for tasks assessed     Observation/Other Assessments   Observations healed scar in left axilla and left breast    Quick DASH  0     Coordination   Gross Motor Movements are Fluid and Coordinated Yes     Posture/Postural Control   Posture/Postural Control Postural limitations   Postural Limitations Rounded  Shoulders;Forward head     AROM   Right Shoulder Flexion 160 Degrees   Right Shoulder ABduction 160 Degrees   Left Shoulder Flexion 165 Degrees   Left Shoulder ABduction 160 Degrees     Strength   Overall Strength Comments pt feels overall general weakness and fatigue from chemotherapy     Palpation   Palpation comment fullness around each incision, that is tender            LYMPHEDEMA/ONCOLOGY QUESTIONNAIRE - 12/06/16 1143      Right Upper Extremity Lymphedema   10 cm Proximal to Olecranon Process 30 cm   Olecranon Process 26 cm   15 cm Proximal to Ulnar Styloid Process 26 cm   Just Proximal to Ulnar Styloid Process 15.5 cm   Across Hand at [employer] 18.5 cm   At [place] of 2nd Digit 6.1 cm     Left Upper Extremity Lymphedema   10 cm Proximal to Olecranon Process 30 cm   Olecranon Process 25.8 cm   15 cm Proximal to Ulnar Styloid Process 25 cm   Just Proximal to Ulnar Styloid Process 15.5 cm   Across Hand at [employer] 18.5 cm   At [place] of 2nd Digit 6 cm           Quick Dash - 12/06/16 0001    Open a tight or new jar No difficulty   Do heavy household chores (wash walls, wash floors) No difficulty   Carry a shopping bag or briefcase No difficulty   Wash your back No difficulty   Use a knife to cut food No difficulty   Recreational activities in which you take some force or impact through your arm, shoulder, or hand (golf, hammering, tennis) No difficulty   During the past week, to what extent has your arm, shoulder or hand problem interfered with your normal social activities with family, friends, neighbors, or groups? Not at all   During the past week, to what extent has your arm, shoulder or hand problem limited your work or other regular daily activities Not at all   Arm, shoulder, or hand pain. None   Tingling (pins and needles) in your arm, shoulder, or hand None   Difficulty Sleeping No difficulty   DASH Score 0 %      Objective measurements  completed on examination: See above findings.          [facility] Adult PT Treatment/Exercise - 12/06/16 0001      Self-Care   Self-Care Other Self-Care Comments   Other Self-Care Comments  showed pt sample of what to look for in a compression bra. She already has a prescription from Dr. Jana Hakim  PT Education - 12/06/16 1254    Education provided Yes   Education Details what to look for in a compression bra.  Issued flyer for [employer] class and Engineer, technical sales) Educated Patient   Methods Explanation;Handout   Comprehension Verbalized understanding           Short Term Clinic Goals - 12/06/16 1554      CC Short Term Goal  #1   Title short term goals are equal to long term goals              Long Term Clinic Goals - 12/06/16 1548      CC Long Term Goal  #1   Title Pt will be independent in self manual lymph drainage to left breast with scar mobilization   Time 8   Period Weeks   Status New     CC Long Term Goal  #2   Title Pt will be knowledgable of lymphedema risk reduction practices and how to get a compression sleeve for prophylaxis    Time 8   Period Weeks   Status New     CC Long Term Goal  #3   Title Pt will report the discomfort she feels in her breast and axilla is decreased by 50%    Time 8   Period Weeks   Status New             Plan - 12/06/16 1255    Clinical Impression Statement 61 yo female s/p chemo and lumpectomy with sentinel node biopsy for left breast cancer who is having pain and fullness in left breast especiailly around incisions.   History and Personal Factors relevant to plan of care: chemotherapy with neuropathy symptoms    Clinical Presentation Evolving   Clinical Presentation due to: will be having radiation   Clinical Decision Making Moderate   Rehab Potential Good   Clinical Impairments Affecting Rehab Potential previous chemo, deep node removal    PT Frequency 2x / week   PT Duration 8  weeks  pt will not be able to come to all appts    PT Treatment/Interventions ADLs/Self Care Home Management;Manual lymph drainage;Compression bandaging;Therapeutic exercise;DME Instruction;Orthotic Fit/Training;Patient/family education;Taping;Manual techniques;Therapeutic activities;Scar mobilization   PT Next Visit Plan teach self manual lymph drainage to left breast,, scar mobilizaion to left breast and axillary scars consider foam patch to wear in bra., make sure pt is signed up for ABC class, assist with getting prophylactic compression sleeve possibly Strength ABC class vs [employer]    Consulted and Agree with Plan of Care Patient      Patient will benefit from skilled therapeutic intervention in order to improve the following deficits and impairments:  Increased edema, Pain, Increased fascial restricitons, Decreased strength, Decreased knowledge of use of DME, Decreased knowledge of precautions, Postural dysfunction, Decreased scar mobility  Visit Diagnosis: Aftercare following surgery for neoplasm - Plan: PT plan of care cert/re-cert  Abnormal posture - Plan: PT plan of care cert/re-cert  Localized swelling, mass and lump, trunk - Plan: PT plan of care cert/re-cert     Problem List Patient Active Problem List   Diagnosis Date Noted  . Malignant neoplasm of upper-outer quadrant of left breast in female, estrogen receptor positive ([place]) 05/04/2016  . Encounter for preventative adult health care examination 01/26/2016  . Prediabetes 06/16/2014  . Blurry vision, bilateral 06/16/2014  . Seasonal allergies 05/14/2014  . Murmur 02/25/2013  . Dyspnea on exertion 11/29/2012  . Hyperlipidemia 02/10/2009  .  Obesity 02/10/2009  . Anxiety state 02/10/2009  . HYPERTENSION, BENIGN ESSENTIAL 06/01/2006   Donato Heinz. Owens Shark, PT  [place] Levo 12/06/2016, 3:58 PM  [place], [place], [address] Phone:  [phone]   Fax:  [phone]  Name: Tanya [name] MRN: [id number] Date of Birth: [birth year] PHYSICAL THERAPY DISCHARGE SUMMARY  Visits from Start of Care: 1  Current functional level related to goals / functional outcomes: unknown   Remaining deficits: Unknown    Education / Equipment: As above  Plan: Patient agrees to discharge.  Patient goals were not met. Patient is being discharged due to not returning since the last visit.  ?????    Maudry Diego, PT 09/01/17 10:45 AM

## 2016-12-09 ENCOUNTER — Ambulatory Visit
Admission: RE | Admit: 2016-12-09 | Discharge: 2016-12-09 | Disposition: A | Discharge status: Home / Self Care | Payer: BLUE CROSS/BLUE SHIELD | Source: Ambulatory Visit | Attending: Radiation Oncology | Admitting: Radiation Oncology

## 2016-12-09 DIAGNOSIS — Z6832 Body mass index (BMI) 32.0-32.9, adult: Secondary | ICD-10-CM | POA: Diagnosis not present

## 2016-12-09 DIAGNOSIS — Z170 Estrogen receptor positive status [ER+]: Principal | ICD-10-CM

## 2016-12-09 DIAGNOSIS — E669 Obesity, unspecified: Secondary | ICD-10-CM | POA: Diagnosis not present

## 2016-12-09 DIAGNOSIS — C50412 Malignant neoplasm of upper-outer quadrant of left female breast: Secondary | ICD-10-CM

## 2016-12-09 DIAGNOSIS — Z888 Allergy status to other drugs, medicaments and biological substances status: Secondary | ICD-10-CM | POA: Diagnosis not present

## 2016-12-09 DIAGNOSIS — Z79899 Other long term (current) drug therapy: Secondary | ICD-10-CM | POA: Diagnosis not present

## 2016-12-09 DIAGNOSIS — F419 Anxiety disorder, unspecified: Secondary | ICD-10-CM | POA: Diagnosis not present

## 2016-12-09 DIAGNOSIS — R110 Nausea: Secondary | ICD-10-CM | POA: Diagnosis not present

## 2016-12-09 DIAGNOSIS — C773 Secondary and unspecified malignant neoplasm of axilla and upper limb lymph nodes: Secondary | ICD-10-CM | POA: Diagnosis not present

## 2016-12-09 DIAGNOSIS — Z510 Encounter for antineoplastic radiation therapy: Secondary | ICD-10-CM | POA: Diagnosis not present

## 2016-12-09 DIAGNOSIS — E785 Hyperlipidemia, unspecified: Secondary | ICD-10-CM | POA: Diagnosis not present

## 2016-12-09 DIAGNOSIS — R7303 Prediabetes: Secondary | ICD-10-CM | POA: Diagnosis not present

## 2016-12-09 DIAGNOSIS — G629 Polyneuropathy, unspecified: Secondary | ICD-10-CM | POA: Diagnosis not present

## 2016-12-09 DIAGNOSIS — I10 Essential (primary) hypertension: Secondary | ICD-10-CM | POA: Diagnosis not present

## 2016-12-09 DIAGNOSIS — Z803 Family history of malignant neoplasm of breast: Secondary | ICD-10-CM | POA: Diagnosis not present

## 2016-12-09 DIAGNOSIS — M1990 Unspecified osteoarthritis, unspecified site: Secondary | ICD-10-CM | POA: Diagnosis not present

## 2016-12-12 NOTE — Progress Notes (Signed)
  Radiation Oncology         [phone] ________________________________  Name: Tanya [name] MRN: [id number]  Date: 12/09/2016  DOB: [birth year]  Optical Surface Tracking Plan:  Since intensity modulated radiotherapy (IMRT) and 3D conformal radiation treatment methods are predicated on accurate and precise positioning for treatment, intrafraction motion monitoring is medically necessary to ensure accurate and safe treatment delivery.  The ability to quantify intrafraction motion without excessive ionizing radiation dose can only be performed with optical surface tracking. Accordingly, surface imaging offers the opportunity to obtain 3D measurements of patient position throughout IMRT and 3D treatments without excessive radiation exposure.  I am ordering optical surface tracking for this patient's upcoming course of radiotherapy. ________________________________  Kyung Rudd, MD 12/12/2016 9:13 AM    Reference:   Ursula Alert, J, et al. Surface imaging-based analysis of intrafraction motion for breast radiotherapy patients.Journal of [place], n. 6, nov. 2014. ISSN [id number].   Available at: <http://www.jacmp.org/index.php/jacmp/article/view/4957>.

## 2016-12-12 NOTE — Progress Notes (Signed)
  Radiation Oncology         [phone] ________________________________  Name: Tanya [name] MRN: [id number]  Date: 12/09/2016  DOB: Jun 09, 1955  Diagnosis DIAGNOSIS:     ICD-10-CM   1. Malignant neoplasm of upper-outer quadrant of left breast in female, estrogen receptor positive ([facility] [place]) C50.412    Z17.0      SIMULATION AND TREATMENT PLANNING NOTE  The patient presented for simulation prior to beginning her course of radiation treatment for her diagnosis of left-sided breast cancer. The patient was placed in a supine position on a breast board. A customized vac-lock bag was also constructed and this complex treatment device will be used on a daily basis during her treatment. In this fashion, a CT scan was obtained through the chest area and an isocenter was placed near the chest wall at the upper aspect of the right chest. A breath-hold technique has also been evaluated to determine if this significantly improves the spatial relationship between the target region and the heart. Based on this analysis, a breath-hold technique has been ordered for the patient's treatment.  The patient will be planned to receive a course of radiation initially to a dose of 50.4 gray. This will consist of a 4 field technique targeting the left breast as well as the supraclavicular region. Therefore 2 customized medial and lateral tangent fields have been created targeting the chest wall, and also 2 additional customized fields have been designed to treat the supraclavicular region both with a left supraclavicular field and a left posterior axillary boost field. A forward planning/reduced field technique will also be evaluated to determine if this significantly improves the dose homogeneity of the overall plan. Therefore, additional customized blocks/fields may be necessary.  This initial treatment will be accomplished at 1.8 gray per fraction.   The initial plan will consist of a 3-D conformal technique. The  target volume/scar, heart and lungs have been contoured and dose volume histograms of each of these structures will be evaluated as part of the 3-D conformal treatment planning process.   It is anticipated that the patient will then receive a 10 gray boost to the surgical scar. This will be accomplished at 2 gray per fraction. The final anticipated total dose therefore will correspond to 60.4 gray.   Special treatment procedure was performed today due to the extra time and effort required by myself to plan and prepare this patient for deep inspiration breath hold technique.  I have determined cardiac sparing to be of benefit to this patient to prevent long term cardiac damage due to radiation of the heart.  Bellows were placed on the patient's abdomen. To facilitate cardiac sparing, the patient was coached by the radiation therapists on breath hold techniques and breathing practice was performed. Practice waveforms were obtained. The patient was then scanned while maintaining breath hold in the treatment position.  This image was then transferred over to the imaging specialist. The imaging specialist then created a fusion of the free breathing and breath hold scans using the chest wall as the stable structure. I personally reviewed the fusion in axial, coronal and sagittal image planes.  Excellent cardiac sparing was obtained.  I felt the patient is an appropriate candidate for breath hold and the patient will be treated as such.  The image fusion was then reviewed with the patient to reinforce the necessity of reproducible breath hold.      _______________________________   Jodelle Gross, MD, PhD

## 2016-12-16 ENCOUNTER — Ambulatory Visit: Payer: BLUE CROSS/BLUE SHIELD | Admitting: Radiation Oncology

## 2016-12-16 DIAGNOSIS — G629 Polyneuropathy, unspecified: Secondary | ICD-10-CM | POA: Diagnosis not present

## 2016-12-16 DIAGNOSIS — E669 Obesity, unspecified: Secondary | ICD-10-CM | POA: Diagnosis not present

## 2016-12-16 DIAGNOSIS — F419 Anxiety disorder, unspecified: Secondary | ICD-10-CM | POA: Diagnosis not present

## 2016-12-16 DIAGNOSIS — E785 Hyperlipidemia, unspecified: Secondary | ICD-10-CM | POA: Diagnosis not present

## 2016-12-16 DIAGNOSIS — R110 Nausea: Secondary | ICD-10-CM | POA: Diagnosis not present

## 2016-12-16 DIAGNOSIS — M1990 Unspecified osteoarthritis, unspecified site: Secondary | ICD-10-CM | POA: Diagnosis not present

## 2016-12-16 DIAGNOSIS — Z6832 Body mass index (BMI) 32.0-32.9, adult: Secondary | ICD-10-CM | POA: Diagnosis not present

## 2016-12-16 DIAGNOSIS — Z79899 Other long term (current) drug therapy: Secondary | ICD-10-CM | POA: Diagnosis not present

## 2016-12-16 DIAGNOSIS — Z803 Family history of malignant neoplasm of breast: Secondary | ICD-10-CM | POA: Diagnosis not present

## 2016-12-16 DIAGNOSIS — Z170 Estrogen receptor positive status [ER+]: Secondary | ICD-10-CM | POA: Diagnosis not present

## 2016-12-16 DIAGNOSIS — I10 Essential (primary) hypertension: Secondary | ICD-10-CM | POA: Diagnosis not present

## 2016-12-16 DIAGNOSIS — C773 Secondary and unspecified malignant neoplasm of axilla and upper limb lymph nodes: Secondary | ICD-10-CM | POA: Diagnosis not present

## 2016-12-16 DIAGNOSIS — Z888 Allergy status to other drugs, medicaments and biological substances status: Secondary | ICD-10-CM | POA: Diagnosis not present

## 2016-12-16 DIAGNOSIS — R7303 Prediabetes: Secondary | ICD-10-CM | POA: Diagnosis not present

## 2016-12-16 DIAGNOSIS — Z510 Encounter for antineoplastic radiation therapy: Secondary | ICD-10-CM | POA: Diagnosis not present

## 2016-12-16 DIAGNOSIS — C50412 Malignant neoplasm of upper-outer quadrant of left female breast: Secondary | ICD-10-CM | POA: Diagnosis not present

## 2016-12-19 ENCOUNTER — Ambulatory Visit
Admission: RE | Admit: 2016-12-19 | Discharge: 2016-12-19 | Disposition: A | Discharge status: Home / Self Care | Payer: BLUE CROSS/BLUE SHIELD | Source: Ambulatory Visit | Attending: Radiation Oncology | Admitting: Radiation Oncology

## 2016-12-19 ENCOUNTER — Ambulatory Visit: Payer: BLUE CROSS/BLUE SHIELD

## 2016-12-19 DIAGNOSIS — M1990 Unspecified osteoarthritis, unspecified site: Secondary | ICD-10-CM | POA: Diagnosis not present

## 2016-12-19 DIAGNOSIS — Z510 Encounter for antineoplastic radiation therapy: Secondary | ICD-10-CM | POA: Diagnosis not present

## 2016-12-19 DIAGNOSIS — Z803 Family history of malignant neoplasm of breast: Secondary | ICD-10-CM | POA: Diagnosis not present

## 2016-12-19 DIAGNOSIS — E669 Obesity, unspecified: Secondary | ICD-10-CM | POA: Diagnosis not present

## 2016-12-19 DIAGNOSIS — Z6832 Body mass index (BMI) 32.0-32.9, adult: Secondary | ICD-10-CM | POA: Diagnosis not present

## 2016-12-19 DIAGNOSIS — G629 Polyneuropathy, unspecified: Secondary | ICD-10-CM | POA: Diagnosis not present

## 2016-12-19 DIAGNOSIS — Z170 Estrogen receptor positive status [ER+]: Secondary | ICD-10-CM | POA: Diagnosis not present

## 2016-12-19 DIAGNOSIS — R7303 Prediabetes: Secondary | ICD-10-CM | POA: Diagnosis not present

## 2016-12-19 DIAGNOSIS — F419 Anxiety disorder, unspecified: Secondary | ICD-10-CM | POA: Diagnosis not present

## 2016-12-19 DIAGNOSIS — C50412 Malignant neoplasm of upper-outer quadrant of left female breast: Secondary | ICD-10-CM | POA: Diagnosis not present

## 2016-12-19 DIAGNOSIS — Z888 Allergy status to other drugs, medicaments and biological substances status: Secondary | ICD-10-CM | POA: Diagnosis not present

## 2016-12-19 DIAGNOSIS — Z79899 Other long term (current) drug therapy: Secondary | ICD-10-CM | POA: Diagnosis not present

## 2016-12-19 DIAGNOSIS — C773 Secondary and unspecified malignant neoplasm of axilla and upper limb lymph nodes: Secondary | ICD-10-CM | POA: Diagnosis not present

## 2016-12-19 DIAGNOSIS — R110 Nausea: Secondary | ICD-10-CM | POA: Diagnosis not present

## 2016-12-19 DIAGNOSIS — E785 Hyperlipidemia, unspecified: Secondary | ICD-10-CM | POA: Diagnosis not present

## 2016-12-19 DIAGNOSIS — I10 Essential (primary) hypertension: Secondary | ICD-10-CM | POA: Diagnosis not present

## 2016-12-20 ENCOUNTER — Ambulatory Visit
Admission: RE | Admit: 2016-12-20 | Discharge: 2016-12-20 | Disposition: A | Discharge status: Home / Self Care | Payer: BLUE CROSS/BLUE SHIELD | Source: Ambulatory Visit | Attending: Radiation Oncology | Admitting: Radiation Oncology

## 2016-12-20 ENCOUNTER — Ambulatory Visit: Payer: BLUE CROSS/BLUE SHIELD

## 2016-12-20 DIAGNOSIS — R7303 Prediabetes: Secondary | ICD-10-CM | POA: Diagnosis not present

## 2016-12-20 DIAGNOSIS — Z510 Encounter for antineoplastic radiation therapy: Secondary | ICD-10-CM | POA: Diagnosis not present

## 2016-12-20 DIAGNOSIS — M1990 Unspecified osteoarthritis, unspecified site: Secondary | ICD-10-CM | POA: Diagnosis not present

## 2016-12-20 DIAGNOSIS — Z888 Allergy status to other drugs, medicaments and biological substances status: Secondary | ICD-10-CM | POA: Diagnosis not present

## 2016-12-20 DIAGNOSIS — Z803 Family history of malignant neoplasm of breast: Secondary | ICD-10-CM | POA: Diagnosis not present

## 2016-12-20 DIAGNOSIS — E669 Obesity, unspecified: Secondary | ICD-10-CM | POA: Diagnosis not present

## 2016-12-20 DIAGNOSIS — C773 Secondary and unspecified malignant neoplasm of axilla and upper limb lymph nodes: Secondary | ICD-10-CM | POA: Diagnosis not present

## 2016-12-20 DIAGNOSIS — R110 Nausea: Secondary | ICD-10-CM | POA: Diagnosis not present

## 2016-12-20 DIAGNOSIS — Z79899 Other long term (current) drug therapy: Secondary | ICD-10-CM | POA: Diagnosis not present

## 2016-12-20 DIAGNOSIS — C50412 Malignant neoplasm of upper-outer quadrant of left female breast: Secondary | ICD-10-CM | POA: Diagnosis not present

## 2016-12-20 DIAGNOSIS — Z170 Estrogen receptor positive status [ER+]: Principal | ICD-10-CM

## 2016-12-20 DIAGNOSIS — I10 Essential (primary) hypertension: Secondary | ICD-10-CM | POA: Diagnosis not present

## 2016-12-20 DIAGNOSIS — G629 Polyneuropathy, unspecified: Secondary | ICD-10-CM | POA: Diagnosis not present

## 2016-12-20 DIAGNOSIS — Z6832 Body mass index (BMI) 32.0-32.9, adult: Secondary | ICD-10-CM | POA: Diagnosis not present

## 2016-12-20 DIAGNOSIS — E785 Hyperlipidemia, unspecified: Secondary | ICD-10-CM | POA: Diagnosis not present

## 2016-12-20 DIAGNOSIS — F419 Anxiety disorder, unspecified: Secondary | ICD-10-CM | POA: Diagnosis not present

## 2016-12-20 MED ORDER — RADIAPLEXRX EX GEL
Freq: Once | CUTANEOUS | Status: AC
  Administered 2016-12-20: 14:00:00 via TOPICAL

## 2016-12-20 MED ORDER — ALRA NON-METALLIC DEODORANT (RAD-ONC)
1.0000 "application " | Freq: Once | TOPICAL | Status: AC
  Administered 2016-12-20: 1 via TOPICAL

## 2016-12-20 NOTE — Progress Notes (Signed)
Pt education done, my business card, radiation therapy and you book, alra deodorant and radiaplex gel cream given, discussed skin irritation, pain, fatigue, swelling in breat,  Tanning, dryness, radiaplex to moisturize and sooth , no shaving unless electric, no under wire bras, increase protein in diet, and stay hydrated, drink plenty water, luke warm showers, unscented mild soap, no rubbing,scrubbing or scratching breast area, pat dry, get plenty rest and sleep, do some form of exercise daily, sees MD weekly and prn, teach back given 2:23 PM

## 2016-12-21 ENCOUNTER — Ambulatory Visit
Admission: RE | Admit: 2016-12-21 | Discharge: 2016-12-21 | Disposition: A | Discharge status: Home / Self Care | Payer: BLUE CROSS/BLUE SHIELD | Source: Ambulatory Visit | Attending: Radiation Oncology | Admitting: Radiation Oncology

## 2016-12-21 ENCOUNTER — Ambulatory Visit: Payer: BLUE CROSS/BLUE SHIELD

## 2016-12-21 DIAGNOSIS — R110 Nausea: Secondary | ICD-10-CM | POA: Diagnosis not present

## 2016-12-21 DIAGNOSIS — F419 Anxiety disorder, unspecified: Secondary | ICD-10-CM | POA: Diagnosis not present

## 2016-12-21 DIAGNOSIS — E785 Hyperlipidemia, unspecified: Secondary | ICD-10-CM | POA: Diagnosis not present

## 2016-12-21 DIAGNOSIS — Z170 Estrogen receptor positive status [ER+]: Secondary | ICD-10-CM | POA: Diagnosis not present

## 2016-12-21 DIAGNOSIS — C773 Secondary and unspecified malignant neoplasm of axilla and upper limb lymph nodes: Secondary | ICD-10-CM | POA: Diagnosis not present

## 2016-12-21 DIAGNOSIS — R7303 Prediabetes: Secondary | ICD-10-CM | POA: Diagnosis not present

## 2016-12-21 DIAGNOSIS — Z6832 Body mass index (BMI) 32.0-32.9, adult: Secondary | ICD-10-CM | POA: Diagnosis not present

## 2016-12-21 DIAGNOSIS — E669 Obesity, unspecified: Secondary | ICD-10-CM | POA: Diagnosis not present

## 2016-12-21 DIAGNOSIS — Z79899 Other long term (current) drug therapy: Secondary | ICD-10-CM | POA: Diagnosis not present

## 2016-12-21 DIAGNOSIS — Z510 Encounter for antineoplastic radiation therapy: Secondary | ICD-10-CM | POA: Diagnosis not present

## 2016-12-21 DIAGNOSIS — Z803 Family history of malignant neoplasm of breast: Secondary | ICD-10-CM | POA: Diagnosis not present

## 2016-12-21 DIAGNOSIS — I10 Essential (primary) hypertension: Secondary | ICD-10-CM | POA: Diagnosis not present

## 2016-12-21 DIAGNOSIS — C50412 Malignant neoplasm of upper-outer quadrant of left female breast: Secondary | ICD-10-CM | POA: Diagnosis not present

## 2016-12-21 DIAGNOSIS — M1990 Unspecified osteoarthritis, unspecified site: Secondary | ICD-10-CM | POA: Diagnosis not present

## 2016-12-21 DIAGNOSIS — Z888 Allergy status to other drugs, medicaments and biological substances status: Secondary | ICD-10-CM | POA: Diagnosis not present

## 2016-12-21 DIAGNOSIS — G629 Polyneuropathy, unspecified: Secondary | ICD-10-CM | POA: Diagnosis not present

## 2016-12-22 ENCOUNTER — Ambulatory Visit: Payer: BLUE CROSS/BLUE SHIELD

## 2016-12-22 ENCOUNTER — Ambulatory Visit
Admission: RE | Admit: 2016-12-22 | Discharge: 2016-12-22 | Disposition: A | Discharge status: Home / Self Care | Payer: BLUE CROSS/BLUE SHIELD | Source: Ambulatory Visit | Attending: Radiation Oncology | Admitting: Radiation Oncology

## 2016-12-22 DIAGNOSIS — Z888 Allergy status to other drugs, medicaments and biological substances status: Secondary | ICD-10-CM | POA: Diagnosis not present

## 2016-12-22 DIAGNOSIS — E785 Hyperlipidemia, unspecified: Secondary | ICD-10-CM | POA: Diagnosis not present

## 2016-12-22 DIAGNOSIS — M1990 Unspecified osteoarthritis, unspecified site: Secondary | ICD-10-CM | POA: Diagnosis not present

## 2016-12-22 DIAGNOSIS — E669 Obesity, unspecified: Secondary | ICD-10-CM | POA: Diagnosis not present

## 2016-12-22 DIAGNOSIS — Z803 Family history of malignant neoplasm of breast: Secondary | ICD-10-CM | POA: Diagnosis not present

## 2016-12-22 DIAGNOSIS — Z170 Estrogen receptor positive status [ER+]: Secondary | ICD-10-CM | POA: Diagnosis not present

## 2016-12-22 DIAGNOSIS — C50412 Malignant neoplasm of upper-outer quadrant of left female breast: Secondary | ICD-10-CM | POA: Diagnosis not present

## 2016-12-22 DIAGNOSIS — C773 Secondary and unspecified malignant neoplasm of axilla and upper limb lymph nodes: Secondary | ICD-10-CM | POA: Diagnosis not present

## 2016-12-22 DIAGNOSIS — R7303 Prediabetes: Secondary | ICD-10-CM | POA: Diagnosis not present

## 2016-12-22 DIAGNOSIS — Z6832 Body mass index (BMI) 32.0-32.9, adult: Secondary | ICD-10-CM | POA: Diagnosis not present

## 2016-12-22 DIAGNOSIS — Z79899 Other long term (current) drug therapy: Secondary | ICD-10-CM | POA: Diagnosis not present

## 2016-12-22 DIAGNOSIS — F419 Anxiety disorder, unspecified: Secondary | ICD-10-CM | POA: Diagnosis not present

## 2016-12-22 DIAGNOSIS — I10 Essential (primary) hypertension: Secondary | ICD-10-CM | POA: Diagnosis not present

## 2016-12-22 DIAGNOSIS — G629 Polyneuropathy, unspecified: Secondary | ICD-10-CM | POA: Diagnosis not present

## 2016-12-22 DIAGNOSIS — R110 Nausea: Secondary | ICD-10-CM | POA: Diagnosis not present

## 2016-12-22 DIAGNOSIS — Z510 Encounter for antineoplastic radiation therapy: Secondary | ICD-10-CM | POA: Diagnosis not present

## 2016-12-23 ENCOUNTER — Ambulatory Visit: Payer: BLUE CROSS/BLUE SHIELD

## 2016-12-23 ENCOUNTER — Ambulatory Visit
Admission: RE | Admit: 2016-12-23 | Discharge: 2016-12-23 | Disposition: A | Discharge status: Home / Self Care | Payer: BLUE CROSS/BLUE SHIELD | Source: Ambulatory Visit | Attending: Radiation Oncology | Admitting: Radiation Oncology

## 2016-12-23 DIAGNOSIS — C50412 Malignant neoplasm of upper-outer quadrant of left female breast: Secondary | ICD-10-CM | POA: Diagnosis not present

## 2016-12-23 DIAGNOSIS — Z803 Family history of malignant neoplasm of breast: Secondary | ICD-10-CM | POA: Diagnosis not present

## 2016-12-23 DIAGNOSIS — Z170 Estrogen receptor positive status [ER+]: Secondary | ICD-10-CM | POA: Diagnosis not present

## 2016-12-23 DIAGNOSIS — R110 Nausea: Secondary | ICD-10-CM | POA: Diagnosis not present

## 2016-12-23 DIAGNOSIS — Z510 Encounter for antineoplastic radiation therapy: Secondary | ICD-10-CM | POA: Diagnosis not present

## 2016-12-23 DIAGNOSIS — E669 Obesity, unspecified: Secondary | ICD-10-CM | POA: Diagnosis not present

## 2016-12-23 DIAGNOSIS — Z888 Allergy status to other drugs, medicaments and biological substances status: Secondary | ICD-10-CM | POA: Diagnosis not present

## 2016-12-23 DIAGNOSIS — F419 Anxiety disorder, unspecified: Secondary | ICD-10-CM | POA: Diagnosis not present

## 2016-12-23 DIAGNOSIS — M1990 Unspecified osteoarthritis, unspecified site: Secondary | ICD-10-CM | POA: Diagnosis not present

## 2016-12-23 DIAGNOSIS — I10 Essential (primary) hypertension: Secondary | ICD-10-CM | POA: Diagnosis not present

## 2016-12-23 DIAGNOSIS — Z6832 Body mass index (BMI) 32.0-32.9, adult: Secondary | ICD-10-CM | POA: Diagnosis not present

## 2016-12-23 DIAGNOSIS — C773 Secondary and unspecified malignant neoplasm of axilla and upper limb lymph nodes: Secondary | ICD-10-CM | POA: Diagnosis not present

## 2016-12-23 DIAGNOSIS — R7303 Prediabetes: Secondary | ICD-10-CM | POA: Diagnosis not present

## 2016-12-23 DIAGNOSIS — E785 Hyperlipidemia, unspecified: Secondary | ICD-10-CM | POA: Diagnosis not present

## 2016-12-23 DIAGNOSIS — G629 Polyneuropathy, unspecified: Secondary | ICD-10-CM | POA: Diagnosis not present

## 2016-12-23 DIAGNOSIS — Z79899 Other long term (current) drug therapy: Secondary | ICD-10-CM | POA: Diagnosis not present

## 2016-12-26 ENCOUNTER — Ambulatory Visit: Payer: BLUE CROSS/BLUE SHIELD

## 2016-12-26 ENCOUNTER — Ambulatory Visit
Admission: RE | Admit: 2016-12-26 | Discharge: 2016-12-26 | Disposition: A | Discharge status: Home / Self Care | Payer: BLUE CROSS/BLUE SHIELD | Source: Ambulatory Visit | Attending: Radiation Oncology | Admitting: Radiation Oncology

## 2016-12-26 DIAGNOSIS — F419 Anxiety disorder, unspecified: Secondary | ICD-10-CM | POA: Diagnosis not present

## 2016-12-26 DIAGNOSIS — Z170 Estrogen receptor positive status [ER+]: Secondary | ICD-10-CM | POA: Diagnosis not present

## 2016-12-26 DIAGNOSIS — M1990 Unspecified osteoarthritis, unspecified site: Secondary | ICD-10-CM | POA: Diagnosis not present

## 2016-12-26 DIAGNOSIS — Z510 Encounter for antineoplastic radiation therapy: Secondary | ICD-10-CM | POA: Diagnosis not present

## 2016-12-26 DIAGNOSIS — Z79899 Other long term (current) drug therapy: Secondary | ICD-10-CM | POA: Diagnosis not present

## 2016-12-26 DIAGNOSIS — C773 Secondary and unspecified malignant neoplasm of axilla and upper limb lymph nodes: Secondary | ICD-10-CM | POA: Diagnosis not present

## 2016-12-26 DIAGNOSIS — R110 Nausea: Secondary | ICD-10-CM | POA: Diagnosis not present

## 2016-12-26 DIAGNOSIS — E785 Hyperlipidemia, unspecified: Secondary | ICD-10-CM | POA: Diagnosis not present

## 2016-12-26 DIAGNOSIS — C50412 Malignant neoplasm of upper-outer quadrant of left female breast: Secondary | ICD-10-CM | POA: Diagnosis not present

## 2016-12-26 DIAGNOSIS — Z6832 Body mass index (BMI) 32.0-32.9, adult: Secondary | ICD-10-CM | POA: Diagnosis not present

## 2016-12-26 DIAGNOSIS — Z803 Family history of malignant neoplasm of breast: Secondary | ICD-10-CM | POA: Diagnosis not present

## 2016-12-26 DIAGNOSIS — G629 Polyneuropathy, unspecified: Secondary | ICD-10-CM | POA: Diagnosis not present

## 2016-12-26 DIAGNOSIS — I10 Essential (primary) hypertension: Secondary | ICD-10-CM | POA: Diagnosis not present

## 2016-12-26 DIAGNOSIS — R7303 Prediabetes: Secondary | ICD-10-CM | POA: Diagnosis not present

## 2016-12-26 DIAGNOSIS — E669 Obesity, unspecified: Secondary | ICD-10-CM | POA: Diagnosis not present

## 2016-12-26 DIAGNOSIS — Z888 Allergy status to other drugs, medicaments and biological substances status: Secondary | ICD-10-CM | POA: Diagnosis not present

## 2016-12-27 ENCOUNTER — Ambulatory Visit
Admission: RE | Admit: 2016-12-27 | Discharge: 2016-12-27 | Disposition: A | Discharge status: Home / Self Care | Payer: BLUE CROSS/BLUE SHIELD | Source: Ambulatory Visit | Attending: Radiation Oncology | Admitting: Radiation Oncology

## 2016-12-27 ENCOUNTER — Ambulatory Visit: Payer: BLUE CROSS/BLUE SHIELD

## 2016-12-27 DIAGNOSIS — Z79899 Other long term (current) drug therapy: Secondary | ICD-10-CM | POA: Diagnosis not present

## 2016-12-27 DIAGNOSIS — Z510 Encounter for antineoplastic radiation therapy: Secondary | ICD-10-CM | POA: Diagnosis not present

## 2016-12-27 DIAGNOSIS — I10 Essential (primary) hypertension: Secondary | ICD-10-CM | POA: Diagnosis not present

## 2016-12-27 DIAGNOSIS — C50412 Malignant neoplasm of upper-outer quadrant of left female breast: Secondary | ICD-10-CM | POA: Diagnosis not present

## 2016-12-27 DIAGNOSIS — R7303 Prediabetes: Secondary | ICD-10-CM | POA: Diagnosis not present

## 2016-12-27 DIAGNOSIS — Z6832 Body mass index (BMI) 32.0-32.9, adult: Secondary | ICD-10-CM | POA: Diagnosis not present

## 2016-12-27 DIAGNOSIS — F419 Anxiety disorder, unspecified: Secondary | ICD-10-CM | POA: Diagnosis not present

## 2016-12-27 DIAGNOSIS — C773 Secondary and unspecified malignant neoplasm of axilla and upper limb lymph nodes: Secondary | ICD-10-CM | POA: Diagnosis not present

## 2016-12-27 DIAGNOSIS — Z170 Estrogen receptor positive status [ER+]: Secondary | ICD-10-CM | POA: Diagnosis not present

## 2016-12-27 DIAGNOSIS — Z853 Personal history of malignant neoplasm of breast: Secondary | ICD-10-CM | POA: Diagnosis not present

## 2016-12-27 DIAGNOSIS — R110 Nausea: Secondary | ICD-10-CM | POA: Diagnosis not present

## 2016-12-27 DIAGNOSIS — E669 Obesity, unspecified: Secondary | ICD-10-CM | POA: Diagnosis not present

## 2016-12-27 DIAGNOSIS — Z803 Family history of malignant neoplasm of breast: Secondary | ICD-10-CM | POA: Diagnosis not present

## 2016-12-27 DIAGNOSIS — E785 Hyperlipidemia, unspecified: Secondary | ICD-10-CM | POA: Diagnosis not present

## 2016-12-27 DIAGNOSIS — Z888 Allergy status to other drugs, medicaments and biological substances status: Secondary | ICD-10-CM | POA: Diagnosis not present

## 2016-12-27 DIAGNOSIS — G629 Polyneuropathy, unspecified: Secondary | ICD-10-CM | POA: Diagnosis not present

## 2016-12-27 DIAGNOSIS — M1990 Unspecified osteoarthritis, unspecified site: Secondary | ICD-10-CM | POA: Diagnosis not present

## 2016-12-28 ENCOUNTER — Ambulatory Visit
Admission: RE | Admit: 2016-12-28 | Discharge: 2016-12-28 | Disposition: A | Discharge status: Home / Self Care | Payer: BLUE CROSS/BLUE SHIELD | Source: Ambulatory Visit | Attending: Radiation Oncology | Admitting: Radiation Oncology

## 2016-12-28 ENCOUNTER — Ambulatory Visit: Payer: BLUE CROSS/BLUE SHIELD

## 2016-12-28 DIAGNOSIS — E785 Hyperlipidemia, unspecified: Secondary | ICD-10-CM | POA: Diagnosis not present

## 2016-12-28 DIAGNOSIS — Z888 Allergy status to other drugs, medicaments and biological substances status: Secondary | ICD-10-CM | POA: Diagnosis not present

## 2016-12-28 DIAGNOSIS — Z79899 Other long term (current) drug therapy: Secondary | ICD-10-CM | POA: Diagnosis not present

## 2016-12-28 DIAGNOSIS — R110 Nausea: Secondary | ICD-10-CM | POA: Diagnosis not present

## 2016-12-28 DIAGNOSIS — Z170 Estrogen receptor positive status [ER+]: Secondary | ICD-10-CM | POA: Diagnosis not present

## 2016-12-28 DIAGNOSIS — Z6832 Body mass index (BMI) 32.0-32.9, adult: Secondary | ICD-10-CM | POA: Diagnosis not present

## 2016-12-28 DIAGNOSIS — F419 Anxiety disorder, unspecified: Secondary | ICD-10-CM | POA: Diagnosis not present

## 2016-12-28 DIAGNOSIS — R7303 Prediabetes: Secondary | ICD-10-CM | POA: Diagnosis not present

## 2016-12-28 DIAGNOSIS — C50412 Malignant neoplasm of upper-outer quadrant of left female breast: Secondary | ICD-10-CM | POA: Diagnosis not present

## 2016-12-28 DIAGNOSIS — M1990 Unspecified osteoarthritis, unspecified site: Secondary | ICD-10-CM | POA: Diagnosis not present

## 2016-12-28 DIAGNOSIS — Z510 Encounter for antineoplastic radiation therapy: Secondary | ICD-10-CM | POA: Diagnosis not present

## 2016-12-28 DIAGNOSIS — G629 Polyneuropathy, unspecified: Secondary | ICD-10-CM | POA: Diagnosis not present

## 2016-12-28 DIAGNOSIS — E669 Obesity, unspecified: Secondary | ICD-10-CM | POA: Diagnosis not present

## 2016-12-28 DIAGNOSIS — I10 Essential (primary) hypertension: Secondary | ICD-10-CM | POA: Diagnosis not present

## 2016-12-28 DIAGNOSIS — Z803 Family history of malignant neoplasm of breast: Secondary | ICD-10-CM | POA: Diagnosis not present

## 2016-12-28 DIAGNOSIS — C773 Secondary and unspecified malignant neoplasm of axilla and upper limb lymph nodes: Secondary | ICD-10-CM | POA: Diagnosis not present

## 2016-12-29 ENCOUNTER — Ambulatory Visit
Admission: RE | Admit: 2016-12-29 | Discharge: 2016-12-29 | Disposition: A | Discharge status: Home / Self Care | Payer: BLUE CROSS/BLUE SHIELD | Source: Ambulatory Visit | Attending: Radiation Oncology | Admitting: Radiation Oncology

## 2016-12-29 ENCOUNTER — Ambulatory Visit: Payer: BLUE CROSS/BLUE SHIELD

## 2016-12-29 DIAGNOSIS — R7303 Prediabetes: Secondary | ICD-10-CM | POA: Diagnosis not present

## 2016-12-29 DIAGNOSIS — Z79899 Other long term (current) drug therapy: Secondary | ICD-10-CM | POA: Diagnosis not present

## 2016-12-29 DIAGNOSIS — C50412 Malignant neoplasm of upper-outer quadrant of left female breast: Secondary | ICD-10-CM | POA: Diagnosis not present

## 2016-12-29 DIAGNOSIS — I10 Essential (primary) hypertension: Secondary | ICD-10-CM | POA: Diagnosis not present

## 2016-12-29 DIAGNOSIS — E669 Obesity, unspecified: Secondary | ICD-10-CM | POA: Diagnosis not present

## 2016-12-29 DIAGNOSIS — Z170 Estrogen receptor positive status [ER+]: Secondary | ICD-10-CM | POA: Diagnosis not present

## 2016-12-29 DIAGNOSIS — Z510 Encounter for antineoplastic radiation therapy: Secondary | ICD-10-CM | POA: Diagnosis not present

## 2016-12-29 DIAGNOSIS — C773 Secondary and unspecified malignant neoplasm of axilla and upper limb lymph nodes: Secondary | ICD-10-CM | POA: Diagnosis not present

## 2016-12-29 DIAGNOSIS — E785 Hyperlipidemia, unspecified: Secondary | ICD-10-CM | POA: Diagnosis not present

## 2016-12-29 DIAGNOSIS — Z6832 Body mass index (BMI) 32.0-32.9, adult: Secondary | ICD-10-CM | POA: Diagnosis not present

## 2016-12-29 DIAGNOSIS — R110 Nausea: Secondary | ICD-10-CM | POA: Diagnosis not present

## 2016-12-29 DIAGNOSIS — F419 Anxiety disorder, unspecified: Secondary | ICD-10-CM | POA: Diagnosis not present

## 2016-12-29 DIAGNOSIS — G629 Polyneuropathy, unspecified: Secondary | ICD-10-CM | POA: Diagnosis not present

## 2016-12-29 DIAGNOSIS — M1990 Unspecified osteoarthritis, unspecified site: Secondary | ICD-10-CM | POA: Diagnosis not present

## 2016-12-29 DIAGNOSIS — Z803 Family history of malignant neoplasm of breast: Secondary | ICD-10-CM | POA: Diagnosis not present

## 2016-12-29 DIAGNOSIS — Z888 Allergy status to other drugs, medicaments and biological substances status: Secondary | ICD-10-CM | POA: Diagnosis not present

## 2016-12-30 ENCOUNTER — Ambulatory Visit
Admission: RE | Admit: 2016-12-30 | Discharge: 2016-12-30 | Disposition: A | Discharge status: Home / Self Care | Payer: BLUE CROSS/BLUE SHIELD | Source: Ambulatory Visit | Attending: Radiation Oncology | Admitting: Radiation Oncology

## 2016-12-30 ENCOUNTER — Ambulatory Visit: Payer: BLUE CROSS/BLUE SHIELD

## 2016-12-30 DIAGNOSIS — Z6832 Body mass index (BMI) 32.0-32.9, adult: Secondary | ICD-10-CM | POA: Diagnosis not present

## 2016-12-30 DIAGNOSIS — R7303 Prediabetes: Secondary | ICD-10-CM | POA: Diagnosis not present

## 2016-12-30 DIAGNOSIS — C50412 Malignant neoplasm of upper-outer quadrant of left female breast: Secondary | ICD-10-CM | POA: Diagnosis not present

## 2016-12-30 DIAGNOSIS — Z803 Family history of malignant neoplasm of breast: Secondary | ICD-10-CM | POA: Diagnosis not present

## 2016-12-30 DIAGNOSIS — C773 Secondary and unspecified malignant neoplasm of axilla and upper limb lymph nodes: Secondary | ICD-10-CM | POA: Diagnosis not present

## 2016-12-30 DIAGNOSIS — F419 Anxiety disorder, unspecified: Secondary | ICD-10-CM | POA: Diagnosis not present

## 2016-12-30 DIAGNOSIS — M1990 Unspecified osteoarthritis, unspecified site: Secondary | ICD-10-CM | POA: Diagnosis not present

## 2016-12-30 DIAGNOSIS — Z510 Encounter for antineoplastic radiation therapy: Secondary | ICD-10-CM | POA: Diagnosis not present

## 2016-12-30 DIAGNOSIS — Z170 Estrogen receptor positive status [ER+]: Secondary | ICD-10-CM | POA: Diagnosis not present

## 2016-12-30 DIAGNOSIS — Z888 Allergy status to other drugs, medicaments and biological substances status: Secondary | ICD-10-CM | POA: Diagnosis not present

## 2016-12-30 DIAGNOSIS — Z79899 Other long term (current) drug therapy: Secondary | ICD-10-CM | POA: Diagnosis not present

## 2016-12-30 DIAGNOSIS — R110 Nausea: Secondary | ICD-10-CM | POA: Diagnosis not present

## 2016-12-30 DIAGNOSIS — E669 Obesity, unspecified: Secondary | ICD-10-CM | POA: Diagnosis not present

## 2016-12-30 DIAGNOSIS — I10 Essential (primary) hypertension: Secondary | ICD-10-CM | POA: Diagnosis not present

## 2016-12-30 DIAGNOSIS — G629 Polyneuropathy, unspecified: Secondary | ICD-10-CM | POA: Diagnosis not present

## 2016-12-30 DIAGNOSIS — E785 Hyperlipidemia, unspecified: Secondary | ICD-10-CM | POA: Diagnosis not present

## 2017-01-02 ENCOUNTER — Ambulatory Visit
Admission: RE | Admit: 2017-01-02 | Discharge: 2017-01-02 | Disposition: A | Discharge status: Home / Self Care | Payer: BLUE CROSS/BLUE SHIELD | Source: Ambulatory Visit | Attending: Radiation Oncology | Admitting: Radiation Oncology

## 2017-01-02 ENCOUNTER — Ambulatory Visit: Payer: BLUE CROSS/BLUE SHIELD

## 2017-01-02 DIAGNOSIS — Z6832 Body mass index (BMI) 32.0-32.9, adult: Secondary | ICD-10-CM | POA: Diagnosis not present

## 2017-01-02 DIAGNOSIS — Z803 Family history of malignant neoplasm of breast: Secondary | ICD-10-CM | POA: Diagnosis not present

## 2017-01-02 DIAGNOSIS — R7303 Prediabetes: Secondary | ICD-10-CM | POA: Diagnosis not present

## 2017-01-02 DIAGNOSIS — F419 Anxiety disorder, unspecified: Secondary | ICD-10-CM | POA: Diagnosis not present

## 2017-01-02 DIAGNOSIS — R110 Nausea: Secondary | ICD-10-CM | POA: Diagnosis not present

## 2017-01-02 DIAGNOSIS — C50412 Malignant neoplasm of upper-outer quadrant of left female breast: Secondary | ICD-10-CM | POA: Diagnosis not present

## 2017-01-02 DIAGNOSIS — Z79899 Other long term (current) drug therapy: Secondary | ICD-10-CM | POA: Diagnosis not present

## 2017-01-02 DIAGNOSIS — M1990 Unspecified osteoarthritis, unspecified site: Secondary | ICD-10-CM | POA: Diagnosis not present

## 2017-01-02 DIAGNOSIS — C773 Secondary and unspecified malignant neoplasm of axilla and upper limb lymph nodes: Secondary | ICD-10-CM | POA: Diagnosis not present

## 2017-01-02 DIAGNOSIS — E785 Hyperlipidemia, unspecified: Secondary | ICD-10-CM | POA: Diagnosis not present

## 2017-01-02 DIAGNOSIS — E669 Obesity, unspecified: Secondary | ICD-10-CM | POA: Diagnosis not present

## 2017-01-02 DIAGNOSIS — I10 Essential (primary) hypertension: Secondary | ICD-10-CM | POA: Diagnosis not present

## 2017-01-02 DIAGNOSIS — Z510 Encounter for antineoplastic radiation therapy: Secondary | ICD-10-CM | POA: Diagnosis not present

## 2017-01-02 DIAGNOSIS — G629 Polyneuropathy, unspecified: Secondary | ICD-10-CM | POA: Diagnosis not present

## 2017-01-02 DIAGNOSIS — Z888 Allergy status to other drugs, medicaments and biological substances status: Secondary | ICD-10-CM | POA: Diagnosis not present

## 2017-01-02 DIAGNOSIS — Z170 Estrogen receptor positive status [ER+]: Secondary | ICD-10-CM | POA: Diagnosis not present

## 2017-01-03 ENCOUNTER — Ambulatory Visit
Admission: RE | Admit: 2017-01-03 | Discharge: 2017-01-03 | Disposition: A | Discharge status: Home / Self Care | Payer: BLUE CROSS/BLUE SHIELD | Source: Ambulatory Visit | Attending: Radiation Oncology | Admitting: Radiation Oncology

## 2017-01-03 ENCOUNTER — Ambulatory Visit: Payer: BLUE CROSS/BLUE SHIELD

## 2017-01-03 DIAGNOSIS — E669 Obesity, unspecified: Secondary | ICD-10-CM | POA: Diagnosis not present

## 2017-01-03 DIAGNOSIS — Z170 Estrogen receptor positive status [ER+]: Secondary | ICD-10-CM | POA: Diagnosis not present

## 2017-01-03 DIAGNOSIS — C50412 Malignant neoplasm of upper-outer quadrant of left female breast: Secondary | ICD-10-CM | POA: Diagnosis not present

## 2017-01-03 DIAGNOSIS — M1990 Unspecified osteoarthritis, unspecified site: Secondary | ICD-10-CM | POA: Diagnosis not present

## 2017-01-03 DIAGNOSIS — R7303 Prediabetes: Secondary | ICD-10-CM | POA: Diagnosis not present

## 2017-01-03 DIAGNOSIS — Z79899 Other long term (current) drug therapy: Secondary | ICD-10-CM | POA: Diagnosis not present

## 2017-01-03 DIAGNOSIS — R110 Nausea: Secondary | ICD-10-CM | POA: Diagnosis not present

## 2017-01-03 DIAGNOSIS — Z510 Encounter for antineoplastic radiation therapy: Secondary | ICD-10-CM | POA: Diagnosis not present

## 2017-01-03 DIAGNOSIS — Z888 Allergy status to other drugs, medicaments and biological substances status: Secondary | ICD-10-CM | POA: Diagnosis not present

## 2017-01-03 DIAGNOSIS — C773 Secondary and unspecified malignant neoplasm of axilla and upper limb lymph nodes: Secondary | ICD-10-CM | POA: Diagnosis not present

## 2017-01-03 DIAGNOSIS — Z6832 Body mass index (BMI) 32.0-32.9, adult: Secondary | ICD-10-CM | POA: Diagnosis not present

## 2017-01-03 DIAGNOSIS — E785 Hyperlipidemia, unspecified: Secondary | ICD-10-CM | POA: Diagnosis not present

## 2017-01-03 DIAGNOSIS — I10 Essential (primary) hypertension: Secondary | ICD-10-CM | POA: Diagnosis not present

## 2017-01-03 DIAGNOSIS — Z803 Family history of malignant neoplasm of breast: Secondary | ICD-10-CM | POA: Diagnosis not present

## 2017-01-03 DIAGNOSIS — F419 Anxiety disorder, unspecified: Secondary | ICD-10-CM | POA: Diagnosis not present

## 2017-01-03 DIAGNOSIS — G629 Polyneuropathy, unspecified: Secondary | ICD-10-CM | POA: Diagnosis not present

## 2017-01-04 ENCOUNTER — Ambulatory Visit
Admission: RE | Admit: 2017-01-04 | Discharge: 2017-01-04 | Disposition: A | Discharge status: Home / Self Care | Payer: BLUE CROSS/BLUE SHIELD | Source: Ambulatory Visit | Attending: Radiation Oncology | Admitting: Radiation Oncology

## 2017-01-04 ENCOUNTER — Ambulatory Visit: Payer: BLUE CROSS/BLUE SHIELD

## 2017-01-04 DIAGNOSIS — Z510 Encounter for antineoplastic radiation therapy: Secondary | ICD-10-CM | POA: Diagnosis not present

## 2017-01-04 DIAGNOSIS — Z888 Allergy status to other drugs, medicaments and biological substances status: Secondary | ICD-10-CM | POA: Diagnosis not present

## 2017-01-04 DIAGNOSIS — I10 Essential (primary) hypertension: Secondary | ICD-10-CM | POA: Diagnosis not present

## 2017-01-04 DIAGNOSIS — Z803 Family history of malignant neoplasm of breast: Secondary | ICD-10-CM | POA: Diagnosis not present

## 2017-01-04 DIAGNOSIS — E669 Obesity, unspecified: Secondary | ICD-10-CM | POA: Diagnosis not present

## 2017-01-04 DIAGNOSIS — R7303 Prediabetes: Secondary | ICD-10-CM | POA: Diagnosis not present

## 2017-01-04 DIAGNOSIS — R110 Nausea: Secondary | ICD-10-CM | POA: Diagnosis not present

## 2017-01-04 DIAGNOSIS — C50412 Malignant neoplasm of upper-outer quadrant of left female breast: Secondary | ICD-10-CM | POA: Diagnosis not present

## 2017-01-04 DIAGNOSIS — Z170 Estrogen receptor positive status [ER+]: Secondary | ICD-10-CM | POA: Diagnosis not present

## 2017-01-04 DIAGNOSIS — F419 Anxiety disorder, unspecified: Secondary | ICD-10-CM | POA: Diagnosis not present

## 2017-01-04 DIAGNOSIS — C773 Secondary and unspecified malignant neoplasm of axilla and upper limb lymph nodes: Secondary | ICD-10-CM | POA: Diagnosis not present

## 2017-01-04 DIAGNOSIS — M1990 Unspecified osteoarthritis, unspecified site: Secondary | ICD-10-CM | POA: Diagnosis not present

## 2017-01-04 DIAGNOSIS — Z79899 Other long term (current) drug therapy: Secondary | ICD-10-CM | POA: Diagnosis not present

## 2017-01-04 DIAGNOSIS — Z6832 Body mass index (BMI) 32.0-32.9, adult: Secondary | ICD-10-CM | POA: Diagnosis not present

## 2017-01-04 DIAGNOSIS — G629 Polyneuropathy, unspecified: Secondary | ICD-10-CM | POA: Diagnosis not present

## 2017-01-04 DIAGNOSIS — E785 Hyperlipidemia, unspecified: Secondary | ICD-10-CM | POA: Diagnosis not present

## 2017-01-05 ENCOUNTER — Ambulatory Visit: Payer: BLUE CROSS/BLUE SHIELD

## 2017-01-05 ENCOUNTER — Ambulatory Visit
Admission: RE | Admit: 2017-01-05 | Discharge: 2017-01-05 | Disposition: A | Discharge status: Home / Self Care | Payer: BLUE CROSS/BLUE SHIELD | Source: Ambulatory Visit | Attending: Radiation Oncology | Admitting: Radiation Oncology

## 2017-01-05 ENCOUNTER — Encounter: Payer: Self-pay | Admitting: Family Medicine

## 2017-01-05 ENCOUNTER — Ambulatory Visit (INDEPENDENT_AMBULATORY_CARE_PROVIDER_SITE_OTHER): Payer: BLUE CROSS/BLUE SHIELD | Admitting: Family Medicine

## 2017-01-05 VITALS — BP 132/78 | HR 81 | Temp 98.6°F | Ht 63.0 in | Wt 185.4 lb

## 2017-01-05 DIAGNOSIS — R110 Nausea: Secondary | ICD-10-CM | POA: Diagnosis not present

## 2017-01-05 DIAGNOSIS — Z0100 Encounter for examination of eyes and vision without abnormal findings: Secondary | ICD-10-CM | POA: Diagnosis not present

## 2017-01-05 DIAGNOSIS — I10 Essential (primary) hypertension: Secondary | ICD-10-CM

## 2017-01-05 DIAGNOSIS — Z170 Estrogen receptor positive status [ER+]: Secondary | ICD-10-CM | POA: Diagnosis not present

## 2017-01-05 DIAGNOSIS — C50412 Malignant neoplasm of upper-outer quadrant of left female breast: Secondary | ICD-10-CM | POA: Diagnosis not present

## 2017-01-05 DIAGNOSIS — M1990 Unspecified osteoarthritis, unspecified site: Secondary | ICD-10-CM | POA: Diagnosis not present

## 2017-01-05 DIAGNOSIS — G629 Polyneuropathy, unspecified: Secondary | ICD-10-CM | POA: Diagnosis not present

## 2017-01-05 DIAGNOSIS — Z803 Family history of malignant neoplasm of breast: Secondary | ICD-10-CM | POA: Diagnosis not present

## 2017-01-05 DIAGNOSIS — Z888 Allergy status to other drugs, medicaments and biological substances status: Secondary | ICD-10-CM | POA: Diagnosis not present

## 2017-01-05 DIAGNOSIS — Z79899 Other long term (current) drug therapy: Secondary | ICD-10-CM | POA: Diagnosis not present

## 2017-01-05 DIAGNOSIS — Z6832 Body mass index (BMI) 32.0-32.9, adult: Secondary | ICD-10-CM | POA: Diagnosis not present

## 2017-01-05 DIAGNOSIS — Z510 Encounter for antineoplastic radiation therapy: Secondary | ICD-10-CM | POA: Diagnosis not present

## 2017-01-05 DIAGNOSIS — Z1211 Encounter for screening for malignant neoplasm of colon: Secondary | ICD-10-CM

## 2017-01-05 DIAGNOSIS — R7309 Other abnormal glucose: Secondary | ICD-10-CM | POA: Diagnosis not present

## 2017-01-05 DIAGNOSIS — F419 Anxiety disorder, unspecified: Secondary | ICD-10-CM | POA: Diagnosis not present

## 2017-01-05 DIAGNOSIS — E785 Hyperlipidemia, unspecified: Secondary | ICD-10-CM | POA: Diagnosis not present

## 2017-01-05 DIAGNOSIS — E669 Obesity, unspecified: Secondary | ICD-10-CM | POA: Diagnosis not present

## 2017-01-05 DIAGNOSIS — R7303 Prediabetes: Secondary | ICD-10-CM | POA: Diagnosis not present

## 2017-01-05 DIAGNOSIS — C773 Secondary and unspecified malignant neoplasm of axilla and upper limb lymph nodes: Secondary | ICD-10-CM | POA: Diagnosis not present

## 2017-01-05 LAB — POCT GLYCOSYLATED HEMOGLOBIN (HGB A1C): HEMOGLOBIN A1C: 5.7

## 2017-01-05 MED ORDER — GABAPENTIN 100 MG PO CAPS: 100.0000 mg | ORAL_CAPSULE | Freq: Three times a day (TID) | ORAL | 0 refills | Status: DC

## 2017-01-05 NOTE — Progress Notes (Signed)
    Subjective:  Tanya [name] is a 61 y.o. female who presents to the [facility] today with a chief complaint of blood pressure check.   HPI:  Diabetes mellitus, Type 2 Disease Monitoring Blood Sugar Ranges: does not check Polyuria: no Visual problems: no   Urine Microalbumin: NA  Last A1C: 5.7  Medication Compliance: does not take medication  Medication Side Effects Hypoglycemia: no   Preventitive Health Care Eye Exam: referral for eye exam Foot Exam: NA  Hypertension Blood pressure at home: occassionally are elevated at home.  Blood pressure today: 132/78 Meds: Compliant with losartan 50mg  daily Side effects: none ROS: Denies headache, dizziness, visual changes, nausea, vomiting, chest pain, abdominal pain or shortness of breath.   PMH: breast cancer, hypertension Tobacco use: non-smoker Medication: reviewed and updated ROS: see HPI   Objective:  Physical Exam: BP 132/78   Pulse 81   Temp 98.6 F (37 C) (Oral)   Ht 5\' 3"  (1.6 m)   Wt 185 lb 6.4 oz (84.1 kg)   SpO2 98%   BMI 32.84 kg/m   Gen: 60NAD, resting comfortably CV: RRR with no murmurs appreciated Pulm: NWOB, CTAB with no crackles, wheezes, or rhonchi GI: Normal bowel sounds present. Soft, Nontender, Nondistended. MSK: no edema, cyanosis, or clubbing noted Skin: warm, dry Neuro: grossly normal, moves all extremities Psych: Normal affect and thought content  Results for orders placed or performed in visit on 01/05/17 (from the past 72 hour(s))  HgB A1c     Status: None   Collection Time: 01/05/17  3:15 PM  Result Value Ref Range   Hemoglobin A1C 5.7   LDL cholesterol, direct     Status: None   Collection Time: 01/05/17  4:02 PM  Result Value Ref Range   LDL Direct 86 0 - 99 mg/dL     Assessment/Plan:  HYPERTENSION, BENIGN ESSENTIAL Patient reports that she has had elevated blood pressures at home without chest pain,  shortness of breath changes in vision or headaches.  Blood pressure today is well controlled at 132/78 with losartan 50 mg daily.  Discussed return precautions with patient and recommended that if she is getting elevated blood pressures at home and she is concerned she can follow-up in nursing clinic for blood pressure check in the upcoming weeks.  Type 2 diabetes Currently diet controlled.  Patient had hemoglobin A1c today 5.7.  Congratulated patient and discussed the importance of continuing on the path that she is currently on.  Will follow-up A1c in the future.  Health maintenance Patient is due for colonoscopy and I placed a referral to gastroenterology.  Also mentioned that she has not had an eye exam in quite some time.  I placed a ophthalmology referral for a eye exam.  Patient refused flu shot today.  I encouraged her to think about it and come back to the office anytime for the shot.  Kahlel Peake L. Rosalyn Gess, [place] Resident PGY-2 01/06/2017 4:44 PM

## 2017-01-05 NOTE — Patient Instructions (Addendum)
Tanya [name], you were seen today for a check up to check your sugar levels and your hemoglobin A1C, which was normal!  We are also checking your cholesterol levels today. Your blood pressure was normal today. If you notice that your blood pressure is elevated at home you can come here for a nursing visit to get it checked.   Please try gabapentin 100mg  three times a day and you can increase to 300mg  three times a day if you do not become drowsy.  Please follow up with your primary doctor in one month to follow up with this.   Very nice to meet you, Quillian Quince L. Rosalyn Gess, [place] [facility] Resident PGY-2 01/05/2017 3:54 PM

## 2017-01-06 ENCOUNTER — Ambulatory Visit
Admission: RE | Admit: 2017-01-06 | Discharge: 2017-01-06 | Disposition: A | Discharge status: Home / Self Care | Payer: BLUE CROSS/BLUE SHIELD | Source: Ambulatory Visit | Attending: Radiation Oncology | Admitting: Radiation Oncology

## 2017-01-06 ENCOUNTER — Ambulatory Visit: Payer: BLUE CROSS/BLUE SHIELD

## 2017-01-06 ENCOUNTER — Encounter: Payer: Self-pay | Admitting: Family Medicine

## 2017-01-06 DIAGNOSIS — I10 Essential (primary) hypertension: Secondary | ICD-10-CM | POA: Diagnosis not present

## 2017-01-06 DIAGNOSIS — E669 Obesity, unspecified: Secondary | ICD-10-CM | POA: Diagnosis not present

## 2017-01-06 DIAGNOSIS — R7303 Prediabetes: Secondary | ICD-10-CM | POA: Diagnosis not present

## 2017-01-06 DIAGNOSIS — Z888 Allergy status to other drugs, medicaments and biological substances status: Secondary | ICD-10-CM | POA: Diagnosis not present

## 2017-01-06 DIAGNOSIS — E785 Hyperlipidemia, unspecified: Secondary | ICD-10-CM | POA: Diagnosis not present

## 2017-01-06 DIAGNOSIS — M1990 Unspecified osteoarthritis, unspecified site: Secondary | ICD-10-CM | POA: Diagnosis not present

## 2017-01-06 DIAGNOSIS — Z510 Encounter for antineoplastic radiation therapy: Secondary | ICD-10-CM | POA: Diagnosis not present

## 2017-01-06 DIAGNOSIS — F419 Anxiety disorder, unspecified: Secondary | ICD-10-CM | POA: Diagnosis not present

## 2017-01-06 DIAGNOSIS — Z6832 Body mass index (BMI) 32.0-32.9, adult: Secondary | ICD-10-CM | POA: Diagnosis not present

## 2017-01-06 DIAGNOSIS — Z803 Family history of malignant neoplasm of breast: Secondary | ICD-10-CM | POA: Diagnosis not present

## 2017-01-06 DIAGNOSIS — Z79899 Other long term (current) drug therapy: Secondary | ICD-10-CM | POA: Diagnosis not present

## 2017-01-06 DIAGNOSIS — C50412 Malignant neoplasm of upper-outer quadrant of left female breast: Secondary | ICD-10-CM | POA: Diagnosis not present

## 2017-01-06 DIAGNOSIS — Z170 Estrogen receptor positive status [ER+]: Secondary | ICD-10-CM | POA: Diagnosis not present

## 2017-01-06 DIAGNOSIS — G629 Polyneuropathy, unspecified: Secondary | ICD-10-CM | POA: Diagnosis not present

## 2017-01-06 DIAGNOSIS — C773 Secondary and unspecified malignant neoplasm of axilla and upper limb lymph nodes: Secondary | ICD-10-CM | POA: Diagnosis not present

## 2017-01-06 DIAGNOSIS — R110 Nausea: Secondary | ICD-10-CM | POA: Diagnosis not present

## 2017-01-06 LAB — LDL CHOLESTEROL, DIRECT: LDL DIRECT: 86 mg/dL (ref 0–99)

## 2017-01-06 NOTE — Assessment & Plan Note (Signed)
Patient reports that she has had elevated blood pressures at home without chest pain, shortness of breath changes in vision or headaches.  Blood pressure today is well controlled at 132/78 with losartan 50 mg daily.  Discussed return precautions with patient and recommended that if she is getting elevated blood pressures at home and she is concerned she can follow-up in nursing clinic for blood pressure check in the upcoming weeks.

## 2017-01-09 ENCOUNTER — Other Ambulatory Visit: Payer: Self-pay | Admitting: Student in an Organized Health Care Education/Training Program

## 2017-01-09 ENCOUNTER — Ambulatory Visit: Payer: BLUE CROSS/BLUE SHIELD

## 2017-01-09 ENCOUNTER — Ambulatory Visit
Admission: RE | Admit: 2017-01-09 | Discharge: 2017-01-09 | Disposition: A | Discharge status: Home / Self Care | Payer: BLUE CROSS/BLUE SHIELD | Source: Ambulatory Visit | Attending: Radiation Oncology | Admitting: Radiation Oncology

## 2017-01-09 DIAGNOSIS — Z803 Family history of malignant neoplasm of breast: Secondary | ICD-10-CM | POA: Diagnosis not present

## 2017-01-09 DIAGNOSIS — I10 Essential (primary) hypertension: Secondary | ICD-10-CM | POA: Diagnosis not present

## 2017-01-09 DIAGNOSIS — Z6832 Body mass index (BMI) 32.0-32.9, adult: Secondary | ICD-10-CM | POA: Diagnosis not present

## 2017-01-09 DIAGNOSIS — R110 Nausea: Secondary | ICD-10-CM | POA: Diagnosis not present

## 2017-01-09 DIAGNOSIS — E785 Hyperlipidemia, unspecified: Secondary | ICD-10-CM | POA: Diagnosis not present

## 2017-01-09 DIAGNOSIS — Z79899 Other long term (current) drug therapy: Secondary | ICD-10-CM | POA: Diagnosis not present

## 2017-01-09 DIAGNOSIS — C50412 Malignant neoplasm of upper-outer quadrant of left female breast: Secondary | ICD-10-CM | POA: Diagnosis not present

## 2017-01-09 DIAGNOSIS — E669 Obesity, unspecified: Secondary | ICD-10-CM | POA: Diagnosis not present

## 2017-01-09 DIAGNOSIS — Z510 Encounter for antineoplastic radiation therapy: Secondary | ICD-10-CM | POA: Diagnosis not present

## 2017-01-09 DIAGNOSIS — M1990 Unspecified osteoarthritis, unspecified site: Secondary | ICD-10-CM | POA: Diagnosis not present

## 2017-01-09 DIAGNOSIS — Z170 Estrogen receptor positive status [ER+]: Secondary | ICD-10-CM | POA: Diagnosis not present

## 2017-01-09 DIAGNOSIS — R7303 Prediabetes: Secondary | ICD-10-CM | POA: Diagnosis not present

## 2017-01-09 DIAGNOSIS — Z888 Allergy status to other drugs, medicaments and biological substances status: Secondary | ICD-10-CM | POA: Diagnosis not present

## 2017-01-09 DIAGNOSIS — F419 Anxiety disorder, unspecified: Secondary | ICD-10-CM | POA: Diagnosis not present

## 2017-01-09 DIAGNOSIS — G629 Polyneuropathy, unspecified: Secondary | ICD-10-CM | POA: Diagnosis not present

## 2017-01-09 DIAGNOSIS — C773 Secondary and unspecified malignant neoplasm of axilla and upper limb lymph nodes: Secondary | ICD-10-CM | POA: Diagnosis not present

## 2017-01-10 ENCOUNTER — Ambulatory Visit: Payer: BLUE CROSS/BLUE SHIELD

## 2017-01-10 ENCOUNTER — Ambulatory Visit
Admission: RE | Admit: 2017-01-10 | Discharge: 2017-01-10 | Disposition: A | Discharge status: Home / Self Care | Payer: BLUE CROSS/BLUE SHIELD | Source: Ambulatory Visit | Attending: Radiation Oncology | Admitting: Radiation Oncology

## 2017-01-10 ENCOUNTER — Encounter: Payer: Self-pay | Admitting: *Deleted

## 2017-01-10 DIAGNOSIS — Z888 Allergy status to other drugs, medicaments and biological substances status: Secondary | ICD-10-CM | POA: Diagnosis not present

## 2017-01-10 DIAGNOSIS — Z510 Encounter for antineoplastic radiation therapy: Secondary | ICD-10-CM | POA: Diagnosis not present

## 2017-01-10 DIAGNOSIS — R7303 Prediabetes: Secondary | ICD-10-CM | POA: Diagnosis not present

## 2017-01-10 DIAGNOSIS — C773 Secondary and unspecified malignant neoplasm of axilla and upper limb lymph nodes: Secondary | ICD-10-CM | POA: Diagnosis not present

## 2017-01-10 DIAGNOSIS — Z170 Estrogen receptor positive status [ER+]: Secondary | ICD-10-CM | POA: Diagnosis not present

## 2017-01-10 DIAGNOSIS — Z803 Family history of malignant neoplasm of breast: Secondary | ICD-10-CM | POA: Diagnosis not present

## 2017-01-10 DIAGNOSIS — F419 Anxiety disorder, unspecified: Secondary | ICD-10-CM | POA: Diagnosis not present

## 2017-01-10 DIAGNOSIS — M1990 Unspecified osteoarthritis, unspecified site: Secondary | ICD-10-CM | POA: Diagnosis not present

## 2017-01-10 DIAGNOSIS — R110 Nausea: Secondary | ICD-10-CM | POA: Diagnosis not present

## 2017-01-10 DIAGNOSIS — E785 Hyperlipidemia, unspecified: Secondary | ICD-10-CM | POA: Diagnosis not present

## 2017-01-10 DIAGNOSIS — Z79899 Other long term (current) drug therapy: Secondary | ICD-10-CM | POA: Diagnosis not present

## 2017-01-10 DIAGNOSIS — G629 Polyneuropathy, unspecified: Secondary | ICD-10-CM | POA: Diagnosis not present

## 2017-01-10 DIAGNOSIS — E669 Obesity, unspecified: Secondary | ICD-10-CM | POA: Diagnosis not present

## 2017-01-10 DIAGNOSIS — I10 Essential (primary) hypertension: Secondary | ICD-10-CM | POA: Diagnosis not present

## 2017-01-10 DIAGNOSIS — Z6832 Body mass index (BMI) 32.0-32.9, adult: Secondary | ICD-10-CM | POA: Diagnosis not present

## 2017-01-10 DIAGNOSIS — C50412 Malignant neoplasm of upper-outer quadrant of left female breast: Secondary | ICD-10-CM | POA: Diagnosis not present

## 2017-01-10 NOTE — Progress Notes (Signed)
[place] Psychosocial Distress Screening Clinical Social Work  Clinical Social Work was referred by distress screening protocol.  The patient scored a 5 on the Psychosocial Distress Thermometer which indicates moderate distress. Clinical Social Worker contacted patient at home to assess for distress and other psychosocial needs.  CSW left patient a detailed message offerring support and information on the support team and support programs at [facility]  CSW encouraged patient to call back with any questions or concerns.   ONCBCN DISTRESS SCREENING 12/01/2016  Screening Type Initial Screening  Distress experienced in past week (1-10) 5  Emotional problem type Boredom;Adjusting to appearance changes  Information Concerns Type   Physical Problem type Pain;Nausea/vomiting;Sleep/insomnia;Tingling hands/feet  Referral to support programs     Johnnye Lana, MSW, LCSW, OSW-C Clinical Social Worker [facility] [phone]

## 2017-01-11 ENCOUNTER — Ambulatory Visit: Payer: BLUE CROSS/BLUE SHIELD

## 2017-01-11 ENCOUNTER — Ambulatory Visit
Admission: RE | Admit: 2017-01-11 | Discharge: 2017-01-11 | Disposition: A | Discharge status: Home / Self Care | Payer: BLUE CROSS/BLUE SHIELD | Source: Ambulatory Visit | Attending: Radiation Oncology | Admitting: Radiation Oncology

## 2017-01-11 DIAGNOSIS — R7303 Prediabetes: Secondary | ICD-10-CM | POA: Diagnosis not present

## 2017-01-11 DIAGNOSIS — Z803 Family history of malignant neoplasm of breast: Secondary | ICD-10-CM | POA: Diagnosis not present

## 2017-01-11 DIAGNOSIS — C50412 Malignant neoplasm of upper-outer quadrant of left female breast: Secondary | ICD-10-CM | POA: Diagnosis not present

## 2017-01-11 DIAGNOSIS — Z510 Encounter for antineoplastic radiation therapy: Secondary | ICD-10-CM | POA: Diagnosis not present

## 2017-01-11 DIAGNOSIS — Z6832 Body mass index (BMI) 32.0-32.9, adult: Secondary | ICD-10-CM | POA: Diagnosis not present

## 2017-01-11 DIAGNOSIS — I10 Essential (primary) hypertension: Secondary | ICD-10-CM | POA: Diagnosis not present

## 2017-01-11 DIAGNOSIS — E669 Obesity, unspecified: Secondary | ICD-10-CM | POA: Diagnosis not present

## 2017-01-11 DIAGNOSIS — R110 Nausea: Secondary | ICD-10-CM | POA: Diagnosis not present

## 2017-01-11 DIAGNOSIS — C773 Secondary and unspecified malignant neoplasm of axilla and upper limb lymph nodes: Secondary | ICD-10-CM | POA: Diagnosis not present

## 2017-01-11 DIAGNOSIS — G629 Polyneuropathy, unspecified: Secondary | ICD-10-CM | POA: Diagnosis not present

## 2017-01-11 DIAGNOSIS — M1990 Unspecified osteoarthritis, unspecified site: Secondary | ICD-10-CM | POA: Diagnosis not present

## 2017-01-11 DIAGNOSIS — Z170 Estrogen receptor positive status [ER+]: Secondary | ICD-10-CM | POA: Diagnosis not present

## 2017-01-11 DIAGNOSIS — Z79899 Other long term (current) drug therapy: Secondary | ICD-10-CM | POA: Diagnosis not present

## 2017-01-11 DIAGNOSIS — Z888 Allergy status to other drugs, medicaments and biological substances status: Secondary | ICD-10-CM | POA: Diagnosis not present

## 2017-01-11 DIAGNOSIS — F419 Anxiety disorder, unspecified: Secondary | ICD-10-CM | POA: Diagnosis not present

## 2017-01-11 DIAGNOSIS — E785 Hyperlipidemia, unspecified: Secondary | ICD-10-CM | POA: Diagnosis not present

## 2017-01-12 ENCOUNTER — Ambulatory Visit
Admission: RE | Admit: 2017-01-12 | Discharge: 2017-01-12 | Disposition: A | Discharge status: Home / Self Care | Payer: BLUE CROSS/BLUE SHIELD | Source: Ambulatory Visit | Attending: Radiation Oncology | Admitting: Radiation Oncology

## 2017-01-12 ENCOUNTER — Ambulatory Visit: Payer: BLUE CROSS/BLUE SHIELD

## 2017-01-12 DIAGNOSIS — R110 Nausea: Secondary | ICD-10-CM | POA: Diagnosis not present

## 2017-01-12 DIAGNOSIS — F419 Anxiety disorder, unspecified: Secondary | ICD-10-CM | POA: Diagnosis not present

## 2017-01-12 DIAGNOSIS — E669 Obesity, unspecified: Secondary | ICD-10-CM | POA: Diagnosis not present

## 2017-01-12 DIAGNOSIS — Z510 Encounter for antineoplastic radiation therapy: Secondary | ICD-10-CM | POA: Diagnosis not present

## 2017-01-12 DIAGNOSIS — C773 Secondary and unspecified malignant neoplasm of axilla and upper limb lymph nodes: Secondary | ICD-10-CM | POA: Diagnosis not present

## 2017-01-12 DIAGNOSIS — Z803 Family history of malignant neoplasm of breast: Secondary | ICD-10-CM | POA: Diagnosis not present

## 2017-01-12 DIAGNOSIS — E785 Hyperlipidemia, unspecified: Secondary | ICD-10-CM | POA: Diagnosis not present

## 2017-01-12 DIAGNOSIS — Z888 Allergy status to other drugs, medicaments and biological substances status: Secondary | ICD-10-CM | POA: Diagnosis not present

## 2017-01-12 DIAGNOSIS — Z6832 Body mass index (BMI) 32.0-32.9, adult: Secondary | ICD-10-CM | POA: Diagnosis not present

## 2017-01-12 DIAGNOSIS — G629 Polyneuropathy, unspecified: Secondary | ICD-10-CM | POA: Diagnosis not present

## 2017-01-12 DIAGNOSIS — M1990 Unspecified osteoarthritis, unspecified site: Secondary | ICD-10-CM | POA: Diagnosis not present

## 2017-01-12 DIAGNOSIS — Z170 Estrogen receptor positive status [ER+]: Secondary | ICD-10-CM | POA: Diagnosis not present

## 2017-01-12 DIAGNOSIS — Z79899 Other long term (current) drug therapy: Secondary | ICD-10-CM | POA: Diagnosis not present

## 2017-01-12 DIAGNOSIS — R7303 Prediabetes: Secondary | ICD-10-CM | POA: Diagnosis not present

## 2017-01-12 DIAGNOSIS — C50412 Malignant neoplasm of upper-outer quadrant of left female breast: Secondary | ICD-10-CM | POA: Diagnosis not present

## 2017-01-12 DIAGNOSIS — I10 Essential (primary) hypertension: Secondary | ICD-10-CM | POA: Diagnosis not present

## 2017-01-13 ENCOUNTER — Ambulatory Visit: Payer: BLUE CROSS/BLUE SHIELD

## 2017-01-13 ENCOUNTER — Ambulatory Visit
Admission: RE | Admit: 2017-01-13 | Discharge: 2017-01-13 | Disposition: A | Discharge status: Home / Self Care | Payer: BLUE CROSS/BLUE SHIELD | Source: Ambulatory Visit | Attending: Radiation Oncology | Admitting: Radiation Oncology

## 2017-01-13 DIAGNOSIS — C773 Secondary and unspecified malignant neoplasm of axilla and upper limb lymph nodes: Secondary | ICD-10-CM | POA: Diagnosis not present

## 2017-01-13 DIAGNOSIS — Z888 Allergy status to other drugs, medicaments and biological substances status: Secondary | ICD-10-CM | POA: Diagnosis not present

## 2017-01-13 DIAGNOSIS — Z510 Encounter for antineoplastic radiation therapy: Secondary | ICD-10-CM | POA: Diagnosis not present

## 2017-01-13 DIAGNOSIS — M1990 Unspecified osteoarthritis, unspecified site: Secondary | ICD-10-CM | POA: Diagnosis not present

## 2017-01-13 DIAGNOSIS — E669 Obesity, unspecified: Secondary | ICD-10-CM | POA: Diagnosis not present

## 2017-01-13 DIAGNOSIS — R110 Nausea: Secondary | ICD-10-CM | POA: Diagnosis not present

## 2017-01-13 DIAGNOSIS — Z803 Family history of malignant neoplasm of breast: Secondary | ICD-10-CM | POA: Diagnosis not present

## 2017-01-13 DIAGNOSIS — F419 Anxiety disorder, unspecified: Secondary | ICD-10-CM | POA: Diagnosis not present

## 2017-01-13 DIAGNOSIS — G629 Polyneuropathy, unspecified: Secondary | ICD-10-CM | POA: Diagnosis not present

## 2017-01-13 DIAGNOSIS — C50412 Malignant neoplasm of upper-outer quadrant of left female breast: Secondary | ICD-10-CM | POA: Diagnosis not present

## 2017-01-13 DIAGNOSIS — Z170 Estrogen receptor positive status [ER+]: Secondary | ICD-10-CM | POA: Diagnosis not present

## 2017-01-13 DIAGNOSIS — Z6832 Body mass index (BMI) 32.0-32.9, adult: Secondary | ICD-10-CM | POA: Diagnosis not present

## 2017-01-13 DIAGNOSIS — Z79899 Other long term (current) drug therapy: Secondary | ICD-10-CM | POA: Diagnosis not present

## 2017-01-13 DIAGNOSIS — I10 Essential (primary) hypertension: Secondary | ICD-10-CM | POA: Diagnosis not present

## 2017-01-13 DIAGNOSIS — R7303 Prediabetes: Secondary | ICD-10-CM | POA: Diagnosis not present

## 2017-01-13 DIAGNOSIS — E785 Hyperlipidemia, unspecified: Secondary | ICD-10-CM | POA: Diagnosis not present

## 2017-01-16 ENCOUNTER — Ambulatory Visit
Admission: RE | Admit: 2017-01-16 | Discharge: 2017-01-16 | Disposition: A | Discharge status: Home / Self Care | Payer: BLUE CROSS/BLUE SHIELD | Source: Ambulatory Visit | Attending: Radiation Oncology | Admitting: Radiation Oncology

## 2017-01-16 ENCOUNTER — Ambulatory Visit: Payer: BLUE CROSS/BLUE SHIELD

## 2017-01-16 DIAGNOSIS — G629 Polyneuropathy, unspecified: Secondary | ICD-10-CM | POA: Diagnosis not present

## 2017-01-16 DIAGNOSIS — Z510 Encounter for antineoplastic radiation therapy: Secondary | ICD-10-CM | POA: Diagnosis not present

## 2017-01-16 DIAGNOSIS — E785 Hyperlipidemia, unspecified: Secondary | ICD-10-CM | POA: Diagnosis not present

## 2017-01-16 DIAGNOSIS — Z170 Estrogen receptor positive status [ER+]: Secondary | ICD-10-CM | POA: Diagnosis not present

## 2017-01-16 DIAGNOSIS — Z888 Allergy status to other drugs, medicaments and biological substances status: Secondary | ICD-10-CM | POA: Diagnosis not present

## 2017-01-16 DIAGNOSIS — E669 Obesity, unspecified: Secondary | ICD-10-CM | POA: Diagnosis not present

## 2017-01-16 DIAGNOSIS — Z6832 Body mass index (BMI) 32.0-32.9, adult: Secondary | ICD-10-CM | POA: Diagnosis not present

## 2017-01-16 DIAGNOSIS — M1990 Unspecified osteoarthritis, unspecified site: Secondary | ICD-10-CM | POA: Diagnosis not present

## 2017-01-16 DIAGNOSIS — C773 Secondary and unspecified malignant neoplasm of axilla and upper limb lymph nodes: Secondary | ICD-10-CM | POA: Diagnosis not present

## 2017-01-16 DIAGNOSIS — R110 Nausea: Secondary | ICD-10-CM | POA: Diagnosis not present

## 2017-01-16 DIAGNOSIS — R7303 Prediabetes: Secondary | ICD-10-CM | POA: Diagnosis not present

## 2017-01-16 DIAGNOSIS — Z803 Family history of malignant neoplasm of breast: Secondary | ICD-10-CM | POA: Diagnosis not present

## 2017-01-16 DIAGNOSIS — F419 Anxiety disorder, unspecified: Secondary | ICD-10-CM | POA: Diagnosis not present

## 2017-01-16 DIAGNOSIS — C50412 Malignant neoplasm of upper-outer quadrant of left female breast: Secondary | ICD-10-CM | POA: Diagnosis not present

## 2017-01-16 DIAGNOSIS — I10 Essential (primary) hypertension: Secondary | ICD-10-CM | POA: Diagnosis not present

## 2017-01-16 DIAGNOSIS — Z79899 Other long term (current) drug therapy: Secondary | ICD-10-CM | POA: Diagnosis not present

## 2017-01-17 ENCOUNTER — Ambulatory Visit
Admission: RE | Admit: 2017-01-17 | Discharge: 2017-01-17 | Disposition: A | Discharge status: Home / Self Care | Payer: BLUE CROSS/BLUE SHIELD | Source: Ambulatory Visit | Attending: Radiation Oncology | Admitting: Radiation Oncology

## 2017-01-17 ENCOUNTER — Ambulatory Visit: Payer: BLUE CROSS/BLUE SHIELD

## 2017-01-17 DIAGNOSIS — R110 Nausea: Secondary | ICD-10-CM | POA: Diagnosis not present

## 2017-01-17 DIAGNOSIS — Z803 Family history of malignant neoplasm of breast: Secondary | ICD-10-CM | POA: Diagnosis not present

## 2017-01-17 DIAGNOSIS — G629 Polyneuropathy, unspecified: Secondary | ICD-10-CM | POA: Diagnosis not present

## 2017-01-17 DIAGNOSIS — E669 Obesity, unspecified: Secondary | ICD-10-CM | POA: Diagnosis not present

## 2017-01-17 DIAGNOSIS — I10 Essential (primary) hypertension: Secondary | ICD-10-CM | POA: Diagnosis not present

## 2017-01-17 DIAGNOSIS — Z170 Estrogen receptor positive status [ER+]: Secondary | ICD-10-CM | POA: Diagnosis not present

## 2017-01-17 DIAGNOSIS — C773 Secondary and unspecified malignant neoplasm of axilla and upper limb lymph nodes: Secondary | ICD-10-CM | POA: Diagnosis not present

## 2017-01-17 DIAGNOSIS — E785 Hyperlipidemia, unspecified: Secondary | ICD-10-CM | POA: Diagnosis not present

## 2017-01-17 DIAGNOSIS — C50412 Malignant neoplasm of upper-outer quadrant of left female breast: Secondary | ICD-10-CM | POA: Diagnosis not present

## 2017-01-17 DIAGNOSIS — F419 Anxiety disorder, unspecified: Secondary | ICD-10-CM | POA: Diagnosis not present

## 2017-01-17 DIAGNOSIS — Z510 Encounter for antineoplastic radiation therapy: Secondary | ICD-10-CM | POA: Diagnosis not present

## 2017-01-17 DIAGNOSIS — Z6832 Body mass index (BMI) 32.0-32.9, adult: Secondary | ICD-10-CM | POA: Diagnosis not present

## 2017-01-17 DIAGNOSIS — Z888 Allergy status to other drugs, medicaments and biological substances status: Secondary | ICD-10-CM | POA: Diagnosis not present

## 2017-01-17 DIAGNOSIS — M1990 Unspecified osteoarthritis, unspecified site: Secondary | ICD-10-CM | POA: Diagnosis not present

## 2017-01-17 DIAGNOSIS — R7303 Prediabetes: Secondary | ICD-10-CM | POA: Diagnosis not present

## 2017-01-17 DIAGNOSIS — Z79899 Other long term (current) drug therapy: Secondary | ICD-10-CM | POA: Diagnosis not present

## 2017-01-17 NOTE — Progress Notes (Signed)
[place]  [phone] [phone]     ID: Tanya [name] DOB: 1956/03/02  MR#: [id number]  MVE#:[id number]  Patient Care Team: Everrett Coombe, MD as PCP - General Rolm Bookbinder, MD as Consulting Physician (General Surgery) Alaiya Martindelcampo, Virgie Dad, MD as Consulting Physician (Oncology) Kyung Rudd, MD as Consulting Physician (Radiation Oncology) OTHER MD:  CHIEF COMPLAINT: Estrogen receptor positive breast cancer  CURRENT TREATMENT: awaiting adjuvant radiation   BREAST CANCER HISTORY: From the original intake note:  Tanya [name] had routine bilateral screening mammography at the [facility] 04/19/2016. This showed a possible area of the symmetry in the left breast and a prominent left axillary lymph nodes. On 04/28/2016 she underwent diagnostic left mammography with tomography and left breast ultrasonography. This showed the breast density to be category B. There was an obscured mass in the upper outer left breast and an enlarged left axillary lymph node. On exam there was focal thickening at the 2:00 position of the left breast 9 cm from the nipple. Targeted ultrasonography confirmed a 1.8 cm hypoechoic mass with a second adjacent 1.1 cm mass both at the 2:00 position of the left breast 9 cm from the nipple. The conglomerate area measured 3.5 cm. There was also an enlarged left axillary lymph node with loss of fatty hilum measuring 1.8 cm.  On family 16 2018 the patient underwent biopsy of the larger left breast mass in question and the suspicious left axillary lymph node. 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  The patient's subsequent history is as detailed below  INTERVAL HISTORY: Tanya [name] returns today for follow-up and  treatment of her estrogen receptor positive breast cancer, accompanied by her husband.   She is currently receiving adjuvant radiation treatment, having received 23 of 33 planned cycles.  She is generally tolerating it well although there are some changes in her left shoulder that she wanted [place] to look at today  REVIEW OF SYSTEMS: Tanya [name] reports that she is well overall. She noticed some bumps and discoloration on her left neck and shoulder on Friday 01/13/2017, when she visited Dr. Lisbeth Renshaw. She notes that this area irritates her and she uses olive oil and radiaplex to aid with this. She notes that her left breast is bigger than her right, but notes the lump in her left breast is smaller. She has noticed a pinching sensation in her legs, which she notes could be due to her skin being dry. She reports that her fingernails are turning black. 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 She has made her PCP aware of her neuropathy and she was prescribed gabapentin which she will start once she has completed radiation therapy. She has 10 treatments of radiation therapy which will be completed by 02/01/2017. For exercise, she has started walking for about 25 minutes, which has helped her mobility. She enjoys having someone walk with her. She denies unusual headaches, visual changes, nausea, vomiting, or dizziness. There has been no unusual cough, phlegm production, or pleurisy. This been no change in bowel or bladder habits. She denies unexplained fatigue or unexplained weight loss, bleeding, rash, or fever. A detailed review of systems was otherwise stable.    PAST MEDICAL HISTORY: Past Medical History:  Diagnosis Date  . ANXIETY   . Arthritis   . Dyspnea on exertion   .  HYPERLIPIDEMIA   . HYPERTENSION, BENIGN ESSENTIAL   . Malignant neoplasm of upper-outer quadrant of left breast in female, estrogen receptor positive ([place]) 05/04/2016  . OBESITY   . Pre-diabetes    no medications    PAST SURGICAL  HISTORY: Past Surgical History:  Procedure Laterality Date  . CESAREAN SECTION    . TUBAL LIGATION      FAMILY HISTORY Family History  Problem Relation Age of Onset  . Breast cancer Mother 64  The patient's father had a history of prostate cancer. He died at age 72. The patient's mother died at age 35 from unclear causes. She had a history of breast cancer diagnosed in her 76s. The patient had 4 brothers, 2 sisters. There is no other history of breast or ovarian cancer in the family to the patient's knowledge  GYNECOLOGIC HISTORY:  No LMP recorded. Patient is postmenopausal. Menarche age 9, first live birth age 5, the patient is Tanya [name] P4. She stopped having periods approximately 2006. She did not use hormone replacement. She used oral contraceptives briefly and remotely, with no complications.  SOCIAL HISTORY:  The patient is originally from [name]. 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 The patient's husband [name] was also a professor at [employer], where he taught agriculture oh research. Son [name] works in Land, son [name] works for [employer], daughter [name] is a Education officer, museum, and daughter [name] is also a Education officer, museum, all living in [place]. The patient has 3 grandchildren. She attends [employer]    ADVANCED DIRECTIVES: Not in place   HEALTH MAINTENANCE: Social History   Tobacco Use  . Smoking status: Never Smoker  . Smokeless tobacco: Never Used  Substance Use Topics  . Alcohol use: Yes    Comment: rarely  . Drug use: No     Colonoscopy:Never  PAP: November 2017  Bone density: Never   Allergies  Allergen Reactions  . Ascorbic Acid & Derivatives Other (See Comments)    Mouth sores (Vitamin C) with higher doses  . Camoquin [Amodiaquine] Itching    Current Outpatient Medications  Medication Sig Dispense Refill  . gabapentin (NEURONTIN) 100 MG capsule Take 1 capsule (100 mg total) by mouth 3 (three) times daily. 90 capsule 0    . hyaluronate sodium (RADIAPLEXRX) GEL Apply 1 application topically 2 (two) times daily. Apply to breat treated after rad tx daily and at bedtime, nothing 4 hours prior to tx    . losartan (COZAAR) 50 MG tablet TAKE 1 TABLET (50 MG TOTAL) BY MOUTH DAILY. 90 tablet 2  . Multiple Vitamin (MULTIVITAMIN WITH MINERALS) TABS tablet Take 1 tablet by mouth 3 (three) times a week. One-A-Day Women's 50+    . neomycin-bacitracin-polymyxin (NEOSPORIN) ointment Apply 1 application topically 2 (two) times daily as needed (for boil wound care).    . non-metallic deodorant Jethro Poling) MISC Apply 1 application topically.    . pravastatin (PRAVACHOL) 40 MG tablet TAKE 1 TABLET (40 MG TOTAL) BY MOUTH AT BEDTIME. 90 tablet 2  . simethicone (MYLICON) 80 MG chewable tablet Chew 80 mg by mouth every 6 (six) hours as needed (for gas).     . methocarbamol (ROBAXIN) 750 MG tablet Take 1 tablet (750 mg total) by mouth 4 (four) times daily as needed (use for muscle cramps/pain). (Patient not taking: Reported on 11/24/2016) 15 tablet 0  . omeprazole (PRILOSEC) 40 MG capsule Take 1 capsule (40 mg total) by mouth daily. (Patient not taking: Reported on 10/21/2016) 90 capsule 3  .  oxyCODONE (OXY IR/ROXICODONE) 5 MG immediate release tablet Take 1 tablet (5 mg total) by mouth every 6 (six) hours as needed for moderate pain, severe pain or breakthrough pain. (Patient not taking: Reported on 11/24/2016) 15 tablet 0  . prochlorperazine (COMPAZINE) 10 MG tablet Take 1 tablet (10 mg total) by mouth every 6 (six) hours as needed (Nausea or vomiting). (Patient not taking: Reported on 10/21/2016) 30 tablet 1   No current facility-administered medications for this visit.     OBJECTIVE: Middle-aged African-American woman who appears stated age  3:   01/18/17 1511  BP: 140/79  Pulse: 98  Resp: 18  Temp: 98.2 F (36.8 C)  SpO2: 100%     Body mass index is 33.3 kg/m.    ECOG FS:1 - Symptomatic but completely ambulatory  Sclerae  unicteric, EOMs intact Oropharynx clear and moist No cervical or supraclavicular adenopathy Lungs no rales or rhonchi Heart regular rate and rhythm Abd soft, nontender, positive bowel sounds MSK no focal spinal tenderness, no upper extremity lymphedema Neuro: nonfocal, well oriented, appropriate affect Breasts: The right breast is benign for the left breast is currently receiving radiation.  There is some hyperpigmentation, but not yet any disclamation although desquamation appears likely in the inferior mammary fold.  There is some darkening of the skin in the left lower neck area and supraclavicular area which is the area of concern to the patient.  It looks fine at present  LAB RESULTS:  CMP     Component Value Date/Time   NA 141 01/18/2017 1425   K 3.8 01/18/2017 1425   CL 107 10/25/2016 1050   CO2 27 01/18/2017 1425   GLUCOSE 107 01/18/2017 1425   BUN 12.5 01/18/2017 1425   CREATININE 0.8 01/18/2017 1425   CALCIUM 9.0 01/18/2017 1425   PROT 6.7 01/18/2017 1425   ALBUMIN 3.7 01/18/2017 1425   AST 19 01/18/2017 1425   ALT 18 01/18/2017 1425   ALKPHOS 73 01/18/2017 1425   BILITOT 0.30 01/18/2017 1425   GFRNONAA >60 10/25/2016 1050   GFRAA >60 10/25/2016 1050    INo results found for: SPEP, UPEP  CBC    Component Value Date/Time   WBC 4.0 01/18/2017 1425   WBC 6.1 10/25/2016 1050   RBC 5.11 01/18/2017 1425   RBC 4.30 10/25/2016 1050   HGB 12.5 01/18/2017 1425   HCT 39.5 01/18/2017 1425   PLT 164 01/18/2017 1425   MCV 77.1 (L) 01/18/2017 1425   MCH 24.4 (L) 01/18/2017 1425   MCH 26.3 10/25/2016 1050   MCHC 31.6 01/18/2017 1425   MCHC 32.4 10/25/2016 1050   RDW 18.1 (H) 01/18/2017 1425   LYMPHSABS 0.9 01/18/2017 1425   MONOABS 0.5 01/18/2017 1425   EOSABS 0.1 01/18/2017 1425   BASOSABS 0.0 01/18/2017 1425        Chemistry      Component Value Date/Time   NA 141 01/18/2017 1425   K 3.8 01/18/2017 1425   CL 107 10/25/2016 1050   CO2 27 01/18/2017 1425    BUN 12.5 01/18/2017 1425   CREATININE 0.8 01/18/2017 1425      Component Value Date/Time   CALCIUM 9.0 01/18/2017 1425   ALKPHOS 73 01/18/2017 1425   AST 19 01/18/2017 1425   ALT 18 01/18/2017 1425   BILITOT 0.30 01/18/2017 1425       No results found for: LABCA2  No components found for: LABCA125  No results for input(s): INR in the last 168 hours.  Urinalysis  Component Value Date/Time   COLORURINE yellow 06/01/2006 1116   APPEARANCEUR Clear 06/01/2006 1116   LABSPEC 1.010 08/18/2016 1217   PHURINE 6.5 08/18/2016 1217   PHURINE 6.5 06/01/2006 1116   GLUCOSEU Negative 08/18/2016 1217   HGBUR Negative 08/18/2016 1217   HGBUR negative 06/01/2006 1116   BILIRUBINUR Negative 08/18/2016 1217   KETONESUR Negative 08/18/2016 1217   PROTEINUR Negative 08/18/2016 1217   UROBILINOGEN 0.2 08/18/2016 1217   NITRITE Negative 08/18/2016 1217   NITRITE negative 06/01/2006 1116   LEUKOCYTESUR Negative 08/18/2016 1217     STUDIES: No results found.  ELIGIBLE FOR AVAILABLE RESEARCH PROTOCOL: Not a candidate for Prevent (already on a statin drug)  ASSESSMENT: 61 y.o.  woman status post left breast upper inner quadrant and left axillary lymph node biopsy every 16 2018, both showing invasive ductal carcinoma, grade 3, clinically T1c N1a, or stage IIB, estrogen receptor positive, progesterone receptor and HER-2 negative, with an MIB-1 of 40%  (a) MRI biopsy 05/25/2016 of a second area of concern in the left breast also shows invasive ductal carcinoma  (1) neoadjuvant anastrozole started 05/11/2016, held at the start of chemotherapy  (2) Mammaprint results shows the tumor to be luminal B, high risk, indicating a need for chemotherapy.  (3) chemotherapy consisting of doxorubicin and cyclophosphamide in dose dense fashion 4 starting 06/09/2016, completed 07/21/2016, followed by weekly paclitaxel started 08/11/2016  (a) paclitaxel stopped after cycle 8 because of neuropathy;  last dose 09/29/2016  (4) left lumpectomy and sentinel lymph node sampling 11/03/2016 showed no residual tumor in the breast. One of 2 sentinel lymph nodes was involved by tumor. Margins were clear. pT0 pN1  (5) adjuvant radiation to be completed February 01, 2017  (6) to resume anastrozole 02/11/2017  PLAN: Alyha was very concerned that she was receiving ready area.  She actually thought it was a mistake.  She thought some of the students were being trained in the radiation oncology actually planned her radiation and made a mistake.  I reassured her that that does not happen.  They are extremely careful.  The students are there to help but they do not do any other planning or actual treatment.  She understands of breast cancer can spread to the left supraclavicular and left neck areas and that is why she is receiving radiation is to  I commended her for excellent exercise program.  I strongly urged her to continue.  She will be ready to start her on anastrozole again February 11, 2017.  The plan will be to continue that for a total of 5 years.  She will return to see me in February 2019.  She knows to call for any other issues that may develop before that visit.  Laterria Lasota, Virgie Dad, MD  01/18/17 3:42 PM Medical Oncology and Hematology [facility] [address] [place], [place] [address] Tel. [phone]    Fax. [phone]  This document serves as a record of services personally performed by Lurline Del, MD. It was created on his behalf by Sheron Nightingale, a trained medical scribe. The creation of this record is based on the scribe's personal observations and the provider's statements to them.   I have reviewed the above documentation for accuracy and completeness, and I agree with the above.

## 2017-01-18 ENCOUNTER — Ambulatory Visit
Admission: RE | Admit: 2017-01-18 | Discharge: 2017-01-18 | Disposition: A | Discharge status: Home / Self Care | Payer: BLUE CROSS/BLUE SHIELD | Source: Ambulatory Visit | Attending: Radiation Oncology | Admitting: Radiation Oncology

## 2017-01-18 ENCOUNTER — Ambulatory Visit (HOSPITAL_BASED_OUTPATIENT_CLINIC_OR_DEPARTMENT_OTHER): Payer: BLUE CROSS/BLUE SHIELD | Admitting: Oncology

## 2017-01-18 ENCOUNTER — Other Ambulatory Visit (HOSPITAL_BASED_OUTPATIENT_CLINIC_OR_DEPARTMENT_OTHER): Payer: BLUE CROSS/BLUE SHIELD

## 2017-01-18 ENCOUNTER — Ambulatory Visit: Payer: BLUE CROSS/BLUE SHIELD

## 2017-01-18 VITALS — BP 140/79 | HR 98 | Temp 98.2°F | Resp 18 | Ht 63.0 in | Wt 188.0 lb

## 2017-01-18 DIAGNOSIS — C50412 Malignant neoplasm of upper-outer quadrant of left female breast: Secondary | ICD-10-CM

## 2017-01-18 DIAGNOSIS — Z170 Estrogen receptor positive status [ER+]: Secondary | ICD-10-CM

## 2017-01-18 DIAGNOSIS — R110 Nausea: Secondary | ICD-10-CM | POA: Diagnosis not present

## 2017-01-18 DIAGNOSIS — Z79899 Other long term (current) drug therapy: Secondary | ICD-10-CM | POA: Diagnosis not present

## 2017-01-18 DIAGNOSIS — I10 Essential (primary) hypertension: Secondary | ICD-10-CM | POA: Diagnosis not present

## 2017-01-18 DIAGNOSIS — E669 Obesity, unspecified: Secondary | ICD-10-CM | POA: Diagnosis not present

## 2017-01-18 DIAGNOSIS — Z510 Encounter for antineoplastic radiation therapy: Secondary | ICD-10-CM | POA: Diagnosis not present

## 2017-01-18 DIAGNOSIS — C773 Secondary and unspecified malignant neoplasm of axilla and upper limb lymph nodes: Secondary | ICD-10-CM

## 2017-01-18 DIAGNOSIS — G629 Polyneuropathy, unspecified: Secondary | ICD-10-CM | POA: Diagnosis not present

## 2017-01-18 DIAGNOSIS — F419 Anxiety disorder, unspecified: Secondary | ICD-10-CM | POA: Diagnosis not present

## 2017-01-18 DIAGNOSIS — M1990 Unspecified osteoarthritis, unspecified site: Secondary | ICD-10-CM | POA: Diagnosis not present

## 2017-01-18 DIAGNOSIS — Z6832 Body mass index (BMI) 32.0-32.9, adult: Secondary | ICD-10-CM | POA: Diagnosis not present

## 2017-01-18 DIAGNOSIS — Z803 Family history of malignant neoplasm of breast: Secondary | ICD-10-CM | POA: Diagnosis not present

## 2017-01-18 DIAGNOSIS — R7303 Prediabetes: Secondary | ICD-10-CM | POA: Diagnosis not present

## 2017-01-18 DIAGNOSIS — Z888 Allergy status to other drugs, medicaments and biological substances status: Secondary | ICD-10-CM | POA: Diagnosis not present

## 2017-01-18 DIAGNOSIS — E785 Hyperlipidemia, unspecified: Secondary | ICD-10-CM | POA: Diagnosis not present

## 2017-01-18 LAB — CBC WITH DIFFERENTIAL/PLATELET
BASO%: 0.6 % (ref 0.0–2.0)
Basophils Absolute: 0 10*3/uL (ref 0.0–0.1)
EOS%: 1.5 % (ref 0.0–7.0)
Eosinophils Absolute: 0.1 10*3/uL (ref 0.0–0.5)
HCT: 39.5 % (ref 34.8–46.6)
HGB: 12.5 g/dL (ref 11.6–15.9)
LYMPH%: 23.3 % (ref 14.0–49.7)
MCH: 24.4 pg — ABNORMAL LOW (ref 25.1–34.0)
MCHC: 31.6 g/dL (ref 31.5–36.0)
MCV: 77.1 fL — ABNORMAL LOW (ref 79.5–101.0)
MONO#: 0.5 10*3/uL (ref 0.1–0.9)
MONO%: 12 % (ref 0.0–14.0)
NEUT#: 2.5 10*3/uL (ref 1.5–6.5)
NEUT%: 62.6 % (ref 38.4–76.8)
PLATELETS: 164 10*3/uL (ref 145–400)
RBC: 5.11 10*6/uL (ref 3.70–5.45)
RDW: 18.1 % — ABNORMAL HIGH (ref 11.2–14.5)
WBC: 4 10*3/uL (ref 3.9–10.3)
lymph#: 0.9 10*3/uL (ref 0.9–3.3)

## 2017-01-18 LAB — COMPREHENSIVE METABOLIC PANEL
ALT: 18 U/L (ref 0–55)
ANION GAP: 8 meq/L (ref 3–11)
AST: 19 U/L (ref 5–34)
Albumin: 3.7 g/dL (ref 3.5–5.0)
Alkaline Phosphatase: 73 U/L (ref 40–150)
BUN: 12.5 mg/dL (ref 7.0–26.0)
CHLORIDE: 107 meq/L (ref 98–109)
CO2: 27 meq/L (ref 22–29)
Calcium: 9 mg/dL (ref 8.4–10.4)
Creatinine: 0.8 mg/dL (ref 0.6–1.1)
Glucose: 107 mg/dl (ref 70–140)
Potassium: 3.8 mEq/L (ref 3.5–5.1)
Sodium: 141 mEq/L (ref 136–145)
Total Bilirubin: 0.3 mg/dL (ref 0.20–1.20)
Total Protein: 6.7 g/dL (ref 6.4–8.3)

## 2017-01-19 ENCOUNTER — Ambulatory Visit
Admission: RE | Admit: 2017-01-19 | Discharge: 2017-01-19 | Disposition: A | Discharge status: Home / Self Care | Payer: BLUE CROSS/BLUE SHIELD | Source: Ambulatory Visit | Attending: Radiation Oncology | Admitting: Radiation Oncology

## 2017-01-19 ENCOUNTER — Ambulatory Visit: Payer: BLUE CROSS/BLUE SHIELD

## 2017-01-19 DIAGNOSIS — E785 Hyperlipidemia, unspecified: Secondary | ICD-10-CM | POA: Diagnosis not present

## 2017-01-19 DIAGNOSIS — E669 Obesity, unspecified: Secondary | ICD-10-CM | POA: Diagnosis not present

## 2017-01-19 DIAGNOSIS — Z170 Estrogen receptor positive status [ER+]: Secondary | ICD-10-CM | POA: Diagnosis not present

## 2017-01-19 DIAGNOSIS — Z79899 Other long term (current) drug therapy: Secondary | ICD-10-CM | POA: Diagnosis not present

## 2017-01-19 DIAGNOSIS — F419 Anxiety disorder, unspecified: Secondary | ICD-10-CM | POA: Diagnosis not present

## 2017-01-19 DIAGNOSIS — Z510 Encounter for antineoplastic radiation therapy: Secondary | ICD-10-CM | POA: Diagnosis not present

## 2017-01-19 DIAGNOSIS — C50412 Malignant neoplasm of upper-outer quadrant of left female breast: Secondary | ICD-10-CM | POA: Diagnosis not present

## 2017-01-19 DIAGNOSIS — I10 Essential (primary) hypertension: Secondary | ICD-10-CM | POA: Diagnosis not present

## 2017-01-19 DIAGNOSIS — R7303 Prediabetes: Secondary | ICD-10-CM | POA: Diagnosis not present

## 2017-01-19 DIAGNOSIS — M1990 Unspecified osteoarthritis, unspecified site: Secondary | ICD-10-CM | POA: Diagnosis not present

## 2017-01-19 DIAGNOSIS — G629 Polyneuropathy, unspecified: Secondary | ICD-10-CM | POA: Diagnosis not present

## 2017-01-19 DIAGNOSIS — R110 Nausea: Secondary | ICD-10-CM | POA: Diagnosis not present

## 2017-01-19 DIAGNOSIS — C773 Secondary and unspecified malignant neoplasm of axilla and upper limb lymph nodes: Secondary | ICD-10-CM | POA: Diagnosis not present

## 2017-01-19 DIAGNOSIS — Z6832 Body mass index (BMI) 32.0-32.9, adult: Secondary | ICD-10-CM | POA: Diagnosis not present

## 2017-01-19 DIAGNOSIS — Z803 Family history of malignant neoplasm of breast: Secondary | ICD-10-CM | POA: Diagnosis not present

## 2017-01-19 DIAGNOSIS — Z888 Allergy status to other drugs, medicaments and biological substances status: Secondary | ICD-10-CM | POA: Diagnosis not present

## 2017-01-20 ENCOUNTER — Ambulatory Visit: Payer: BLUE CROSS/BLUE SHIELD | Admitting: Radiation Oncology

## 2017-01-20 ENCOUNTER — Ambulatory Visit: Payer: BLUE CROSS/BLUE SHIELD

## 2017-01-20 ENCOUNTER — Ambulatory Visit
Admission: RE | Admit: 2017-01-20 | Discharge: 2017-01-20 | Disposition: A | Discharge status: Home / Self Care | Payer: BLUE CROSS/BLUE SHIELD | Source: Ambulatory Visit | Attending: Radiation Oncology | Admitting: Radiation Oncology

## 2017-01-20 DIAGNOSIS — Z510 Encounter for antineoplastic radiation therapy: Secondary | ICD-10-CM | POA: Diagnosis not present

## 2017-01-20 DIAGNOSIS — F419 Anxiety disorder, unspecified: Secondary | ICD-10-CM | POA: Diagnosis not present

## 2017-01-20 DIAGNOSIS — Z888 Allergy status to other drugs, medicaments and biological substances status: Secondary | ICD-10-CM | POA: Diagnosis not present

## 2017-01-20 DIAGNOSIS — G629 Polyneuropathy, unspecified: Secondary | ICD-10-CM | POA: Diagnosis not present

## 2017-01-20 DIAGNOSIS — C773 Secondary and unspecified malignant neoplasm of axilla and upper limb lymph nodes: Secondary | ICD-10-CM | POA: Diagnosis not present

## 2017-01-20 DIAGNOSIS — R110 Nausea: Secondary | ICD-10-CM | POA: Diagnosis not present

## 2017-01-20 DIAGNOSIS — I10 Essential (primary) hypertension: Secondary | ICD-10-CM | POA: Diagnosis not present

## 2017-01-20 DIAGNOSIS — Z803 Family history of malignant neoplasm of breast: Secondary | ICD-10-CM | POA: Diagnosis not present

## 2017-01-20 DIAGNOSIS — Z170 Estrogen receptor positive status [ER+]: Secondary | ICD-10-CM | POA: Diagnosis not present

## 2017-01-20 DIAGNOSIS — M1990 Unspecified osteoarthritis, unspecified site: Secondary | ICD-10-CM | POA: Diagnosis not present

## 2017-01-20 DIAGNOSIS — Z6832 Body mass index (BMI) 32.0-32.9, adult: Secondary | ICD-10-CM | POA: Diagnosis not present

## 2017-01-20 DIAGNOSIS — E785 Hyperlipidemia, unspecified: Secondary | ICD-10-CM | POA: Diagnosis not present

## 2017-01-20 DIAGNOSIS — E669 Obesity, unspecified: Secondary | ICD-10-CM | POA: Diagnosis not present

## 2017-01-20 DIAGNOSIS — R7303 Prediabetes: Secondary | ICD-10-CM | POA: Diagnosis not present

## 2017-01-20 DIAGNOSIS — Z79899 Other long term (current) drug therapy: Secondary | ICD-10-CM | POA: Diagnosis not present

## 2017-01-20 DIAGNOSIS — C50412 Malignant neoplasm of upper-outer quadrant of left female breast: Secondary | ICD-10-CM | POA: Diagnosis not present

## 2017-01-23 ENCOUNTER — Telehealth: Payer: Self-pay | Admitting: Oncology

## 2017-01-23 ENCOUNTER — Ambulatory Visit: Payer: BLUE CROSS/BLUE SHIELD

## 2017-01-23 ENCOUNTER — Ambulatory Visit
Admission: RE | Admit: 2017-01-23 | Discharge: 2017-01-23 | Disposition: A | Discharge status: Home / Self Care | Payer: BLUE CROSS/BLUE SHIELD | Source: Ambulatory Visit | Attending: Radiation Oncology | Admitting: Radiation Oncology

## 2017-01-23 DIAGNOSIS — M1990 Unspecified osteoarthritis, unspecified site: Secondary | ICD-10-CM | POA: Diagnosis not present

## 2017-01-23 DIAGNOSIS — C50412 Malignant neoplasm of upper-outer quadrant of left female breast: Secondary | ICD-10-CM | POA: Diagnosis not present

## 2017-01-23 DIAGNOSIS — E669 Obesity, unspecified: Secondary | ICD-10-CM | POA: Diagnosis not present

## 2017-01-23 DIAGNOSIS — E785 Hyperlipidemia, unspecified: Secondary | ICD-10-CM | POA: Diagnosis not present

## 2017-01-23 DIAGNOSIS — R7303 Prediabetes: Secondary | ICD-10-CM | POA: Diagnosis not present

## 2017-01-23 DIAGNOSIS — Z803 Family history of malignant neoplasm of breast: Secondary | ICD-10-CM | POA: Diagnosis not present

## 2017-01-23 DIAGNOSIS — Z79899 Other long term (current) drug therapy: Secondary | ICD-10-CM | POA: Diagnosis not present

## 2017-01-23 DIAGNOSIS — R110 Nausea: Secondary | ICD-10-CM | POA: Diagnosis not present

## 2017-01-23 DIAGNOSIS — I10 Essential (primary) hypertension: Secondary | ICD-10-CM | POA: Diagnosis not present

## 2017-01-23 DIAGNOSIS — Z888 Allergy status to other drugs, medicaments and biological substances status: Secondary | ICD-10-CM | POA: Diagnosis not present

## 2017-01-23 DIAGNOSIS — Z170 Estrogen receptor positive status [ER+]: Secondary | ICD-10-CM | POA: Diagnosis not present

## 2017-01-23 DIAGNOSIS — F419 Anxiety disorder, unspecified: Secondary | ICD-10-CM | POA: Diagnosis not present

## 2017-01-23 DIAGNOSIS — Z6832 Body mass index (BMI) 32.0-32.9, adult: Secondary | ICD-10-CM | POA: Diagnosis not present

## 2017-01-23 DIAGNOSIS — C773 Secondary and unspecified malignant neoplasm of axilla and upper limb lymph nodes: Secondary | ICD-10-CM | POA: Diagnosis not present

## 2017-01-23 DIAGNOSIS — G629 Polyneuropathy, unspecified: Secondary | ICD-10-CM | POA: Diagnosis not present

## 2017-01-23 DIAGNOSIS — Z510 Encounter for antineoplastic radiation therapy: Secondary | ICD-10-CM | POA: Diagnosis not present

## 2017-01-23 NOTE — Telephone Encounter (Signed)
Scheduled appt per 11/07 los - sent reminder letter in the mail with appt date and time. F/u and labs February 2019

## 2017-01-24 ENCOUNTER — Ambulatory Visit
Admission: RE | Admit: 2017-01-24 | Discharge: 2017-01-24 | Disposition: A | Discharge status: Home / Self Care | Payer: BLUE CROSS/BLUE SHIELD | Source: Ambulatory Visit | Attending: Radiation Oncology | Admitting: Radiation Oncology

## 2017-01-24 ENCOUNTER — Ambulatory Visit: Payer: BLUE CROSS/BLUE SHIELD

## 2017-01-24 DIAGNOSIS — Z888 Allergy status to other drugs, medicaments and biological substances status: Secondary | ICD-10-CM | POA: Diagnosis not present

## 2017-01-24 DIAGNOSIS — Z803 Family history of malignant neoplasm of breast: Secondary | ICD-10-CM | POA: Diagnosis not present

## 2017-01-24 DIAGNOSIS — E785 Hyperlipidemia, unspecified: Secondary | ICD-10-CM | POA: Diagnosis not present

## 2017-01-24 DIAGNOSIS — C773 Secondary and unspecified malignant neoplasm of axilla and upper limb lymph nodes: Secondary | ICD-10-CM | POA: Diagnosis not present

## 2017-01-24 DIAGNOSIS — Z6832 Body mass index (BMI) 32.0-32.9, adult: Secondary | ICD-10-CM | POA: Diagnosis not present

## 2017-01-24 DIAGNOSIS — F419 Anxiety disorder, unspecified: Secondary | ICD-10-CM | POA: Diagnosis not present

## 2017-01-24 DIAGNOSIS — I10 Essential (primary) hypertension: Secondary | ICD-10-CM | POA: Diagnosis not present

## 2017-01-24 DIAGNOSIS — C50412 Malignant neoplasm of upper-outer quadrant of left female breast: Secondary | ICD-10-CM | POA: Diagnosis not present

## 2017-01-24 DIAGNOSIS — R7303 Prediabetes: Secondary | ICD-10-CM | POA: Diagnosis not present

## 2017-01-24 DIAGNOSIS — M1990 Unspecified osteoarthritis, unspecified site: Secondary | ICD-10-CM | POA: Diagnosis not present

## 2017-01-24 DIAGNOSIS — Z510 Encounter for antineoplastic radiation therapy: Secondary | ICD-10-CM | POA: Diagnosis not present

## 2017-01-24 DIAGNOSIS — Z170 Estrogen receptor positive status [ER+]: Secondary | ICD-10-CM | POA: Diagnosis not present

## 2017-01-24 DIAGNOSIS — G629 Polyneuropathy, unspecified: Secondary | ICD-10-CM | POA: Diagnosis not present

## 2017-01-24 DIAGNOSIS — Z79899 Other long term (current) drug therapy: Secondary | ICD-10-CM | POA: Diagnosis not present

## 2017-01-24 DIAGNOSIS — E669 Obesity, unspecified: Secondary | ICD-10-CM | POA: Diagnosis not present

## 2017-01-24 DIAGNOSIS — R110 Nausea: Secondary | ICD-10-CM | POA: Diagnosis not present

## 2017-01-25 ENCOUNTER — Ambulatory Visit
Admission: RE | Admit: 2017-01-25 | Discharge: 2017-01-25 | Disposition: A | Discharge status: Home / Self Care | Payer: BLUE CROSS/BLUE SHIELD | Source: Ambulatory Visit | Attending: Radiation Oncology | Admitting: Radiation Oncology

## 2017-01-25 ENCOUNTER — Ambulatory Visit: Payer: BLUE CROSS/BLUE SHIELD

## 2017-01-25 DIAGNOSIS — R7303 Prediabetes: Secondary | ICD-10-CM | POA: Diagnosis not present

## 2017-01-25 DIAGNOSIS — R110 Nausea: Secondary | ICD-10-CM | POA: Diagnosis not present

## 2017-01-25 DIAGNOSIS — F419 Anxiety disorder, unspecified: Secondary | ICD-10-CM | POA: Diagnosis not present

## 2017-01-25 DIAGNOSIS — Z803 Family history of malignant neoplasm of breast: Secondary | ICD-10-CM | POA: Diagnosis not present

## 2017-01-25 DIAGNOSIS — I10 Essential (primary) hypertension: Secondary | ICD-10-CM | POA: Diagnosis not present

## 2017-01-25 DIAGNOSIS — Z170 Estrogen receptor positive status [ER+]: Secondary | ICD-10-CM | POA: Diagnosis not present

## 2017-01-25 DIAGNOSIS — Z6832 Body mass index (BMI) 32.0-32.9, adult: Secondary | ICD-10-CM | POA: Diagnosis not present

## 2017-01-25 DIAGNOSIS — Z888 Allergy status to other drugs, medicaments and biological substances status: Secondary | ICD-10-CM | POA: Diagnosis not present

## 2017-01-25 DIAGNOSIS — E785 Hyperlipidemia, unspecified: Secondary | ICD-10-CM | POA: Diagnosis not present

## 2017-01-25 DIAGNOSIS — C50412 Malignant neoplasm of upper-outer quadrant of left female breast: Secondary | ICD-10-CM | POA: Diagnosis not present

## 2017-01-25 DIAGNOSIS — Z79899 Other long term (current) drug therapy: Secondary | ICD-10-CM | POA: Diagnosis not present

## 2017-01-25 DIAGNOSIS — E669 Obesity, unspecified: Secondary | ICD-10-CM | POA: Diagnosis not present

## 2017-01-25 DIAGNOSIS — M1990 Unspecified osteoarthritis, unspecified site: Secondary | ICD-10-CM | POA: Diagnosis not present

## 2017-01-25 DIAGNOSIS — C773 Secondary and unspecified malignant neoplasm of axilla and upper limb lymph nodes: Secondary | ICD-10-CM | POA: Diagnosis not present

## 2017-01-25 DIAGNOSIS — G629 Polyneuropathy, unspecified: Secondary | ICD-10-CM | POA: Diagnosis not present

## 2017-01-25 DIAGNOSIS — Z510 Encounter for antineoplastic radiation therapy: Secondary | ICD-10-CM | POA: Diagnosis not present

## 2017-01-26 ENCOUNTER — Ambulatory Visit
Admission: RE | Admit: 2017-01-26 | Discharge: 2017-01-26 | Disposition: A | Discharge status: Home / Self Care | Payer: BLUE CROSS/BLUE SHIELD | Source: Ambulatory Visit | Attending: Radiation Oncology | Admitting: Radiation Oncology

## 2017-01-26 ENCOUNTER — Ambulatory Visit: Payer: BLUE CROSS/BLUE SHIELD

## 2017-01-26 DIAGNOSIS — E669 Obesity, unspecified: Secondary | ICD-10-CM | POA: Diagnosis not present

## 2017-01-26 DIAGNOSIS — C773 Secondary and unspecified malignant neoplasm of axilla and upper limb lymph nodes: Secondary | ICD-10-CM | POA: Diagnosis not present

## 2017-01-26 DIAGNOSIS — C50412 Malignant neoplasm of upper-outer quadrant of left female breast: Secondary | ICD-10-CM | POA: Diagnosis not present

## 2017-01-26 DIAGNOSIS — Z6832 Body mass index (BMI) 32.0-32.9, adult: Secondary | ICD-10-CM | POA: Diagnosis not present

## 2017-01-26 DIAGNOSIS — Z170 Estrogen receptor positive status [ER+]: Secondary | ICD-10-CM | POA: Diagnosis not present

## 2017-01-26 DIAGNOSIS — F419 Anxiety disorder, unspecified: Secondary | ICD-10-CM | POA: Diagnosis not present

## 2017-01-26 DIAGNOSIS — Z510 Encounter for antineoplastic radiation therapy: Secondary | ICD-10-CM | POA: Diagnosis not present

## 2017-01-26 DIAGNOSIS — E785 Hyperlipidemia, unspecified: Secondary | ICD-10-CM | POA: Diagnosis not present

## 2017-01-26 DIAGNOSIS — Z888 Allergy status to other drugs, medicaments and biological substances status: Secondary | ICD-10-CM | POA: Diagnosis not present

## 2017-01-26 DIAGNOSIS — Z803 Family history of malignant neoplasm of breast: Secondary | ICD-10-CM | POA: Diagnosis not present

## 2017-01-26 DIAGNOSIS — M1990 Unspecified osteoarthritis, unspecified site: Secondary | ICD-10-CM | POA: Diagnosis not present

## 2017-01-26 DIAGNOSIS — R7303 Prediabetes: Secondary | ICD-10-CM | POA: Diagnosis not present

## 2017-01-26 DIAGNOSIS — Z79899 Other long term (current) drug therapy: Secondary | ICD-10-CM | POA: Diagnosis not present

## 2017-01-26 DIAGNOSIS — I10 Essential (primary) hypertension: Secondary | ICD-10-CM | POA: Diagnosis not present

## 2017-01-26 DIAGNOSIS — R110 Nausea: Secondary | ICD-10-CM | POA: Diagnosis not present

## 2017-01-26 DIAGNOSIS — G629 Polyneuropathy, unspecified: Secondary | ICD-10-CM | POA: Diagnosis not present

## 2017-01-27 ENCOUNTER — Ambulatory Visit
Admission: RE | Admit: 2017-01-27 | Discharge: 2017-01-27 | Disposition: A | Discharge status: Home / Self Care | Payer: BLUE CROSS/BLUE SHIELD | Source: Ambulatory Visit | Attending: Radiation Oncology | Admitting: Radiation Oncology

## 2017-01-27 ENCOUNTER — Ambulatory Visit: Payer: BLUE CROSS/BLUE SHIELD

## 2017-01-27 DIAGNOSIS — M1990 Unspecified osteoarthritis, unspecified site: Secondary | ICD-10-CM | POA: Diagnosis not present

## 2017-01-27 DIAGNOSIS — C50412 Malignant neoplasm of upper-outer quadrant of left female breast: Secondary | ICD-10-CM | POA: Diagnosis not present

## 2017-01-27 DIAGNOSIS — F419 Anxiety disorder, unspecified: Secondary | ICD-10-CM | POA: Diagnosis not present

## 2017-01-27 DIAGNOSIS — I10 Essential (primary) hypertension: Secondary | ICD-10-CM | POA: Diagnosis not present

## 2017-01-27 DIAGNOSIS — R7303 Prediabetes: Secondary | ICD-10-CM | POA: Diagnosis not present

## 2017-01-27 DIAGNOSIS — Z803 Family history of malignant neoplasm of breast: Secondary | ICD-10-CM | POA: Diagnosis not present

## 2017-01-27 DIAGNOSIS — G629 Polyneuropathy, unspecified: Secondary | ICD-10-CM | POA: Diagnosis not present

## 2017-01-27 DIAGNOSIS — R110 Nausea: Secondary | ICD-10-CM | POA: Diagnosis not present

## 2017-01-27 DIAGNOSIS — Z888 Allergy status to other drugs, medicaments and biological substances status: Secondary | ICD-10-CM | POA: Diagnosis not present

## 2017-01-27 DIAGNOSIS — Z6832 Body mass index (BMI) 32.0-32.9, adult: Secondary | ICD-10-CM | POA: Diagnosis not present

## 2017-01-27 DIAGNOSIS — Z79899 Other long term (current) drug therapy: Secondary | ICD-10-CM | POA: Diagnosis not present

## 2017-01-27 DIAGNOSIS — Z170 Estrogen receptor positive status [ER+]: Secondary | ICD-10-CM | POA: Diagnosis not present

## 2017-01-27 DIAGNOSIS — E785 Hyperlipidemia, unspecified: Secondary | ICD-10-CM | POA: Diagnosis not present

## 2017-01-27 DIAGNOSIS — E669 Obesity, unspecified: Secondary | ICD-10-CM | POA: Diagnosis not present

## 2017-01-27 DIAGNOSIS — Z510 Encounter for antineoplastic radiation therapy: Secondary | ICD-10-CM | POA: Diagnosis not present

## 2017-01-27 DIAGNOSIS — C773 Secondary and unspecified malignant neoplasm of axilla and upper limb lymph nodes: Secondary | ICD-10-CM | POA: Diagnosis not present

## 2017-01-30 ENCOUNTER — Ambulatory Visit
Admission: RE | Admit: 2017-01-30 | Discharge: 2017-01-30 | Disposition: A | Discharge status: Home / Self Care | Payer: BLUE CROSS/BLUE SHIELD | Source: Ambulatory Visit | Attending: Radiation Oncology | Admitting: Radiation Oncology

## 2017-01-30 ENCOUNTER — Ambulatory Visit: Payer: BLUE CROSS/BLUE SHIELD

## 2017-01-30 DIAGNOSIS — G629 Polyneuropathy, unspecified: Secondary | ICD-10-CM | POA: Diagnosis not present

## 2017-01-30 DIAGNOSIS — M1990 Unspecified osteoarthritis, unspecified site: Secondary | ICD-10-CM | POA: Diagnosis not present

## 2017-01-30 DIAGNOSIS — C50412 Malignant neoplasm of upper-outer quadrant of left female breast: Secondary | ICD-10-CM | POA: Diagnosis not present

## 2017-01-30 DIAGNOSIS — E785 Hyperlipidemia, unspecified: Secondary | ICD-10-CM | POA: Diagnosis not present

## 2017-01-30 DIAGNOSIS — E669 Obesity, unspecified: Secondary | ICD-10-CM | POA: Diagnosis not present

## 2017-01-30 DIAGNOSIS — Z6832 Body mass index (BMI) 32.0-32.9, adult: Secondary | ICD-10-CM | POA: Diagnosis not present

## 2017-01-30 DIAGNOSIS — R7303 Prediabetes: Secondary | ICD-10-CM | POA: Diagnosis not present

## 2017-01-30 DIAGNOSIS — Z510 Encounter for antineoplastic radiation therapy: Secondary | ICD-10-CM | POA: Diagnosis not present

## 2017-01-30 DIAGNOSIS — Z803 Family history of malignant neoplasm of breast: Secondary | ICD-10-CM | POA: Diagnosis not present

## 2017-01-30 DIAGNOSIS — C773 Secondary and unspecified malignant neoplasm of axilla and upper limb lymph nodes: Secondary | ICD-10-CM | POA: Diagnosis not present

## 2017-01-30 DIAGNOSIS — Z170 Estrogen receptor positive status [ER+]: Secondary | ICD-10-CM | POA: Diagnosis not present

## 2017-01-30 DIAGNOSIS — Z79899 Other long term (current) drug therapy: Secondary | ICD-10-CM | POA: Diagnosis not present

## 2017-01-30 DIAGNOSIS — Z888 Allergy status to other drugs, medicaments and biological substances status: Secondary | ICD-10-CM | POA: Diagnosis not present

## 2017-01-30 DIAGNOSIS — F419 Anxiety disorder, unspecified: Secondary | ICD-10-CM | POA: Diagnosis not present

## 2017-01-30 DIAGNOSIS — I10 Essential (primary) hypertension: Secondary | ICD-10-CM | POA: Diagnosis not present

## 2017-01-30 DIAGNOSIS — R110 Nausea: Secondary | ICD-10-CM | POA: Diagnosis not present

## 2017-01-31 ENCOUNTER — Ambulatory Visit: Payer: BLUE CROSS/BLUE SHIELD

## 2017-01-31 ENCOUNTER — Ambulatory Visit
Admission: RE | Admit: 2017-01-31 | Discharge: 2017-01-31 | Disposition: A | Discharge status: Home / Self Care | Payer: BLUE CROSS/BLUE SHIELD | Source: Ambulatory Visit | Attending: Radiation Oncology | Admitting: Radiation Oncology

## 2017-01-31 DIAGNOSIS — Z803 Family history of malignant neoplasm of breast: Secondary | ICD-10-CM | POA: Diagnosis not present

## 2017-01-31 DIAGNOSIS — F419 Anxiety disorder, unspecified: Secondary | ICD-10-CM | POA: Diagnosis not present

## 2017-01-31 DIAGNOSIS — I10 Essential (primary) hypertension: Secondary | ICD-10-CM | POA: Diagnosis not present

## 2017-01-31 DIAGNOSIS — Z170 Estrogen receptor positive status [ER+]: Secondary | ICD-10-CM | POA: Diagnosis not present

## 2017-01-31 DIAGNOSIS — Z510 Encounter for antineoplastic radiation therapy: Secondary | ICD-10-CM | POA: Diagnosis not present

## 2017-01-31 DIAGNOSIS — Z888 Allergy status to other drugs, medicaments and biological substances status: Secondary | ICD-10-CM | POA: Diagnosis not present

## 2017-01-31 DIAGNOSIS — Z79899 Other long term (current) drug therapy: Secondary | ICD-10-CM | POA: Diagnosis not present

## 2017-01-31 DIAGNOSIS — R7303 Prediabetes: Secondary | ICD-10-CM | POA: Diagnosis not present

## 2017-01-31 DIAGNOSIS — E669 Obesity, unspecified: Secondary | ICD-10-CM | POA: Diagnosis not present

## 2017-01-31 DIAGNOSIS — C50412 Malignant neoplasm of upper-outer quadrant of left female breast: Secondary | ICD-10-CM | POA: Diagnosis not present

## 2017-01-31 DIAGNOSIS — Z6832 Body mass index (BMI) 32.0-32.9, adult: Secondary | ICD-10-CM | POA: Diagnosis not present

## 2017-01-31 DIAGNOSIS — C773 Secondary and unspecified malignant neoplasm of axilla and upper limb lymph nodes: Secondary | ICD-10-CM | POA: Diagnosis not present

## 2017-01-31 DIAGNOSIS — M1990 Unspecified osteoarthritis, unspecified site: Secondary | ICD-10-CM | POA: Diagnosis not present

## 2017-01-31 DIAGNOSIS — R110 Nausea: Secondary | ICD-10-CM | POA: Diagnosis not present

## 2017-01-31 DIAGNOSIS — G629 Polyneuropathy, unspecified: Secondary | ICD-10-CM | POA: Diagnosis not present

## 2017-01-31 DIAGNOSIS — E785 Hyperlipidemia, unspecified: Secondary | ICD-10-CM | POA: Diagnosis not present

## 2017-01-31 MED ORDER — RADIAPLEXRX EX GEL
Freq: Once | CUTANEOUS | Status: AC
  Administered 2017-01-31: 15:00:00 via TOPICAL

## 2017-02-01 ENCOUNTER — Ambulatory Visit: Payer: BLUE CROSS/BLUE SHIELD

## 2017-02-01 ENCOUNTER — Encounter: Payer: Self-pay | Admitting: Radiation Oncology

## 2017-02-01 ENCOUNTER — Ambulatory Visit
Admission: RE | Admit: 2017-02-01 | Discharge: 2017-02-01 | Disposition: A | Discharge status: Home / Self Care | Payer: BLUE CROSS/BLUE SHIELD | Source: Ambulatory Visit | Attending: Radiation Oncology | Admitting: Radiation Oncology

## 2017-02-01 DIAGNOSIS — Z510 Encounter for antineoplastic radiation therapy: Secondary | ICD-10-CM | POA: Diagnosis not present

## 2017-02-01 DIAGNOSIS — R7303 Prediabetes: Secondary | ICD-10-CM | POA: Diagnosis not present

## 2017-02-01 DIAGNOSIS — E669 Obesity, unspecified: Secondary | ICD-10-CM | POA: Diagnosis not present

## 2017-02-01 DIAGNOSIS — M1990 Unspecified osteoarthritis, unspecified site: Secondary | ICD-10-CM | POA: Diagnosis not present

## 2017-02-01 DIAGNOSIS — Z803 Family history of malignant neoplasm of breast: Secondary | ICD-10-CM | POA: Diagnosis not present

## 2017-02-01 DIAGNOSIS — R110 Nausea: Secondary | ICD-10-CM | POA: Diagnosis not present

## 2017-02-01 DIAGNOSIS — Z888 Allergy status to other drugs, medicaments and biological substances status: Secondary | ICD-10-CM | POA: Diagnosis not present

## 2017-02-01 DIAGNOSIS — I10 Essential (primary) hypertension: Secondary | ICD-10-CM | POA: Diagnosis not present

## 2017-02-01 DIAGNOSIS — F419 Anxiety disorder, unspecified: Secondary | ICD-10-CM | POA: Diagnosis not present

## 2017-02-01 DIAGNOSIS — Z170 Estrogen receptor positive status [ER+]: Secondary | ICD-10-CM | POA: Diagnosis not present

## 2017-02-01 DIAGNOSIS — C773 Secondary and unspecified malignant neoplasm of axilla and upper limb lymph nodes: Secondary | ICD-10-CM | POA: Diagnosis not present

## 2017-02-01 DIAGNOSIS — C50412 Malignant neoplasm of upper-outer quadrant of left female breast: Secondary | ICD-10-CM | POA: Diagnosis not present

## 2017-02-01 DIAGNOSIS — Z79899 Other long term (current) drug therapy: Secondary | ICD-10-CM | POA: Diagnosis not present

## 2017-02-01 DIAGNOSIS — Z6832 Body mass index (BMI) 32.0-32.9, adult: Secondary | ICD-10-CM | POA: Diagnosis not present

## 2017-02-01 DIAGNOSIS — E785 Hyperlipidemia, unspecified: Secondary | ICD-10-CM | POA: Diagnosis not present

## 2017-02-01 DIAGNOSIS — G629 Polyneuropathy, unspecified: Secondary | ICD-10-CM | POA: Diagnosis not present

## 2017-02-01 MED ORDER — RADIAPLEXRX EX GEL
Freq: Two times a day (BID) | CUTANEOUS | Status: DC
  Administered 2017-02-01: 14:00:00 via TOPICAL

## 2017-02-06 ENCOUNTER — Other Ambulatory Visit: Payer: Self-pay | Admitting: Family Medicine

## 2017-02-06 ENCOUNTER — Encounter: Payer: Self-pay | Admitting: Family Medicine

## 2017-02-06 NOTE — Progress Notes (Signed)
  Radiation Oncology         [phone] ________________________________  Name: Tanya [name] MRN: [id number]  Date: 02/01/2017  DOB: 10/15/1955  End of Treatment Note  Diagnosis:   Left-sided breast cancer     Indication for treatment:  Curative       Radiation treatment dates:   12/19/2016 - 02/01/2017  Site/dose:   The patient initially received a dose of 50.4 Gy in 28 fractions to the left breast and left supraclavicular using whole-breast tangent fields. This was delivered using a 3-D conformal technique. The patient then received a boost to the seroma. This delivered an additional 10 Gy in 5 fractions using a 3-D technique. The total dose was 60.4 Gy.  Narrative: The patient tolerated radiation treatment relatively well.   The patient had some expected skin irritation as she progressed during treatment. Moist desquamation was not present at the end of treatment.  Plan: The patient has completed radiation treatment. The patient will return to radiation oncology clinic for routine followup in one month. I advised the patient to call or return sooner if they have any questions or concerns related to their recovery or treatment. ________________________________  Jodelle Gross, MD, PhD  This document serves as a record of services personally performed by Kyung Rudd, MD. It was created on his behalf by Rae Lips, a trained medical scribe. The creation of this record is based on the scribe's personal observations and the provider's statements to them. This document has been checked and approved by the attending provider.

## 2017-02-10 ENCOUNTER — Ambulatory Visit (INDEPENDENT_AMBULATORY_CARE_PROVIDER_SITE_OTHER): Payer: BLUE CROSS/BLUE SHIELD | Admitting: *Deleted

## 2017-02-10 DIAGNOSIS — Z23 Encounter for immunization: Secondary | ICD-10-CM | POA: Diagnosis not present

## 2017-02-10 DIAGNOSIS — Z853 Personal history of malignant neoplasm of breast: Secondary | ICD-10-CM | POA: Diagnosis not present

## 2017-02-14 DIAGNOSIS — H2513 Age-related nuclear cataract, bilateral: Secondary | ICD-10-CM | POA: Diagnosis not present

## 2017-02-21 ENCOUNTER — Ambulatory Visit: Payer: BLUE CROSS/BLUE SHIELD | Admitting: Student in an Organized Health Care Education/Training Program

## 2017-02-28 ENCOUNTER — Ambulatory Visit: Payer: BLUE CROSS/BLUE SHIELD | Admitting: Student in an Organized Health Care Education/Training Program

## 2017-03-09 DIAGNOSIS — C50912 Malignant neoplasm of unspecified site of left female breast: Secondary | ICD-10-CM | POA: Diagnosis not present

## 2017-03-21 ENCOUNTER — Ambulatory Visit
Admission: RE | Admit: 2017-03-21 | Discharge: 2017-03-21 | Disposition: A | Discharge status: Home / Self Care | Payer: BLUE CROSS/BLUE SHIELD | Source: Ambulatory Visit | Attending: Radiation Oncology | Admitting: Radiation Oncology

## 2017-03-21 ENCOUNTER — Encounter: Payer: Self-pay | Admitting: Radiation Oncology

## 2017-03-21 ENCOUNTER — Other Ambulatory Visit: Payer: Self-pay

## 2017-03-21 VITALS — BP 134/87 | HR 98 | Temp 98.4°F | Resp 20 | Wt 191.4 lb

## 2017-03-21 DIAGNOSIS — R05 Cough: Secondary | ICD-10-CM | POA: Insufficient documentation

## 2017-03-21 DIAGNOSIS — C50912 Malignant neoplasm of unspecified site of left female breast: Secondary | ICD-10-CM | POA: Insufficient documentation

## 2017-03-21 DIAGNOSIS — Z79811 Long term (current) use of aromatase inhibitors: Secondary | ICD-10-CM | POA: Insufficient documentation

## 2017-03-21 DIAGNOSIS — Z79891 Long term (current) use of opiate analgesic: Secondary | ICD-10-CM | POA: Diagnosis not present

## 2017-03-21 DIAGNOSIS — Z923 Personal history of irradiation: Secondary | ICD-10-CM | POA: Diagnosis not present

## 2017-03-21 DIAGNOSIS — N644 Mastodynia: Secondary | ICD-10-CM | POA: Diagnosis not present

## 2017-03-21 DIAGNOSIS — B3789 Other sites of candidiasis: Secondary | ICD-10-CM | POA: Insufficient documentation

## 2017-03-21 DIAGNOSIS — R42 Dizziness and giddiness: Secondary | ICD-10-CM | POA: Insufficient documentation

## 2017-03-21 DIAGNOSIS — Z79899 Other long term (current) drug therapy: Secondary | ICD-10-CM | POA: Insufficient documentation

## 2017-03-21 DIAGNOSIS — R21 Rash and other nonspecific skin eruption: Secondary | ICD-10-CM | POA: Insufficient documentation

## 2017-03-21 DIAGNOSIS — Z888 Allergy status to other drugs, medicaments and biological substances status: Secondary | ICD-10-CM | POA: Diagnosis not present

## 2017-03-21 DIAGNOSIS — Y842 Radiological procedure and radiotherapy as the cause of abnormal reaction of the patient, or of later complication, without mention of misadventure at the time of the procedure: Secondary | ICD-10-CM | POA: Diagnosis not present

## 2017-03-21 DIAGNOSIS — Z170 Estrogen receptor positive status [ER+]: Secondary | ICD-10-CM | POA: Diagnosis not present

## 2017-03-21 DIAGNOSIS — C50412 Malignant neoplasm of upper-outer quadrant of left female breast: Secondary | ICD-10-CM

## 2017-03-21 MED ORDER — NYSTATIN 100000 UNIT/GM EX CREA: 1.0000 "application " | TOPICAL_CREAM | Freq: Three times a day (TID) | CUTANEOUS | 1 refills | Status: DC

## 2017-03-21 NOTE — Progress Notes (Addendum)
Radiation Oncology         [phone] ________________________________  Name: Tanya [name] MRN: [id number]  Date of Service: 03/21/2017  DOB: 1955-09-10  Post Treatment Note  CC: Everrett Coombe, MD  Rolm Bookbinder, MD  Diagnosis:   Stage IIB, cT1cN1M0G3, ER/PR positive, grade 3, invasive ductal carcinoma of the left breast  Interval Since Last Radiation: 7 weeks   12/19/2016 - 02/01/2017: The patient initially received a dose of 50.4 Gy in 28 fractions to the left breast and left supraclavicular using whole-breast tangent fields. This was delivered using a 3-D conformal technique. The patient then received a boost to the seroma. This delivered an additional 10 Gy in 5 fractions using a 3-D technique. The total dose was 60.4 Gy.   Narrative:  The patient returns today for routine follow-up. During treatment she did very well with radiotherapy and did have mild desquamation.                            On review of systems, the patient states she is doing well overall though. She reports she has had some concerns ongoing in the right breast for the last few months. She describes sharp shooting discomfort from time to time without any predictability. She denies any palpable mass, nipple bleeding or discharge. She reports she's had a rash beneath both inframammary folds. She has been using OTC triple antibiotic ointment. She reports she has had several episodes as well of dizziness after exercise and sometimes unrelated to activity. She has a way of checking her blood pressure, but has not checked this during the times she has been dizzy. She reports intermittent cough as well since beginning anastrozole. No other complaints are noted.   ALLERGIES:  is allergic to ascorbic acid & derivatives and camoquin [amodiaquine].  Meds: Current Outpatient Medications  Medication Sig Dispense Refill  . anastrozole (ARIMIDEX) 1 MG tablet   3  . losartan (COZAAR) 50 MG tablet TAKE 1 TABLET (50 MG  TOTAL) BY MOUTH DAILY. 90 tablet 2  . Multiple Vitamin (MULTIVITAMIN WITH MINERALS) TABS tablet Take 1 tablet by mouth 3 (three) times a week. One-A-Day Women's 50+    . non-metallic deodorant (ALRA) MISC Apply 1 application topically.    . pravastatin (PRAVACHOL) 40 MG tablet TAKE 1 TABLET (40 MG TOTAL) BY MOUTH AT BEDTIME. 90 tablet 2  . gabapentin (NEURONTIN) 100 MG capsule TAKE 1 CAPSULE BY MOUTH THREE TIMES A DAY (Patient not taking: Reported on 03/21/2017) 90 capsule 0  . methocarbamol (ROBAXIN) 750 MG tablet Take 1 tablet (750 mg total) by mouth 4 (four) times daily as needed (use for muscle cramps/pain). (Patient not taking: Reported on 11/24/2016) 15 tablet 0  . neomycin-bacitracin-polymyxin (NEOSPORIN) ointment Apply 1 application topically 2 (two) times daily as needed (for boil wound care).    . nystatin cream (MYCOSTATIN) Apply 1 application topically 3 (three) times daily. 30 g 1  . omeprazole (PRILOSEC) 40 MG capsule Take 1 capsule (40 mg total) by mouth daily. (Patient not taking: Reported on 10/21/2016) 90 capsule 3  . oxyCODONE (OXY IR/ROXICODONE) 5 MG immediate release tablet Take 1 tablet (5 mg total) by mouth every 6 (six) hours as needed for moderate pain, severe pain or breakthrough pain. (Patient not taking: Reported on 11/24/2016) 15 tablet 0  . prochlorperazine (COMPAZINE) 10 MG tablet Take 1 tablet (10 mg total) by mouth every 6 (six) hours as needed (Nausea or vomiting). (Patient not  taking: Reported on 10/21/2016) 30 tablet 1  . simethicone (MYLICON) 80 MG chewable tablet Chew 80 mg by mouth every 6 (six) hours as needed (for gas).      No current facility-administered medications for this encounter.     Physical Findings:  weight is 191 lb 6 oz (86.8 kg). Her oral temperature is 98.4 F (36.9 C). Her blood pressure is 134/87 and her pulse is 98. Her respiration is 20 and oxygen saturation is 100%.  Pain Assessment Pain Score: 2  Pain Loc: Breast/10 In general this is a  well appearing African American female in no acute distress. She's alert and oriented x4 and appropriate throughout the examination. Cardiopulmonary assessment is negative for acute distress and she exhibits normal effort. The left breast was examined and reveals mild hyperpigmentation at the left supraclavicular and axillary sites. The breast is somewhat tan as well. There is no palpable mass but dense breast tissue in the right breast, no nipple bleeding or discharge is noted, and no skin abnormalities are noted. Bilateral inframammary folds have shiny appearing erythematous change consistent with cutaneous candida. Chest is clear to auscultation bilateral and HEENT reveals that she is normocephalic, atraumatic. EOMS are intact bilaterally with PERRLA. Her posterior oropharynx is intact with mild injection.     Lab Findings: Lab Results  Component Value Date   WBC 4.0 01/18/2017   HGB 12.5 01/18/2017   HCT 39.5 01/18/2017   MCV 77.1 (L) 01/18/2017   PLT 164 01/18/2017     Radiographic Findings: No results found.  Impression/Plan: 1. Stage IIB, cT1cN1M0G3, ER/PR positive, grade 3, invasive ductal carcinoma of the left breast. The patient has been doing well since completion of radiotherapy. We discussed that we would be happy to continue to follow her as needed, but she will also continue Anastrozole and will follow up with Dr. Jana Hakim in medical oncology. She was counseled on skin care as well as measures to avoid sun exposure to this area.  2. Survivorship. Survivorship was discussed with the patient and she will proceed with this appointment once referred by medical oncology.  3. Cough. The patient has findings consistent with viral URI. We discussed increasing her oral intake of fluids, decongestants OTC and using a humidifier. She is in agreement. 4. Dizziness. The patient will follow up with her PCP and notate her BP when this occurs. She will keep [place] informed as well. I let her know that  I was not suspicious of any breast cancer related issues, but there is a 5-8% report of dizziness in the package insert for Anastrozole. 5. Right breast pain. The patient does not have any examination findings of concern. I will check with Dr. Jana Hakim to see if he recommends imaging, however her breast on MRI in August following her chemotherapy did not reveal any abnormalities or enhancement. She is in agreement with this plan. 6. Cutaneous candida. The patient has clinical findings of yeast beneath her breasts. I have called in a prescription for topical nystatin cream to her pharmacy.     Carola Rhine, PAC

## 2017-03-31 ENCOUNTER — Other Ambulatory Visit: Payer: Self-pay | Admitting: *Deleted

## 2017-04-04 DIAGNOSIS — C50412 Malignant neoplasm of upper-outer quadrant of left female breast: Secondary | ICD-10-CM | POA: Diagnosis not present

## 2017-04-10 ENCOUNTER — Telehealth: Payer: Self-pay | Admitting: *Deleted

## 2017-04-10 NOTE — Telephone Encounter (Signed)
This RN spoke with pt per her call due to onset of pain in her back - pain occurred suddenly post bending over " and then I had a hard time walking ".  She has Robaxin in home from a prior procedure and is asking if she may take it with current medication- anastrozole.  This RN informed Kamarah yes- and to use ice on back vs heat.  She will implement above and call if she needs further advice.

## 2017-04-13 ENCOUNTER — Telehealth: Payer: Self-pay | Admitting: *Deleted

## 2017-04-13 NOTE — Telephone Encounter (Signed)
This RN spoke with pt today per her call to [facility] with continued back pain with difficulty ambulating not relieved with Robaxin ( see prior call this week ).  Arthella says she is having continued pain with difficulty walking post sudden onset of back pain - she wants to know what to do now.  This RN informed pt she needs to be seen by MD/provider ASAP so concern can be assessed better for appropriate recommendation - she had contacted her primary MD who scheduled her for an appointment later in Feb - this RN informed pt she needs to be seen ideally today - and offered for her to come in the [facility] for assessment ( though I explained her pain likely is not cancer related due to sudden onset post an activity )  She may need a scan for further evaluation for appropriate therapy.  Vicie states she will try to arrange transportation and return call to this RN.  Call returned with Chrys Racer stating she is unable to arrange transportation and has contacted her primary MD and stated urgency of concern to his nurse - and she is awaiting a return call from them.  No further needs from this office at this time. Terina stated appreciation of call and recommendations.

## 2017-04-14 ENCOUNTER — Other Ambulatory Visit: Payer: Self-pay

## 2017-04-14 ENCOUNTER — Encounter: Payer: Self-pay | Admitting: Internal Medicine

## 2017-04-14 ENCOUNTER — Ambulatory Visit: Payer: BLUE CROSS/BLUE SHIELD | Admitting: Internal Medicine

## 2017-04-14 VITALS — BP 120/74 | HR 98 | Temp 99.1°F | Ht 63.0 in | Wt 195.0 lb

## 2017-04-14 DIAGNOSIS — S335XXA Sprain of ligaments of lumbar spine, initial encounter: Secondary | ICD-10-CM

## 2017-04-14 MED ORDER — NAPROXEN 500 MG PO TABS: 500.0000 mg | ORAL_TABLET | Freq: Two times a day (BID) | ORAL | 0 refills | Status: DC

## 2017-04-14 MED ORDER — DOCUSATE SODIUM 100 MG PO CAPS: 100.0000 mg | ORAL_CAPSULE | Freq: Two times a day (BID) | ORAL | 0 refills | Status: DC

## 2017-04-14 NOTE — Progress Notes (Signed)
   [facility] Phone: [phone]   Date of Visit: 04/14/2017   HPI:  Low Back Pain:  - patient reports of acute left lower back pain which started on Sunday when she was trying to get out of the car - pain is achy and does not radiate.  - no lower extremity weakness, no saddle anesthesia, no urinary retention, no bowel incontinence  - she had Robaxin from a prior visit but this did not help - she tried Ibuprofen 400mg  which helped for a few hours  - she has been doing some stretching which has helped her a lot - symptoms are slowly improving   ROS: See HPI.  [place]:  PMH: HTN HLD Seasonal Allergies Prediabetes Obesity  Anxiety  Breast Cancer  PHYSICAL EXAM: BP 120/74   Pulse 98   Temp 99.1 F (37.3 C)   Ht 5\' 3"  (1.6 m)   Wt 195 lb (88.5 kg)   SpO2 96%   BMI 34.54 kg/m  GEN: NAD CV: RRR, no murmurs, rubs, or gallops PULM: CTAB, normal effort MSK: no midline spinal tenderness. Normal range of motion of the lumbar back. Discomfort to palpation of the left lumbar paraspinal muscles. No SI joint tenderness. Lower extremity strength normal bilaterally. Normal sensation to light touch in lower extremities bilaterally.  SKIN: No rash or cyanosis; warm and well-perfused EXTR: No lower extremity edema or calf tenderness PSYCH: Mood and affect euthymic, normal rate and volume of speech NEURO: Awake, alert, no focal deficits grossly, normal speech   ASSESSMENT/PLAN:  1. Sprain of low back, initial encounter No red flags on history or exam. Naproxen 500mg  BID PRN with food. Back stretching exercises. Follow up in 4 weeks if no improvement or sooner if symptoms worsen.   Smiley Houseman, MD PGY [place]

## 2017-04-14 NOTE — Patient Instructions (Signed)
You can take Naproxen twice a day as needed for Pain. FOllow up in 4 weeks if your symptoms do not improve or sooner if they worsen.   Low Back Sprain Rehab Ask your health care provider which exercises are safe for you. Do exercises exactly as told by your health care provider and adjust them as directed. It is normal to feel mild stretching, pulling, tightness, or discomfort as you do these exercises, but you should stop right away if you feel sudden pain or your pain gets worse. Do not begin these exercises until told by your health care provider. Stretching and range of motion exercises These exercises warm up your muscles and joints and improve the movement and flexibility of your back. These exercises also help to relieve pain, numbness, and tingling. Exercise A: Lumbar rotation  1. Lie on your back on a firm surface and bend your knees. 2. Straighten your arms out to your sides so each arm forms an "L" shape with a side of your body (a 90 degree angle). 3. Slowly move both of your knees to one side of your body until you feel a stretch in your lower back. Try not to let your shoulders move off of the floor. 4. Hold for __________ seconds. 5. Tense your abdominal muscles and slowly move your knees back to the starting position. 6. Repeat this exercise on the other side of your body. Repeat __________ times. Complete this exercise __________ times a day. Exercise B: Prone extension on elbows  1. Lie on your abdomen on a firm surface. 2. Prop yourself up on your elbows. 3. Use your arms to help lift your chest up until you feel a gentle stretch in your abdomen and your lower back. ? This will place some of your body weight on your elbows. If this is uncomfortable, try stacking pillows under your chest. ? Your hips should stay down, against the surface that you are lying on. Keep your hip and back muscles relaxed. 4. Hold for __________ seconds. 5. Slowly relax your upper body and return to  the starting position. Repeat __________ times. Complete this exercise __________ times a day. Strengthening exercises These exercises build strength and endurance in your back. Endurance is the ability to use your muscles for a long time, even after they get tired. Exercise C: Pelvic tilt 1. Lie on your back on a firm surface. Bend your knees and keep your feet flat. 2. Tense your abdominal muscles. Tip your pelvis up toward the ceiling and flatten your lower back into the floor. ? To help with this exercise, you may place a small towel under your lower back and try to push your back into the towel. 3. Hold for __________ seconds. 4. Let your muscles relax completely before you repeat this exercise. Repeat __________ times. Complete this exercise __________ times a day. Exercise D: Alternating arm and leg raises  1. Get on your hands and knees on a firm surface. If you are on a hard floor, you may want to use padding to cushion your knees, such as an exercise mat. 2. Line up your arms and legs. Your hands should be below your shoulders, and your knees should be below your hips. 3. Lift your left leg behind you. At the same time, raise your right arm and straighten it in front of you. ? Do not lift your leg higher than your hip. ? Do not lift your arm higher than your shoulder. ? Keep your abdominal and  back muscles tight. ? Keep your hips facing the ground. ? Do not arch your back. ? Keep your balance carefully, and do not hold your breath. 4. Hold for __________ seconds. 5. Slowly return to the starting position and repeat with your right leg and your left arm. Repeat __________ times. Complete this exercise __________ times a day. Exercise E: Abdominal set with straight leg raise  1. Lie on your back on a firm surface. 2. Bend one of your knees and keep your other leg straight. 3. Tense your abdominal muscles and lift your straight leg up, 4-6 inches (10-15 cm) off the  ground. 4. Keep your abdominal muscles tight and hold for __________ seconds. ? Do not hold your breath. ? Do not arch your back. Keep it flat against the ground. 5. Keep your abdominal muscles tense as you slowly lower your leg back to the starting position. 6. Repeat with your other leg. Repeat __________ times. Complete this exercise __________ times a day. Posture and body mechanics  Body mechanics refers to the movements and positions of your body while you do your daily activities. Posture is part of body mechanics. Good posture and healthy body mechanics can help to relieve stress in your body's tissues and joints. Good posture means that your spine is in its natural S-curve position (your spine is neutral), your shoulders are pulled back slightly, and your head is not tipped forward. The following are general guidelines for applying improved posture and body mechanics to your everyday activities. Standing   When standing, keep your spine neutral and your feet about hip-width apart. Keep a slight bend in your knees. Your ears, shoulders, and hips should line up.  When you do a task in which you stand in one place for a long time, place one foot up on a stable object that is 2-4 inches (5-10 cm) high, such as a footstool. This helps keep your spine neutral. Sitting   When sitting, keep your spine neutral and keep your feet flat on the floor. Use a footrest, if necessary, and keep your thighs parallel to the floor. Avoid rounding your shoulders, and avoid tilting your head forward.  When working at a desk or a computer, keep your desk at a height where your hands are slightly lower than your elbows. Slide your chair under your desk so you are close enough to maintain good posture.  When working at a computer, place your monitor at a height where you are looking straight ahead and you do not have to tilt your head forward or downward to look at the screen. Resting   When lying down and  resting, avoid positions that are most painful for you.  If you have pain with activities such as sitting, bending, stooping, or squatting (flexion-based activities), lie in a position in which your body does not bend very much. For example, avoid curling up on your side with your arms and knees near your chest (fetal position).  If you have pain with activities such as standing for a long time or reaching with your arms (extension-based activities), lie with your spine in a neutral position and bend your knees slightly. Try the following positions:  Lying on your side with a pillow between your knees.  Lying on your back with a pillow under your knees. Lifting   When lifting objects, keep your feet at least shoulder-width apart and tighten your abdominal muscles.  Bend your knees and hips and keep your spine neutral. It  is important to lift using the strength of your legs, not your back. Do not lock your knees straight out.  Always ask for help to lift heavy or awkward objects. This information is not intended to replace advice given to you by your health care provider. Make sure you discuss any questions you have with your health care provider. Document Released: 02/28/2005 Document Revised: 11/05/2015 Document Reviewed: 12/10/2014 Elsevier Interactive Patient Education  [employer].

## 2017-04-18 NOTE — Telephone Encounter (Signed)
a 

## 2017-04-24 NOTE — Progress Notes (Signed)
[place]  [phone] [phone]) [phone]     ID: Tanya [name] DOB: 05-Oct-1955  MR#: [id number]  FMB#:[id number]  Patient Care Team: Everrett Coombe, MD as PCP - General Rolm Bookbinder, MD as Consulting Physician (General Surgery) Lile Mccurley, Virgie Dad, MD as Consulting Physician (Oncology) Kyung Rudd, MD as Consulting Physician (Radiation Oncology) OTHER MD:  CHIEF COMPLAINT: Estrogen receptor positive breast cancer  CURRENT TREATMENT: anastrozole   BREAST CANCER HISTORY: From the original intake note:  Tanya [name] had routine bilateral screening mammography at the [facility] 04/19/2016. This showed a possible area of the symmetry in the left breast and a prominent left axillary lymph nodes. On 04/28/2016 she underwent diagnostic left mammography with tomography and left breast ultrasonography. This showed the breast density to be category B. There was an obscured mass in the upper outer left breast and an enlarged left axillary lymph node. On exam there was focal thickening at the 2:00 position of the left breast 9 cm from the nipple. Targeted ultrasonography confirmed a 1.8 cm hypoechoic mass with a second adjacent 1.1 cm mass both at the 2:00 position of the left breast 9 cm from the nipple. The conglomerate area measured 3.5 cm. There was also an enlarged left axillary lymph node with loss of fatty hilum measuring 1.8 cm.  On family 16 2018 the patient underwent biopsy of the larger left breast mass in question and the suspicious left axillary lymph node. 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  The patient's subsequent history is as detailed below  INTERVAL HISTORY: Tanya [name] returns today for follow-up and treatment of her  estrogen receptor positive breast cancer. She completed adjuvant radiation on  02/01/2017. She continues on anastrozole, with good tolerance. She continues to have neuropathy pain in her toes and fingers, but she notes that this has greatly improved. She notes that it feels numb.   REVIEW OF SYSTEMS: Tanya [name] reports that about a month ago, she saw Dr. Donne Hazel because her left breast is bigger than the right. She notes a quick pain in the right breast. She notes that the pain in the upper left anterior chest that as improved since the last time we saw her. She notes that she still has occasional pain in the left breast from surgery. She notes that when she removes her bra, the both of her breasts are warmer than the rest of her chest. She notes that she eats a lot, but her appetite has not been good. She notes that she is still gaining weight. She notes that she took 5 mg of melatonin for 5 days and lorazepam for sleep issues during the night, but it doesn't work. She notes that she takes nap during the day to make up for not sleeping at night. She notes that she takes dulcolax stool softeners for occasional constipation. She denies unusual headaches, visual changes, nausea, vomiting, or dizziness. There has been no unusual cough, phlegm production, or pleurisy. This been no change in bowel or bladder habits. She denies unexplained fatigue or unexplained weight loss, bleeding, rash, or fever. A detailed review of systems was otherwise stable.    PAST MEDICAL HISTORY: Past Medical History:  Diagnosis Date  . ANXIETY   . Arthritis   . Dyspnea on exertion   . HYPERLIPIDEMIA   . HYPERTENSION, BENIGN  ESSENTIAL   . Malignant neoplasm of upper-outer quadrant of left breast in female, estrogen receptor positive ([place]) 05/04/2016  . OBESITY   . Pre-diabetes    no medications    PAST SURGICAL HISTORY: Past Surgical History:  Procedure Laterality Date  . BREAST LUMPECTOMY WITH RADIOACTIVE SEED AND  SENTINEL LYMPH NODE BIOPSY Left 11/03/2016   Procedure: INJECT BLUE DYE LEFT BREAST, LEFT BREAST RADIOACTIVE SEED GUIDED LUMPECTOMY WITH LEFT SENTINEL LYMPH NODE BIOPSY AND RADIOACTIVE SEED TARGETED AXILLARY LYMPH NODE EXCISION;  Surgeon: Rolm Bookbinder, MD;  Location: [place];  Service: General;  Laterality: Left;  . CESAREAN SECTION    . PORT-A-CATH REMOVAL Right 11/03/2016   Procedure: REMOVAL PORT-A-CATH;  Surgeon: Rolm Bookbinder, MD;  Location: [place];  Service: General;  Laterality: Right;  . PORTACATH PLACEMENT Right 06/02/2016   Procedure: INSERTION PORT-A-CATH WITH ULTRASOUND GUIDANCE;  Surgeon: Rolm Bookbinder, MD;  Location: [place];  Service: General;  Laterality: Right;  . TUBAL LIGATION      FAMILY HISTORY Family History  Problem Relation Age of Onset  . Breast cancer Mother 81  The patient's father had a history of prostate cancer. He died at age 76. The patient's mother died at age 4 from unclear causes. She had a history of breast cancer diagnosed in her 57s. The patient had 4 brothers, 2 sisters. There is no other history of breast or ovarian cancer in the family to the patient's knowledge  GYNECOLOGIC HISTORY:  No LMP recorded. Patient is postmenopausal. Menarche age 62, first live birth age 54, the patient is [place] P4. She stopped having periods approximately 2006. She did not use hormone replacement. She used oral contraceptives briefly and remotely, with no complications.  SOCIAL HISTORY:  The patient is originally from [name]. 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 The patient's husband [name] was also a professor at [employer], where he taught agriculture oh research. Son Tanya [name] works in Land, son Tanya [name] works for [employer], daughter Tanya [name] is a Education officer, museum, and daughter Tanya [name] is also a Education officer, museum, all living in [place]. The patient has 3 grandchildren. She attends [employer]    ADVANCED DIRECTIVES: Not in  place   HEALTH MAINTENANCE: Social History   Tobacco Use  . Smoking status: Never Smoker  . Smokeless tobacco: Never Used  Substance Use Topics  . Alcohol use: Yes    Comment: rarely  . Drug use: No     Colonoscopy:Never  PAP: November 2017  Bone density: Never   Allergies  Allergen Reactions  . Ascorbic Acid & Derivatives Other (See Comments)    Mouth sores (Vitamin C) with higher doses  . Camoquin [Amodiaquine] Itching    Current Outpatient Medications  Medication Sig Dispense Refill  . anastrozole (ARIMIDEX) 1 MG tablet   3  . docusate sodium (COLACE) 100 MG capsule Take 1 capsule (100 mg total) by mouth 2 (two) times daily. 30 capsule 0  . gabapentin (NEURONTIN) 100 MG capsule TAKE 1 CAPSULE BY MOUTH THREE TIMES A DAY (Patient not taking: Reported on 03/21/2017) 90 capsule 0  . losartan (COZAAR) 50 MG tablet TAKE 1 TABLET (50 MG TOTAL) BY MOUTH DAILY. 90 tablet 2  . Multiple Vitamin (MULTIVITAMIN WITH MINERALS) TABS tablet Take 1 tablet by mouth 3 (three) times a week. One-A-Day Women's 50+    . naproxen (NAPROSYN) 500 MG tablet Take 1 tablet (500 mg total) by mouth 2 (two) times daily with a meal. 10 tablet 0  .  neomycin-bacitracin-polymyxin (NEOSPORIN) ointment Apply 1 application topically 2 (two) times daily as needed (for boil wound care).    . non-metallic deodorant Jethro Poling) MISC Apply 1 application topically.    . nystatin cream (MYCOSTATIN) Apply 1 application topically 3 (three) times daily. 30 g 1  . omeprazole (PRILOSEC) 40 MG capsule Take 1 capsule (40 mg total) by mouth daily. (Patient not taking: Reported on 10/21/2016) 90 capsule 3  . oxyCODONE (OXY IR/ROXICODONE) 5 MG immediate release tablet Take 1 tablet (5 mg total) by mouth every 6 (six) hours as needed for moderate pain, severe pain or breakthrough pain. (Patient not taking: Reported on 11/24/2016) 15 tablet 0  . pravastatin (PRAVACHOL) 40 MG tablet TAKE 1 TABLET (40 MG TOTAL) BY MOUTH AT BEDTIME. 90 tablet 2   . prochlorperazine (COMPAZINE) 10 MG tablet Take 1 tablet (10 mg total) by mouth every 6 (six) hours as needed (Nausea or vomiting). (Patient not taking: Reported on 10/21/2016) 30 tablet 1  . simethicone (MYLICON) 80 MG chewable tablet Chew 80 mg by mouth every 6 (six) hours as needed (for gas).      No current facility-administered medications for this visit.     OBJECTIVE: Middle-aged African-American woman in no acute distress  Vitals:   04/25/17 1500  BP: (!) 158/86  Pulse: 83  Resp: 18  Temp: 99.5 F (37.5 C)  SpO2: 100%     Body mass index is 34.1 kg/m.    ECOG FS:1 - Symptomatic but completely ambulatory  Sclerae unicteric, pupils round and equal Oropharynx clear and moist No cervical or supraclavicular adenopathy Lungs no rales or rhonchi Heart regular rate and rhythm Abd soft, nontender, positive bowel sounds MSK no focal spinal tenderness, no left upper extremity lymphedema Neuro: nonfocal, well oriented, appropriate affect Breasts: The right breast is unremarkable.  The left breast is status post lumpectomy and radiation.  The cosmetic result is excellent.  The patient is correct that the left breast is currently somewhat larger than the right.  This is imaged below.  Not however no erythema, no palpable mass, and also no tenderness.  Both axillae are benign  Bilateral Breasts 04/25/2017     LAB RESULTS:  CMP     Component Value Date/Time   NA 141 01/18/2017 1425   K 3.8 01/18/2017 1425   CL 107 10/25/2016 1050   CO2 27 01/18/2017 1425   GLUCOSE 107 01/18/2017 1425   BUN 12.5 01/18/2017 1425   CREATININE 0.8 01/18/2017 1425   CALCIUM 9.0 01/18/2017 1425   PROT 6.7 01/18/2017 1425   ALBUMIN 3.7 01/18/2017 1425   AST 19 01/18/2017 1425   ALT 18 01/18/2017 1425   ALKPHOS 73 01/18/2017 1425   BILITOT 0.30 01/18/2017 1425   GFRNONAA >60 10/25/2016 1050   GFRAA >60 10/25/2016 1050    INo results found for: SPEP, UPEP  CBC    Component Value  Date/Time   WBC 4.7 04/25/2017 1438   RBC 5.01 04/25/2017 1438   HGB 12.5 04/25/2017 1438   HGB 12.5 01/18/2017 1425   HCT 39.1 04/25/2017 1438   HCT 39.5 01/18/2017 1425   PLT 178 04/25/2017 1438   PLT 164 01/18/2017 1425   MCV 78.1 (L) 04/25/2017 1438   MCV 77.1 (L) 01/18/2017 1425   MCH 24.9 (L) 04/25/2017 1438   MCHC 31.9 04/25/2017 1438   RDW 17.1 (H) 04/25/2017 1438   RDW 18.1 (H) 01/18/2017 1425   LYMPHSABS 1.7 04/25/2017 1438   LYMPHSABS 0.9 01/18/2017 1425  MONOABS 0.4 04/25/2017 1438   MONOABS 0.5 01/18/2017 1425   EOSABS 0.1 04/25/2017 1438   EOSABS 0.1 01/18/2017 1425   BASOSABS 0.1 04/25/2017 1438   BASOSABS 0.0 01/18/2017 1425        Chemistry      Component Value Date/Time   NA 141 01/18/2017 1425   K 3.8 01/18/2017 1425   CL 107 10/25/2016 1050   CO2 27 01/18/2017 1425   BUN 12.5 01/18/2017 1425   CREATININE 0.8 01/18/2017 1425      Component Value Date/Time   CALCIUM 9.0 01/18/2017 1425   ALKPHOS 73 01/18/2017 1425   AST 19 01/18/2017 1425   ALT 18 01/18/2017 1425   BILITOT 0.30 01/18/2017 1425       No results found for: LABCA2  No components found for: LABCA125  No results for input(s): INR in the last 168 hours.  Urinalysis    Component Value Date/Time   COLORURINE yellow 06/01/2006 1116   APPEARANCEUR Clear 06/01/2006 1116   LABSPEC 1.010 08/18/2016 1217   PHURINE 6.5 08/18/2016 1217   PHURINE 6.5 06/01/2006 1116   GLUCOSEU Negative 08/18/2016 1217   HGBUR Negative 08/18/2016 1217   HGBUR negative 06/01/2006 1116   BILIRUBINUR Negative 08/18/2016 1217   KETONESUR Negative 08/18/2016 1217   PROTEINUR Negative 08/18/2016 1217   UROBILINOGEN 0.2 08/18/2016 1217   NITRITE Negative 08/18/2016 1217   NITRITE negative 06/01/2006 1116   LEUKOCYTESUR Negative 08/18/2016 1217     STUDIES: She will be due for mammography in August  ELIGIBLE FOR AVAILABLE RESEARCH PROTOCOL: Not a candidate for Prevent (already on a statin  drug)  ASSESSMENT: 62 y.o. [place] woman status post left breast upper inner quadrant and left axillary lymph node biopsy every 16 2018, both showing invasive ductal carcinoma, grade 3, clinically T1c N1a, or stage IIB, estrogen receptor positive, progesterone receptor and HER-2 negative, with an MIB-1 of 40%  (a) MRI biopsy 05/25/2016 of a second area of concern in the left breast also shows invasive ductal carcinoma  (1) neoadjuvant anastrozole started 05/11/2016, held at the start of chemotherapy  (2) Mammaprint results shows the tumor to be luminal B, high risk, indicating a need for chemotherapy.  (3) chemotherapy consisting of doxorubicin and cyclophosphamide in dose dense fashion 4 starting 06/09/2016, completed 07/21/2016, followed by weekly paclitaxel started 08/11/2016  (a) paclitaxel stopped after cycle 8 because of neuropathy; last dose 09/29/2016  (4) left lumpectomy and sentinel lymph node sampling 11/03/2016 showed no residual tumor in the breast. One of 2 sentinel lymph nodes was involved by tumor. Margins were clear. pT0 pN1  (5) adjuvant radiation completed 02/01/2017 1. Left breast and supraclavicular 50.4 Gy in 28 fractions 2. Boost to the seroma 10 Gy in 5 fractions Total of 60.4 Gy  (6) to resume anastrozole 02/11/2017  PLAN: I spent approximately 30 minutes with Chrys Racer with most of that time spent discussing her many questions and concerns.  Principally she is worried that her left breast is larger than the right.  I think there is probably some edema in the left breast which could be related to her surgery or radiation.  I do not see any evidence of inflammation or infection.  I am not sure if this is going to change in the future.  Possibly if she wore a compression bra but this could decreased some but likely not permanently.  For now though I think this only needs observation.  She would like to lose weight but does not  like the idea of not eating past.  I  suggested she not eat pasta, rice, potatoes, bread, sodas, or desserts except for a small dark chocolate square.  If she does that she will lose 30 pounds over a year.  She will give it some thought  She has insomnia but she sleeps sometimes during the day.  I suggested she take no naps ever for any reason and that she keep her room at night cold.  She sometimes gets inframammary yeast infections.  Nystatin has not been all that helpful.  I wrote her for ketoconazole cream today.  Otherwise she is going to return to see me in September.  She knows to call for any issues that may develop before the.  Rayana Geurin, Virgie Dad, MD  04/25/17 3:30 PM Medical Oncology and Hematology [facility] [place], [place] [address] Tel. [phone]    Fax. [phone]  This document serves as a record of services personally performed by Lurline Del, MD. It was created on his behalf by Sheron Nightingale, a trained medical scribe. The creation of this record is based on the scribe's personal observations and the provider's statements to them.   I have reviewed the above documentation for accuracy and completeness, and I agree with the above.

## 2017-04-25 ENCOUNTER — Inpatient Hospital Stay: Payer: BLUE CROSS/BLUE SHIELD

## 2017-04-25 ENCOUNTER — Inpatient Hospital Stay: Payer: BLUE CROSS/BLUE SHIELD | Attending: Oncology | Admitting: Oncology

## 2017-04-25 VITALS — BP 158/86 | HR 83 | Temp 99.5°F | Resp 18 | Ht 63.0 in | Wt 192.5 lb

## 2017-04-25 DIAGNOSIS — C50412 Malignant neoplasm of upper-outer quadrant of left female breast: Secondary | ICD-10-CM

## 2017-04-25 DIAGNOSIS — Z79899 Other long term (current) drug therapy: Secondary | ICD-10-CM | POA: Insufficient documentation

## 2017-04-25 DIAGNOSIS — Z9221 Personal history of antineoplastic chemotherapy: Secondary | ICD-10-CM | POA: Insufficient documentation

## 2017-04-25 DIAGNOSIS — Z79811 Long term (current) use of aromatase inhibitors: Secondary | ICD-10-CM | POA: Insufficient documentation

## 2017-04-25 DIAGNOSIS — G4700 Insomnia, unspecified: Secondary | ICD-10-CM | POA: Diagnosis not present

## 2017-04-25 DIAGNOSIS — G620 Drug-induced polyneuropathy: Secondary | ICD-10-CM | POA: Insufficient documentation

## 2017-04-25 DIAGNOSIS — Z170 Estrogen receptor positive status [ER+]: Secondary | ICD-10-CM | POA: Diagnosis not present

## 2017-04-25 DIAGNOSIS — B379 Candidiasis, unspecified: Secondary | ICD-10-CM | POA: Insufficient documentation

## 2017-04-25 DIAGNOSIS — Z923 Personal history of irradiation: Secondary | ICD-10-CM | POA: Insufficient documentation

## 2017-04-25 LAB — COMPREHENSIVE METABOLIC PANEL
ALBUMIN: 3.7 g/dL (ref 3.5–5.0)
ALK PHOS: 71 U/L (ref 40–150)
ALT: 13 U/L (ref 0–55)
ANION GAP: 8 (ref 3–11)
AST: 16 U/L (ref 5–34)
BILIRUBIN TOTAL: 0.5 mg/dL (ref 0.2–1.2)
BUN: 13 mg/dL (ref 7–26)
CALCIUM: 9.2 mg/dL (ref 8.4–10.4)
CO2: 29 mmol/L (ref 22–29)
Chloride: 104 mmol/L (ref 98–109)
Creatinine, Ser: 0.96 mg/dL (ref 0.60–1.10)
GFR calc Af Amer: >60 mL/min (ref >60)
GFR calc non Af Amer: >60 mL/min (ref >60)
GLUCOSE: 79 mg/dL (ref 70–140)
Potassium: 4.4 mmol/L (ref 3.5–5.1)
Sodium: 141 mmol/L (ref 136–145)
TOTAL PROTEIN: 6.9 g/dL (ref 6.4–8.3)

## 2017-04-25 LAB — CBC WITH DIFFERENTIAL/PLATELET
BASOS ABS: 0.1 10*3/uL (ref 0.0–0.1)
Basophils Relative: 1 %
Eosinophils Absolute: 0.1 10*3/uL (ref 0.0–0.5)
Eosinophils Relative: 2 %
HEMATOCRIT: 39.1 % (ref 34.8–46.6)
Hemoglobin: 12.5 g/dL (ref 11.6–15.9)
LYMPHS PCT: 36 %
Lymphs Abs: 1.7 10*3/uL (ref 0.9–3.3)
MCH: 24.9 pg — ABNORMAL LOW (ref 25.1–34.0)
MCHC: 31.9 g/dL (ref 31.5–36.0)
MCV: 78.1 fL — AB (ref 79.5–101.0)
Monocytes Absolute: 0.4 10*3/uL (ref 0.1–0.9)
Monocytes Relative: 9 %
NEUTROS ABS: 2.5 10*3/uL (ref 1.5–6.5)
NEUTROS PCT: 52 %
PLATELETS: 178 10*3/uL (ref 145–400)
RBC: 5.01 MIL/uL (ref 3.70–5.45)
RDW: 17.1 % — ABNORMAL HIGH (ref 11.2–14.5)
WBC: 4.7 10*3/uL (ref 3.9–10.3)

## 2017-04-25 MED ORDER — KETOCONAZOLE 2 % EX CREA: 1.0000 "application " | TOPICAL_CREAM | Freq: Every day | CUTANEOUS | 0 refills | Status: DC

## 2017-04-25 MED ORDER — ANASTROZOLE 1 MG PO TABS: 1.0000 mg | ORAL_TABLET | Freq: Every day | ORAL | 4 refills | Status: DC

## 2017-06-29 ENCOUNTER — Other Ambulatory Visit: Payer: Self-pay | Admitting: *Deleted

## 2017-06-29 DIAGNOSIS — C50412 Malignant neoplasm of upper-outer quadrant of left female breast: Secondary | ICD-10-CM

## 2017-06-29 DIAGNOSIS — Z170 Estrogen receptor positive status [ER+]: Principal | ICD-10-CM

## 2017-08-15 ENCOUNTER — Ambulatory Visit
Admission: RE | Admit: 2017-08-15 | Discharge: 2017-08-15 | Disposition: A | Discharge status: Home / Self Care | Payer: BLUE CROSS/BLUE SHIELD | Source: Ambulatory Visit | Attending: Oncology | Admitting: Oncology

## 2017-08-15 DIAGNOSIS — Z170 Estrogen receptor positive status [ER+]: Principal | ICD-10-CM

## 2017-08-15 DIAGNOSIS — C50412 Malignant neoplasm of upper-outer quadrant of left female breast: Secondary | ICD-10-CM

## 2017-08-15 DIAGNOSIS — R928 Other abnormal and inconclusive findings on diagnostic imaging of breast: Secondary | ICD-10-CM | POA: Diagnosis not present

## 2017-08-15 HISTORY — DX: Personal history of irradiation: Z92.3

## 2017-08-15 HISTORY — DX: Personal history of antineoplastic chemotherapy: Z92.21

## 2017-09-28 DIAGNOSIS — C50412 Malignant neoplasm of upper-outer quadrant of left female breast: Secondary | ICD-10-CM | POA: Diagnosis not present

## 2017-10-22 ENCOUNTER — Other Ambulatory Visit: Payer: Self-pay | Admitting: Student in an Organized Health Care Education/Training Program

## 2017-11-14 ENCOUNTER — Ambulatory Visit: Payer: BLUE CROSS/BLUE SHIELD | Admitting: Oncology

## 2017-11-14 ENCOUNTER — Other Ambulatory Visit: Payer: BLUE CROSS/BLUE SHIELD

## 2017-11-16 ENCOUNTER — Telehealth: Payer: Self-pay | Admitting: Oncology

## 2017-11-16 NOTE — Telephone Encounter (Signed)
Returned call to patient. Rescheduled 9/3 lab/fu to 9/10 with LC. GM next available is December. Per patient December is too long. Patient aware she will be seeing LC.

## 2017-11-21 ENCOUNTER — Encounter: Payer: Self-pay | Admitting: Adult Health

## 2017-11-21 ENCOUNTER — Inpatient Hospital Stay: Payer: BLUE CROSS/BLUE SHIELD | Attending: Oncology

## 2017-11-21 ENCOUNTER — Inpatient Hospital Stay (HOSPITAL_BASED_OUTPATIENT_CLINIC_OR_DEPARTMENT_OTHER): Payer: BLUE CROSS/BLUE SHIELD | Admitting: Adult Health

## 2017-11-21 VITALS — BP 147/94 | HR 92 | Temp 98.2°F | Resp 18 | Ht 63.0 in | Wt 195.2 lb

## 2017-11-21 DIAGNOSIS — Z923 Personal history of irradiation: Secondary | ICD-10-CM | POA: Diagnosis not present

## 2017-11-21 DIAGNOSIS — Z79811 Long term (current) use of aromatase inhibitors: Secondary | ICD-10-CM | POA: Diagnosis not present

## 2017-11-21 DIAGNOSIS — Z170 Estrogen receptor positive status [ER+]: Secondary | ICD-10-CM | POA: Diagnosis not present

## 2017-11-21 DIAGNOSIS — Z79899 Other long term (current) drug therapy: Secondary | ICD-10-CM | POA: Diagnosis not present

## 2017-11-21 DIAGNOSIS — Z8042 Family history of malignant neoplasm of prostate: Secondary | ICD-10-CM | POA: Insufficient documentation

## 2017-11-21 DIAGNOSIS — G629 Polyneuropathy, unspecified: Secondary | ICD-10-CM | POA: Insufficient documentation

## 2017-11-21 DIAGNOSIS — C773 Secondary and unspecified malignant neoplasm of axilla and upper limb lymph nodes: Secondary | ICD-10-CM | POA: Insufficient documentation

## 2017-11-21 DIAGNOSIS — Z9221 Personal history of antineoplastic chemotherapy: Secondary | ICD-10-CM | POA: Insufficient documentation

## 2017-11-21 DIAGNOSIS — E2839 Other primary ovarian failure: Secondary | ICD-10-CM

## 2017-11-21 DIAGNOSIS — C50412 Malignant neoplasm of upper-outer quadrant of left female breast: Secondary | ICD-10-CM | POA: Diagnosis not present

## 2017-11-21 DIAGNOSIS — Z803 Family history of malignant neoplasm of breast: Secondary | ICD-10-CM | POA: Insufficient documentation

## 2017-11-21 LAB — CBC WITH DIFFERENTIAL/PLATELET
BASOS ABS: 0 10*3/uL (ref 0.0–0.1)
BASOS PCT: 1 %
EOS ABS: 0.1 10*3/uL (ref 0.0–0.5)
EOS PCT: 1 %
HEMATOCRIT: 39.5 % (ref 34.8–46.6)
Hemoglobin: 12.6 g/dL (ref 11.6–15.9)
Lymphocytes Relative: 41 %
Lymphs Abs: 2.1 10*3/uL (ref 0.9–3.3)
MCH: 25.7 pg (ref 25.1–34.0)
MCHC: 31.9 g/dL (ref 31.5–36.0)
MCV: 80.4 fL (ref 79.5–101.0)
MONO ABS: 0.4 10*3/uL (ref 0.1–0.9)
MONOS PCT: 7 %
Neutro Abs: 2.6 10*3/uL (ref 1.5–6.5)
Neutrophils Relative %: 50 %
PLATELETS: 162 10*3/uL (ref 145–400)
RBC: 4.91 MIL/uL (ref 3.70–5.45)
RDW: 15.9 % — AB (ref 11.2–14.5)
WBC: 5.1 10*3/uL (ref 3.9–10.3)

## 2017-11-21 LAB — COMPREHENSIVE METABOLIC PANEL
ALBUMIN: 3.9 g/dL (ref 3.5–5.0)
ALT: 15 U/L (ref 0–44)
ANION GAP: 9 (ref 5–15)
AST: 18 U/L (ref 15–41)
Alkaline Phosphatase: 76 U/L (ref 38–126)
BILIRUBIN TOTAL: 0.5 mg/dL (ref 0.3–1.2)
BUN: 12 mg/dL (ref 8–23)
CHLORIDE: 106 mmol/L (ref 98–111)
CO2: 28 mmol/L (ref 22–32)
Calcium: 9.5 mg/dL (ref 8.9–10.3)
Creatinine, Ser: 0.85 mg/dL (ref 0.44–1.00)
GFR calc Af Amer: >60 mL/min (ref >60)
GFR calc non Af Amer: >60 mL/min (ref >60)
GLUCOSE: 98 mg/dL (ref 70–99)
POTASSIUM: 4.3 mmol/L (ref 3.5–5.1)
SODIUM: 143 mmol/L (ref 135–145)
Total Protein: 7.1 g/dL (ref 6.5–8.1)

## 2017-11-21 NOTE — Progress Notes (Signed)
[facility]  [phone] [phone]     ID: Patrice Matthew DOB: 15-May-1955  MR#: [id number]  BOF#:[id number]  Patient Care Team: Everrett Coombe, MD as PCP - General Rolm Bookbinder, MD as Consulting Physician (General Surgery) Magrinat, Virgie Dad, MD as Consulting Physician (Oncology) Kyung Rudd, MD as Consulting Physician (Radiation Oncology) OTHER MD:  CHIEF COMPLAINT: Estrogen receptor positive breast cancer  CURRENT TREATMENT: anastrozole   BREAST CANCER HISTORY: From the original intake note:  Makyla had routine bilateral screening mammography at the [facility] 04/19/2016. This showed a possible area of the symmetry in the left breast and a prominent left axillary lymph nodes. On 04/28/2016 she underwent diagnostic left mammography with tomography and left breast ultrasonography. This showed the breast density to be category B. There was an obscured mass in the upper outer left breast and an enlarged left axillary lymph node. On exam there was focal thickening at the 2:00 position of the left breast 9 cm from the nipple. Targeted ultrasonography confirmed a 1.8 cm hypoechoic mass with a second adjacent 1.1 cm mass both at the 2:00 position of the left breast 9 cm from the nipple. The conglomerate area measured 3.5 cm. There was also an enlarged left axillary lymph node with loss of fatty hilum measuring 1.8 cm.  On family 16 2018 the patient underwent biopsy of the larger left breast mass in question and the suspicious left axillary lymph node. 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  The patient's subsequent history is as detailed below  INTERVAL HISTORY: Nayali returns today for follow-up and treatment of her  estrogen receptor positive breast cancer.  She continues on anastrozole, with good tolerance. She denies hot flashes, arthralgias, or vaginal dryness.  She has not undergone bone density testing.     REVIEW OF SYSTEMS: Kimbrely notes a continued but improved asymmetry in her breasts with the left breast being larger than the right.  She has f/u with Dr. Donne Hazel about this.  Since her last visit she underwent bilateral mammogram that was normal with a breast density category of b.  She sees her PCP regularly.  She has not undergone skin cancer or colon cancer screening.  She continues to have peripheral neuropathy but notes that it isn't bothering her very much. She occasionally will feel off balance, but this doesn't happen often. She describes a twitch that is an odd sensation that will occur at different intervals intermittently on different parts of her body.    Other than that Aime is doing well today and a detailed ROS was non contributory.     PAST MEDICAL HISTORY: Past Medical History:  Diagnosis Date  . ANXIETY   . Arthritis   . Dyspnea on exertion   . HYPERLIPIDEMIA   . HYPERTENSION, BENIGN ESSENTIAL   . Malignant neoplasm of upper-outer quadrant of left breast in female, estrogen receptor positive ([place]) 05/04/2016  . OBESITY   . Personal history of chemotherapy   . Personal history of radiation therapy   . Pre-diabetes    no medications    PAST SURGICAL HISTORY: Past Surgical History:  Procedure Laterality Date  . BREAST LUMPECTOMY Left 10/2016  . BREAST LUMPECTOMY WITH RADIOACTIVE SEED AND SENTINEL LYMPH NODE BIOPSY Left 11/03/2016   Procedure: INJECT BLUE DYE LEFT BREAST, LEFT BREAST  RADIOACTIVE SEED GUIDED LUMPECTOMY WITH LEFT SENTINEL LYMPH NODE BIOPSY AND RADIOACTIVE SEED TARGETED AXILLARY LYMPH NODE EXCISION;  Surgeon: Rolm Bookbinder, MD;  Location: [facility];  Service: General;  Laterality: Left;  . CESAREAN SECTION    . PORT-A-CATH REMOVAL Right 11/03/2016    Procedure: REMOVAL PORT-A-CATH;  Surgeon: Rolm Bookbinder, MD;  Location: [facility];  Service: General;  Laterality: Right;  . PORTACATH PLACEMENT Right 06/02/2016   Procedure: INSERTION PORT-A-CATH WITH ULTRASOUND GUIDANCE;  Surgeon: Rolm Bookbinder, MD;  Location: [facility];  Service: General;  Laterality: Right;  . TUBAL LIGATION      FAMILY HISTORY Family History  Problem Relation Age of Onset  . Breast cancer Mother 18  The patient's father had a history of prostate cancer. He died at age 68. The patient's mother died at age 18 from unclear causes. She had a history of breast cancer diagnosed in her 84s. The patient had 4 brothers, 2 sisters. There is no other history of breast or ovarian cancer in the family to the patient's knowledge  GYNECOLOGIC HISTORY:  No LMP recorded. Patient is postmenopausal. Menarche age 23, first live birth age 44, the patient is [place] P4. She stopped having periods approximately 2006. She did not use hormone replacement. She used oral contraceptives briefly and remotely, with no complications.  SOCIAL HISTORY:  The patient is originally from [name]. 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 The patient's husband [name] was also a professor at [employer], where he taught agriculture oh research. Son [name] works in [place], son [name] works for [employer], daughter [name] is a Education officer, museum, and daughter [name] is also a Education officer, museum, all living in [place]. The patient has 3 grandchildren. She attends [employer]    ADVANCED DIRECTIVES: Not in place   HEALTH MAINTENANCE: Social History   Tobacco Use  . Smoking status: Never Smoker  . Smokeless tobacco: Never Used  Substance Use Topics  . Alcohol use: Yes    Comment: rarely  . Drug use: No     Colonoscopy:Never  PAP: November 2017  Bone density: Never   Allergies  Allergen Reactions  . Ascorbic Acid & Derivatives Other (See Comments)    Mouth sores  (Vitamin C) with higher doses  . Camoquin [Amodiaquine] Itching    Current Outpatient Medications  Medication Sig Dispense Refill  . anastrozole (ARIMIDEX) 1 MG tablet Take 1 tablet (1 mg total) by mouth daily. 90 tablet 4  . ketoconazole (NIZORAL) 2 % cream Apply 1 application topically daily. (Patient taking differently: Apply 1 application topically daily. As needed) 15 g 0  . losartan (COZAAR) 50 MG tablet TAKE 1 TABLET (50 MG TOTAL) BY MOUTH DAILY. 90 tablet 2  . Multiple Vitamin (MULTIVITAMIN WITH MINERALS) TABS tablet Take 1 tablet by mouth 3 (three) times a week. One-A-Day Women's 50+    . naproxen (NAPROSYN) 500 MG tablet Take 1 tablet (500 mg total) by mouth 2 (two) times daily with a meal. (Patient taking differently: Take 500 mg by mouth 2 (two) times daily with a meal. As needed) 10 tablet 0  . nystatin (MYCOSTATIN/NYSTOP) powder 1 APPLICATION TWICE DAILY  1  . pravastatin (PRAVACHOL) 40 MG tablet TAKE 1 TABLET BY MOUTH AT BEDTIME 90 tablet 2   No current facility-administered medications for this visit.     OBJECTIVE:   Vitals:   11/21/17 1424  BP: (!) 147/94  Pulse: 92  Resp: 18  Temp: 98.2 F (36.8 C)  SpO2: 100%     Body mass index is 34.58 kg/m.    ECOG FS:1 - Symptomatic but completely ambulatory  GENERAL: Patient is a well appearing female in no acute distress HEENT:  Sclerae anicteric.  Oropharynx clear and moist. No ulcerations or evidence of oropharyngeal candidiasis. Neck is supple.  NODES:  No cervical, supraclavicular, or axillary lymphadenopathy palpated.  BREAST EXAM: left breast is slightly swollen, s/p lumpectomy, no nodules or masses, right breast without nodules, masses, skin or nipple changes.   LUNGS:  Clear to auscultation bilaterally.  No wheezes or rhonchi. HEART:  Regular rate and rhythm. No murmur appreciated. ABDOMEN:  Soft, nontender.  Positive, normoactive bowel sounds. No organomegaly palpated. MSK:  No focal spinal tenderness to  palpation. Full range of motion bilaterally in the upper extremities. EXTREMITIES:  No peripheral edema.   SKIN:  Clear with no obvious rashes or skin changes. No nail dyscrasia. NEURO:  Nonfocal. Well oriented.  Appropriate affect.       LAB RESULTS:  CMP     Component Value Date/Time   NA 141 04/25/2017 1438   NA 141 01/18/2017 1425   K 4.4 04/25/2017 1438   K 3.8 01/18/2017 1425   CL 104 04/25/2017 1438   CO2 29 04/25/2017 1438   CO2 27 01/18/2017 1425   GLUCOSE 79 04/25/2017 1438   GLUCOSE 107 01/18/2017 1425   BUN 13 04/25/2017 1438   BUN 12.5 01/18/2017 1425   CREATININE 0.96 04/25/2017 1438   CREATININE 0.8 01/18/2017 1425   CALCIUM 9.2 04/25/2017 1438   CALCIUM 9.0 01/18/2017 1425   PROT 6.9 04/25/2017 1438   PROT 6.7 01/18/2017 1425   ALBUMIN 3.7 04/25/2017 1438   ALBUMIN 3.7 01/18/2017 1425   AST 16 04/25/2017 1438   AST 19 01/18/2017 1425   ALT 13 04/25/2017 1438   ALT 18 01/18/2017 1425   ALKPHOS 71 04/25/2017 1438   ALKPHOS 73 01/18/2017 1425   BILITOT 0.5 04/25/2017 1438   BILITOT 0.30 01/18/2017 1425   GFRNONAA >60 04/25/2017 1438   GFRAA >60 04/25/2017 1438    INo results found for: SPEP, UPEP  CBC    Component Value Date/Time   WBC 5.1 11/21/2017 1400   RBC 4.91 11/21/2017 1400   HGB 12.6 11/21/2017 1400   HGB 12.5 01/18/2017 1425   HCT 39.5 11/21/2017 1400   HCT 39.5 01/18/2017 1425   PLT 162 11/21/2017 1400   PLT 164 01/18/2017 1425   MCV 80.4 11/21/2017 1400   MCV 77.1 (L) 01/18/2017 1425   MCH 25.7 11/21/2017 1400   MCHC 31.9 11/21/2017 1400   RDW 15.9 (H) 11/21/2017 1400   RDW 18.1 (H) 01/18/2017 1425   LYMPHSABS 2.1 11/21/2017 1400   LYMPHSABS 0.9 01/18/2017 1425   MONOABS 0.4 11/21/2017 1400   MONOABS 0.5 01/18/2017 1425   EOSABS 0.1 11/21/2017 1400   EOSABS 0.1 01/18/2017 1425   BASOSABS 0.0 11/21/2017 1400   BASOSABS 0.0 01/18/2017 1425        Chemistry      Component Value Date/Time   NA 141 04/25/2017 1438    NA 141 01/18/2017 1425   K 4.4 04/25/2017 1438   K 3.8 01/18/2017 1425   CL 104 04/25/2017 1438   CO2 29 04/25/2017 1438   CO2 27 01/18/2017 1425   BUN 13 04/25/2017 1438   BUN 12.5 01/18/2017 1425   CREATININE 0.96 04/25/2017 1438   CREATININE 0.8 01/18/2017 1425      Component Value Date/Time  CALCIUM 9.2 04/25/2017 1438   CALCIUM 9.0 01/18/2017 1425   ALKPHOS 71 04/25/2017 1438   ALKPHOS 73 01/18/2017 1425   AST 16 04/25/2017 1438   AST 19 01/18/2017 1425   ALT 13 04/25/2017 1438   ALT 18 01/18/2017 1425   BILITOT 0.5 04/25/2017 1438   BILITOT 0.30 01/18/2017 1425       No results found for: LABCA2  No components found for: LABCA125  No results for input(s): INR in the last 168 hours.  Urinalysis    Component Value Date/Time   COLORURINE yellow 06/01/2006 1116   APPEARANCEUR Clear 06/01/2006 1116   LABSPEC 1.010 08/18/2016 1217   PHURINE 6.5 08/18/2016 1217   PHURINE 6.5 06/01/2006 1116   GLUCOSEU Negative 08/18/2016 1217   HGBUR Negative 08/18/2016 1217   HGBUR negative 06/01/2006 1116   BILIRUBINUR Negative 08/18/2016 1217   KETONESUR Negative 08/18/2016 1217   PROTEINUR Negative 08/18/2016 1217   UROBILINOGEN 0.2 08/18/2016 1217   NITRITE Negative 08/18/2016 1217   NITRITE negative 06/01/2006 1116   LEUKOCYTESUR Negative 08/18/2016 1217     STUDIES:   ELIGIBLE FOR AVAILABLE RESEARCH PROTOCOL: Not a candidate for Prevent (already on a statin drug)  ASSESSMENT: 62 y.o. [place] woman status post left breast upper inner quadrant and left axillary lymph node biopsy every 16 2018, both showing invasive ductal carcinoma, grade 3, clinically T1c N1a, or stage IIB, estrogen receptor positive, progesterone receptor and HER-2 negative, with an MIB-1 of 40%  (a) MRI biopsy 05/25/2016 of a second area of concern in the left breast also shows invasive ductal carcinoma  (1) neoadjuvant anastrozole started 05/11/2016, held at the start of chemotherapy  (2)  Mammaprint results shows the tumor to be luminal B, high risk, indicating a need for chemotherapy.  (3) chemotherapy consisting of doxorubicin and cyclophosphamide in dose dense fashion 4 starting 06/09/2016, completed 07/21/2016, followed by weekly paclitaxel started 08/11/2016  (a) paclitaxel stopped after cycle 8 because of neuropathy; last dose 09/29/2016  (4) left lumpectomy and sentinel lymph node sampling 11/03/2016 showed no residual tumor in the breast. One of 2 sentinel lymph nodes was involved by tumor. Margins were clear. pT0 pN1  (5) adjuvant radiation completed 02/01/2017 1. Left breast and supraclavicular 50.4 Gy in 28 fractions 2. Boost to the seroma 10 Gy in 5 fractions Total of 60.4 Gy  (6) to resume anastrozole 02/11/2017  PLAN:  Conny is doing well today.  She has no clinical or radiographic sign of recurrence.  She will continue on anastrozole.  She is tolerating it well.  She will be due for mammogram in 08/2018.  She and I reviewed health maintenance which includes healthy diet, exercise.  I ordered a bone density test for her to have done.  We reviewed bone health in detail and I gave her info about this in her avs.  In regards to this odd twitch sensation, I recommended she look into her water intake.  I also told her we could send her to neurology for evaluation.  She declined a referral today.    She will return in 6 months for labs and /fu with Dr. Jana Hakim.  She knows to call for any issues that may develop before then.    A total of (30) minutes of face-to-face time was spent with this patient with greater than 50% of that time in counseling and care-coordination.   Wilber Bihari, NP  11/21/17 2:38 PM Medical Oncology [facility] H[facility] [place] [address]  [place],  [address] Tel. [phone]    Fax. [phone]

## 2017-11-21 NOTE — Patient Instructions (Signed)
Bone Health Bones protect organs, store calcium, and anchor muscles. Good health habits, such as eating nutritious foods and exercising regularly, are important for maintaining healthy bones. They can also help to prevent a condition that causes bones to lose density and become weak and brittle (osteoporosis). Why is bone mass important? Bone mass refers to the amount of bone tissue that you have. The higher your bone mass, the stronger your bones. An important step toward having healthy bones throughout life is to have strong and dense bones during childhood. A young adult who has a high bone mass is more likely to have a high bone mass later in life. Bone mass at its greatest it is called peak bone mass. A large decline in bone mass occurs in older adults. In women, it occurs about the time of menopause. During this time, it is important to practice good health habits, because if more bone is lost than what is replaced, the bones will become less healthy and more likely to break (fracture). If you find that you have a low bone mass, you may be able to prevent osteoporosis or further bone loss by changing your diet and lifestyle. How can I find out if my bone mass is low? Bone mass can be measured with an X-ray test that is called a bone mineral density (BMD) test. This test is recommended for all women who are age 65 or older. It may also be recommended for men who are age 70 or older, or for people who are more likely to develop osteoporosis due to:  Having bones that break easily.  Having a long-term disease that weakens bones, such as kidney disease or rheumatoid arthritis.  Having menopause earlier than normal.  Taking medicine that weakens bones, such as steroids, thyroid hormones, or hormone treatment for breast cancer or prostate cancer.  Smoking.  Drinking three or more alcoholic drinks each day.  What are the nutritional recommendations for healthy bones? To have healthy bones, you  need to get enough of the right minerals and vitamins. Most nutrition experts recommend getting these nutrients from the foods that you eat. Nutritional recommendations vary from person to person. Ask your health care provider what is healthy for you. Here are some general guidelines. Calcium Recommendations Calcium is the most important (essential) mineral for bone health. Most people can get enough calcium from their diet, but supplements may be recommended for people who are at risk for osteoporosis. Good sources of calcium include:  Dairy products, such as low-fat or nonfat milk, cheese, and yogurt.  Dark green leafy vegetables, such as bok choy and broccoli.  Calcium-fortified foods, such as orange juice, cereal, bread, soy beverages, and tofu products.  Nuts, such as almonds.  Follow these recommended amounts for daily calcium intake:  Children, age 1?3: 700 mg.  Children, age 4?8: 1,000 mg.  Children, age 9?13: 1,300 mg.  Teens, age 14?18: 1,300 mg.  Adults, age 19?50: 1,000 mg.  Adults, age 51?70: ? Men: 1,000 mg. ? Women: 1,200 mg.  Adults, age 71 or older: 1,200 mg.  Pregnant and breastfeeding females: ? Teens: 1,300 mg. ? Adults: 1,000 mg.  Vitamin D Recommendations Vitamin D is the most essential vitamin for bone health. It helps the body to absorb calcium. Sunlight stimulates the skin to make vitamin D, so be sure to get enough sunlight. If you live in a cold climate or you do not get outside often, your health care provider may recommend that you take vitamin   D supplements. Good sources of vitamin D in your diet include:  Egg yolks.  Saltwater fish.  Milk and cereal fortified with vitamin D.  Follow these recommended amounts for daily vitamin D intake:  Children and teens, age 1?18: 600 international units.  Adults, age 50 or younger: 400-800 international units.  Adults, age 51 or older: 800-1,000 international units.  Other Nutrients Other nutrients  for bone health include:  Phosphorus. This mineral is found in meat, poultry, dairy foods, nuts, and legumes. The recommended daily intake for adult men and adult women is 700 mg.  Magnesium. This mineral is found in seeds, nuts, dark green vegetables, and legumes. The recommended daily intake for adult men is 400?420 mg. For adult women, it is 310?320 mg.  Vitamin K. This vitamin is found in green leafy vegetables. The recommended daily intake is 120 mg for adult men and 90 mg for adult women.  What type of physical activity is best for building and maintaining healthy bones? Weight-bearing and strength-building activities are important for building and maintaining peak bone mass. Weight-bearing activities cause muscles and bones to work against gravity. Strength-building activities increases muscle strength that supports bones. Weight-bearing and muscle-building activities include:  Walking and hiking.  Jogging and running.  Dancing.  Gym exercises.  Lifting weights.  Tennis and racquetball.  Climbing stairs.  Aerobics.  Adults should get at least 30 minutes of moderate physical activity on most days. Children should get at least 60 minutes of moderate physical activity on most days. Ask your health care provide what type of exercise is best for you. Where can I find more information? For more information, check out the following websites:  National Osteoporosis Foundation: http://nof.org/learn/basics  National Institutes of Health: http://www.niams.nih.gov/Health_Info/Bone/Bone_Health/bone_health_for_life.asp  This information is not intended to replace advice given to you by your health care provider. Make sure you discuss any questions you have with your health care provider. Document Released: 05/21/2003 Document Revised: 09/18/2015 Document Reviewed: 03/05/2014 Elsevier Interactive Patient Education  2018 Elsevier Inc.  

## 2017-12-05 ENCOUNTER — Other Ambulatory Visit: Payer: Self-pay | Admitting: *Deleted

## 2017-12-06 ENCOUNTER — Telehealth: Payer: Self-pay

## 2017-12-06 NOTE — Telephone Encounter (Signed)
Attempt to contact pt regarding msg asking about bone density scan appt and need for colonoscopy.  No answer.  msg left for pt to return call.

## 2017-12-07 ENCOUNTER — Other Ambulatory Visit: Payer: Self-pay | Admitting: Adult Health

## 2017-12-07 DIAGNOSIS — Z1211 Encounter for screening for malignant neoplasm of colon: Secondary | ICD-10-CM

## 2018-01-02 ENCOUNTER — Telehealth: Payer: Self-pay

## 2018-01-02 ENCOUNTER — Telehealth: Payer: Self-pay | Admitting: *Deleted

## 2018-01-02 NOTE — Telephone Encounter (Signed)
Spoke with patient regarding previous phone message.  Patient was encouraged by previous nurse to call regarding bone density scan due to order being in system.  Patient voiced understanding.  Patient continues with "twitching" sensation.  Patient states she has tried change in diet and hydration as discussed at previous visit.  Twitching sensation continues, however has not worsened.  Patient voices she is okay with seeing neurology if recommended.  Will forward to provider.

## 2018-01-02 NOTE — Telephone Encounter (Signed)
She has been referred to GI, and we have ordered bone density.  This was followed up by Cecille Rubin a few weeks ago.  Elmyra Ricks also called her on 9/25 and left her a message.  Do we have the correct phone number for her?   After reviewing my last note, she and I briefly reviewed the possibility of her seeing neurology for this weird "twitching" sensation she has been experiencing, but she was planning on doing some things such as focusing on hydration, dietary intake.  Has something changed?  Is anything worse?

## 2018-01-02 NOTE — Telephone Encounter (Signed)
Received call from pt stating that she was supposed to have a bone density & hasn't heard from anyone.  [facility] since [facility] is [facility].  She also asked about referral to neurologist & to GI MD for routine colonoscopy.  Message to Lindsey/Dr Magrinat to see if Referrals need to be made by [place].

## 2018-01-03 ENCOUNTER — Other Ambulatory Visit: Payer: Self-pay | Admitting: Adult Health

## 2018-01-03 ENCOUNTER — Other Ambulatory Visit: Payer: Self-pay | Admitting: Oncology

## 2018-01-03 DIAGNOSIS — R253 Fasciculation: Secondary | ICD-10-CM

## 2018-01-03 NOTE — Telephone Encounter (Signed)
Please call patient and ask her if she wants to see neurology.  Happy to refer.

## 2018-01-10 ENCOUNTER — Encounter: Payer: Self-pay | Admitting: Neurology

## 2018-01-17 ENCOUNTER — Other Ambulatory Visit: Payer: Self-pay | Admitting: Student in an Organized Health Care Education/Training Program

## 2018-02-27 ENCOUNTER — Ambulatory Visit (INDEPENDENT_AMBULATORY_CARE_PROVIDER_SITE_OTHER): Payer: BLUE CROSS/BLUE SHIELD

## 2018-02-27 DIAGNOSIS — Z23 Encounter for immunization: Secondary | ICD-10-CM

## 2018-03-12 DIAGNOSIS — C50412 Malignant neoplasm of upper-outer quadrant of left female breast: Secondary | ICD-10-CM | POA: Diagnosis not present

## 2018-03-20 ENCOUNTER — Ambulatory Visit (INDEPENDENT_AMBULATORY_CARE_PROVIDER_SITE_OTHER): Payer: BLUE CROSS/BLUE SHIELD | Admitting: Student in an Organized Health Care Education/Training Program

## 2018-03-20 VITALS — BP 140/85 | HR 87 | Temp 98.5°F | Wt 195.0 lb

## 2018-03-20 DIAGNOSIS — R7303 Prediabetes: Secondary | ICD-10-CM | POA: Diagnosis not present

## 2018-03-20 DIAGNOSIS — G629 Polyneuropathy, unspecified: Secondary | ICD-10-CM

## 2018-03-20 LAB — POCT GLYCOSYLATED HEMOGLOBIN (HGB A1C): Hemoglobin A1C: 6.2 % — AB (ref 4.0–5.6)

## 2018-03-20 NOTE — Patient Instructions (Signed)
It was a pleasure seeing you today in our clinic.  Here is the treatment plan we have discussed and agreed upon together:  We drew blood work at today's visit. I will call or send you a letter with these results. If you do not hear from me within the next week, please give our office a call.  Please follow up with me in one month.   Our clinic's number is [phone]. Please call with questions or concerns about what we discussed today.  Be well, Dr. Burr Medico

## 2018-03-20 NOTE — Progress Notes (Signed)
Subjective:    Tanya [name] - 63 y.o. female MRN [id number]  Date of birth: 1955/06/09  HPI  Tanya [name] is here for a check up and endorses paresthesias in her hands and legs.  She has a history of breast cancer and reports that she completed breast cancer treatment chemotherapy, surgery, radiation. She continues to take arimidex. Follows regularly with oncology.   Paresthesias Patient endorses numbness and tingling that is intermittent in her hands and feet in a stocking-glove distribution. She has noticed this over several months. She has not noticed any weakness in the hands or feet. No rashes. No fevers or weight loss. No N/V/D/C. No swelling in the legs. She has a history of prediabetes but last A1c was <6.   Health Maintenance:  - Colonoscopy form provided today Health Maintenance Due  Topic Date Due  . COLONOSCOPY  02/26/2006    -  reports that she has never smoked. She has never used smokeless tobacco. - Review of Systems: Per HPI. - Past Medical History: Patient Active Problem List   Diagnosis Date Noted  . Malignant neoplasm of upper-outer quadrant of left breast in female, estrogen receptor positive ([place]) 05/04/2016  . Encounter for preventative adult health care examination 01/26/2016  . Prediabetes 06/16/2014  . Blurry vision, bilateral 06/16/2014  . Seasonal allergies 05/14/2014  . Murmur 02/25/2013  . Dyspnea on exertion 11/29/2012  . Hyperlipidemia 02/10/2009  . Obesity 02/10/2009  . Anxiety state 02/10/2009  . HYPERTENSION, BENIGN ESSENTIAL 06/01/2006   - Medications: reviewed and updated Current Outpatient Medications  Medication Sig Dispense Refill  . anastrozole (ARIMIDEX) 1 MG tablet Take 1 tablet (1 mg total) by mouth daily. 90 tablet 4  . ketoconazole (NIZORAL) 2 % cream Apply 1 application topically daily. (Patient taking differently: Apply 1 application topically daily. As needed) 15 g 0  . losartan (COZAAR) 50 MG tablet TAKE 1 TABLET (50  MG TOTAL) BY MOUTH DAILY. 90 tablet 3  . Multiple Vitamin (MULTIVITAMIN WITH MINERALS) TABS tablet Take 1 tablet by mouth 3 (three) times a week. One-A-Day Women's 50+    . naproxen (NAPROSYN) 500 MG tablet Take 1 tablet (500 mg total) by mouth 2 (two) times daily with a meal. (Patient taking differently: Take 500 mg by mouth 2 (two) times daily with a meal. As needed) 10 tablet 0  . nystatin (MYCOSTATIN/NYSTOP) powder 1 APPLICATION TWICE DAILY  1  . pravastatin (PRAVACHOL) 40 MG tablet TAKE 1 TABLET BY MOUTH AT BEDTIME 90 tablet 2   No current facility-administered medications for this visit.    Review of Systems See HPI     Objective:   Physical Exam BP 140/85   Pulse 87   Temp 98.5 F (36.9 C)   Wt 195 lb (88.5 kg)   SpO2 99%   BMI 34.54 kg/m  GEN: NAD, alert, cooperative, and pleasant. RESPIRATORY: Comfortable work of breathing, speaks in full sentences CV: Regular rate noted, distal extremities well perfused and warm without edema GI: Soft, nondistended SKIN: warm and dry, no rashes or lesions NEURO: II-XII grossly intact, negative Tinels MSK: Moves 4 extremities equally PSYCH: AAOx3, appropriate affect    Assessment & Plan:   1. 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 Negative signs for carpel tunnel. Will check A1c for diabetes, will check CBC for hemoglobin, check CMP and B12 for other etiologies of neuropathy. - Vitamin B12 - CBC - Comprehensive metabolic panel - check X9B  2. Preventative health care - colonoscopy  form provided  Everrett Coombe, MD,MS,  PGY3 03/21/2018 4:20 PM

## 2018-03-21 ENCOUNTER — Encounter: Payer: Self-pay | Admitting: Student in an Organized Health Care Education/Training Program

## 2018-03-21 LAB — CBC
HEMATOCRIT: 39.3 % (ref 34.0–46.6)
Hemoglobin: 13 g/dL (ref 11.1–15.9)
MCH: 26.2 pg — ABNORMAL LOW (ref 26.6–33.0)
MCHC: 33.1 g/dL (ref 31.5–35.7)
MCV: 79 fL (ref 79–97)
Platelets: 190 10*3/uL (ref 150–450)
RBC: 4.97 x10E6/uL (ref 3.77–5.28)
RDW: 14.8 % (ref 11.7–15.4)
WBC: 5.9 10*3/uL (ref 3.4–10.8)

## 2018-03-21 LAB — COMPREHENSIVE METABOLIC PANEL
ALT: 19 IU/L (ref 0–32)
AST: 18 IU/L (ref 0–40)
Albumin/Globulin Ratio: 1.8 (ref 1.2–2.2)
Albumin: 4.3 g/dL (ref 3.6–4.8)
Alkaline Phosphatase: 81 IU/L (ref 39–117)
BUN/Creatinine Ratio: 16 (ref 12–28)
BUN: 15 mg/dL (ref 8–27)
Bilirubin Total: 0.3 mg/dL (ref 0.0–1.2)
CO2: 24 mmol/L (ref 20–29)
CREATININE: 0.92 mg/dL (ref 0.57–1.00)
Calcium: 9.5 mg/dL (ref 8.7–10.3)
Chloride: 103 mmol/L (ref 96–106)
GFR, EST AFRICAN AMERICAN: 77 mL/min/{1.73_m2} (ref >59)
GFR, EST NON AFRICAN AMERICAN: 67 mL/min/{1.73_m2} (ref >59)
GLOBULIN, TOTAL: 2.4 g/dL (ref 1.5–4.5)
GLUCOSE: 83 mg/dL (ref 65–99)
Potassium: 4.3 mmol/L (ref 3.5–5.2)
SODIUM: 143 mmol/L (ref 134–144)
TOTAL PROTEIN: 6.7 g/dL (ref 6.0–8.5)

## 2018-03-21 LAB — VITAMIN B12: Vitamin B-12: 407 pg/mL (ref 232–1245)

## 2018-03-21 LAB — LIPID PANEL
CHOLESTEROL TOTAL: 165 mg/dL (ref 100–199)
Chol/HDL Ratio: 2.8 ratio (ref 0.0–4.4)
HDL: 59 mg/dL (ref >39)
LDL Calculated: 84 mg/dL (ref 0–99)
TRIGLYCERIDES: 112 mg/dL (ref 0–149)
VLDL CHOLESTEROL CAL: 22 mg/dL (ref 5–40)

## 2018-03-23 ENCOUNTER — Other Ambulatory Visit: Payer: Self-pay | Admitting: Student in an Organized Health Care Education/Training Program

## 2018-03-23 ENCOUNTER — Telehealth: Payer: Self-pay | Admitting: Student in an Organized Health Care Education/Training Program

## 2018-03-23 NOTE — Telephone Encounter (Signed)
Patient returned call. Would like a call around 4 pm on Monday if possible.  Call back is [phone]  Danley Danker, RN [facility] [place])

## 2018-03-23 NOTE — Telephone Encounter (Signed)
Called patient to discuss labs. Lipids elevated, would be reasonable to transition to high intensity statin. LVM for her to call back with a good time that she may be reached to discuss this further.

## 2018-03-26 ENCOUNTER — Other Ambulatory Visit: Payer: Self-pay | Admitting: Student in an Organized Health Care Education/Training Program

## 2018-03-26 DIAGNOSIS — E7800 Pure hypercholesterolemia, unspecified: Secondary | ICD-10-CM

## 2018-03-26 NOTE — Telephone Encounter (Signed)
Requesting to speak with Dr. Burr Medico.

## 2018-03-26 NOTE — Telephone Encounter (Signed)
Called to discuss lipid panel results with the patient. SHe would like to retest a fasting lipid panel prior to making any medication changes. She will schedule a lab visit through the front office.

## 2018-03-27 ENCOUNTER — Telehealth: Payer: Self-pay | Admitting: Student in an Organized Health Care Education/Training Program

## 2018-03-27 NOTE — Telephone Encounter (Signed)
Pt called and she wants to ask Dr. Annamaria Boots if she can extend her fasting/cholesterol pill. Please call her back at [phone]. ad

## 2018-03-30 ENCOUNTER — Ambulatory Visit: Payer: BLUE CROSS/BLUE SHIELD | Admitting: Neurology

## 2018-04-26 ENCOUNTER — Other Ambulatory Visit: Payer: BLUE CROSS/BLUE SHIELD

## 2018-04-26 DIAGNOSIS — E7800 Pure hypercholesterolemia, unspecified: Secondary | ICD-10-CM | POA: Diagnosis not present

## 2018-04-27 ENCOUNTER — Encounter: Payer: Self-pay | Admitting: Student in an Organized Health Care Education/Training Program

## 2018-04-27 LAB — LIPID PANEL
CHOLESTEROL TOTAL: 152 mg/dL (ref 100–199)
Chol/HDL Ratio: 2.7 ratio (ref 0.0–4.4)
HDL: 56 mg/dL (ref >39)
LDL CALC: 78 mg/dL (ref 0–99)
Triglycerides: 92 mg/dL (ref 0–149)
VLDL CHOLESTEROL CAL: 18 mg/dL (ref 5–40)

## 2018-05-02 ENCOUNTER — Telehealth: Payer: Self-pay | Admitting: Oncology

## 2018-05-02 NOTE — Telephone Encounter (Signed)
GM CME 3/10 - moved from 3/10 to 3/30

## 2018-05-21 NOTE — Progress Notes (Signed)
[facility] Clinic Note - Initial Visit   Date: 05/22/18  Tanya [name] MRN: [id number] DOB: 07/02/1955   Dear Dr. Jabier Gauss:  Thank you for your kind referral of Tanya [name] for consultation of twitching. Although her history is well known to you, please allow [place] to reiterate it for the purpose of our medical record. The patient was accompanied to the clinic by self.   History of Present Illness: Tanya [name] is a 63 y.o. right-handed African American female with left breast cancer s/p lumpectomy and chemotherapy (2018), hypertension, and hyperlipidemia presenting for evaluation of muscle twitching.  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  During her chemotherapy, she developed numbness of the hands and feet which slowly improved.  Since this time, she is has noticed episodic and random sharp stabbing pain over the entire body - arms, legs, eyes, torso.  This occurs only for a brief second and does not cause severe pain, but is more alarming. Stress and anxiety exacerbates her symptoms.  She is extremely worried that she has something very bad causing her symptoms and is asking if there is a full-body scan, such as a PET scan.  She admits to thinking about these symptoms all the time and is loosing sleep because of it.  She is very worried and has insight into her anxiety.  No associated weakness, headaches, or numbness/tingling.    Out-side paper records, electronic medical record, and images have been reviewed where available and summarized as:  Lab Results  Component Value Date   HGBA1C 6.2 (A) 03/20/2018   Lab Results  Component Value Date   [id number] 03/20/2018     Past Medical History:  Diagnosis Date  . ANXIETY   . Arthritis   . Dyspnea on exertion   . HYPERLIPIDEMIA   . HYPERTENSION, BENIGN ESSENTIAL   . Malignant neoplasm of upper-outer quadrant of left breast in female, estrogen receptor positive ([place]) 05/04/2016  . OBESITY   .  Personal history of chemotherapy   . Personal history of radiation therapy   . Pre-diabetes    no medications    Past Surgical History:  Procedure Laterality Date  . BREAST LUMPECTOMY Left 10/2016  . BREAST LUMPECTOMY WITH RADIOACTIVE SEED AND SENTINEL LYMPH NODE BIOPSY Left 11/03/2016   Procedure: INJECT BLUE DYE LEFT BREAST, LEFT BREAST RADIOACTIVE SEED GUIDED LUMPECTOMY WITH LEFT SENTINEL LYMPH NODE BIOPSY AND RADIOACTIVE SEED TARGETED AXILLARY LYMPH NODE EXCISION;  Surgeon: Rolm Bookbinder, MD;  Location: [facility];  Service: General;  Laterality: Left;  . CESAREAN SECTION    . PORT-A-CATH REMOVAL Right 11/03/2016   Procedure: REMOVAL PORT-A-CATH;  Surgeon: Rolm Bookbinder, MD;  Location: [facility];  Service: General;  Laterality: Right;  . PORTACATH PLACEMENT Right 06/02/2016   Procedure: INSERTION PORT-A-CATH WITH ULTRASOUND GUIDANCE;  Surgeon: Rolm Bookbinder, MD;  Location: [facility];  Service: General;  Laterality: Right;  . TUBAL LIGATION       Medications:  Outpatient Encounter Medications as of 05/22/2018  Medication Sig  . anastrozole (ARIMIDEX) 1 MG tablet Take 1 tablet (1 mg total) by mouth daily.  Marland Kitchen losartan (COZAAR) 50 MG tablet TAKE 1 TABLET (50 MG TOTAL) BY MOUTH DAILY.  . Multiple Vitamin (MULTIVITAMIN WITH MINERALS) TABS tablet Take 1 tablet by mouth 3 (three) times a week. One-A-Day Women's 50+  . pravastatin (PRAVACHOL) 40 MG tablet TAKE 1 TABLET BY MOUTH AT BEDTIME  . [DISCONTINUED] ketoconazole (NIZORAL) 2 % cream Apply 1 application topically daily. (Patient taking  differently: Apply 1 application topically daily. As needed)  . [DISCONTINUED] naproxen (NAPROSYN) 500 MG tablet Take 1 tablet (500 mg total) by mouth 2 (two) times daily with a meal. (Patient taking differently: Take 500 mg by mouth 2 (two) times daily with a meal. As needed)  . [DISCONTINUED] nystatin (MYCOSTATIN/NYSTOP) powder 1 APPLICATION TWICE DAILY   No facility-administered  encounter medications on file as of 05/22/2018.     Allergies:  Allergies  Allergen Reactions  . Ascorbic Acid & Derivatives Other (See Comments)    Mouth sores (Vitamin C) with higher doses  . Camoquin [Amodiaquine] Itching    Family History: Family History  Problem Relation Age of Onset  . Breast cancer Mother 56    Social History: Social History   Tobacco Use  . Smoking status: Never Smoker  . Smokeless tobacco: Never Used  Substance Use Topics  . Alcohol use: Yes    Comment: rarely  . Drug use: No   Social History   Social History Narrative   Lives with husband in a split level home.  Works at [employer].  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    Education: Masters.      Review of Systems:  CONSTITUTIONAL: No fevers, chills, night sweats, or weight loss.   EYES: No visual changes or eye pain ENT: No hearing changes.  No history of nose bleeds.   RESPIRATORY: No cough, wheezing and shortness of breath.   CARDIOVASCULAR: Negative for chest pain, and palpitations.   GI: Negative for abdominal discomfort, blood in stools or black stools.  No recent change in bowel habits.   GU:  No history of incontinence.   MUSCLOSKELETAL: No history of joint pain or swelling.  No myalgias.   SKIN: Negative for lesions, rash, and itching.   HEMATOLOGY/ONCOLOGY: Negative for prolonged bleeding, bruising easily, and swollen nodes.  +history of cancer.   ENDOCRINE: Negative for cold or heat intolerance, polydipsia or goiter.   PSYCH:  No depression +anxiety symptoms.   NEURO: As Above.   Vital Signs:  BP 120/84   Pulse (!) 107   Ht 5\' 3"  (1.6 m)   Wt 191 lb 4 oz (86.8 kg)   SpO2 100%   BMI 33.88 kg/m    General Medical Exam:   General:  Well appearing, comfortable.   Eyes/ENT: see cranial nerve examination.   Neck:   No carotid bruits. Respiratory:  Clear to auscultation, good air entry bilaterally.   Cardiac:  Regular rate and rhythm, no murmur.   Extremities:  No deformities, edema, or skin  discoloration.  Skin:  No rashes or lesions.  Neurological Exam: MENTAL STATUS including orientation to time, place, person, recent and remote memory, attention span and concentration, language, and fund of knowledge is normal.  Speech is not dysarthric, strong [place] accent.  CRANIAL NERVES: II:  No visual field defects.  Unremarkable fundi.   III-IV-VI: Pupils equal round and reactive to light.  Normal conjugate, extra-ocular eye movements in all directions of gaze.  No nystagmus.  No ptosis.   V:  Normal facial sensation.    VII:  Normal facial symmetry and movements.   VIII:  Normal hearing and vestibular function.   IX-X:  Normal palatal movement.   XI:  Normal shoulder shrug and head rotation.   XII:  Normal tongue strength and range of motion, no deviation or fasciculation.  MOTOR:  No atrophy, fasciculations or abnormal movements.  No pronator drift.   Upper Extremity:  Right  Left  Deltoid  5/5   5/5   Biceps  5/5   5/5   Triceps  5/5   5/5   Infraspinatus 5/5  5/5  Medial pectoralis 5/5  5/5  Wrist extensors  5/5   5/5   Wrist flexors  5/5   5/5   Finger extensors  5/5   5/5   Finger flexors  5/5   5/5   Dorsal interossei  5/5   5/5   Abductor pollicis  5/5   5/5   Tone (Ashworth scale)  0  0   Lower Extremity:  Right  Left  Hip flexors  5/5   5/5   Hip extensors  5/5   5/5   Adductor 5/5  5/5  Abductor 5/5  5/5  Knee flexors  5/5   5/5   Knee extensors  5/5   5/5   Dorsiflexors  5/5   5/5   Plantarflexors  5/5   5/5   Toe extensors  5/5   5/5   Toe flexors  5/5   5/5   Tone (Ashworth scale)  0  0   MSRs:  Right        Left                  brachioradialis 2+  2+  biceps 2+  2+  triceps 2+  2+  patellar 2+  2+  ankle jerk 2+  2+  Hoffman no  no  plantar response down  down   SENSORY:  Normal and symmetric perception of light touch, pinprick, vibration, and proprioception.  Romberg's sign absent.   COORDINATION/GAIT: Normal finger-to- nose-finger  and heel-to-shin.  Intact rapid alternating movements bilaterally.  Gait narrow based and stable. Tandem and stressed gait intact.    IMPRESSION: Migratory paresthesias due to stress/anxiety. Symptoms do not conform to a cutaneous nerve or dermatomal distrubution.  Normal neurological exam makes worrisome pathology very unlikely.  She admits to having significant anxiety over her health, especially following the diagnosis of breast cancer.  She has not seen a counselor and is not being treated for anxiety.  I had a lengthy discussion with patient regarding anxiety/stress as a manifestation of somatic complaints and the key is to address underlying anxiety. There is no need for neurological testing.  Much of today's visit was spent reassuring patient.  All questions were answered.  She was advised to follow-up with her PCP to address anxiety with medication and/or consider counseling.   Thank you for allowing me to participate in patient's care.  If I can answer any additional questions, I would be pleased to do so.    Sincerely,    Donika K. Posey Pronto, DO

## 2018-05-22 ENCOUNTER — Ambulatory Visit: Payer: BLUE CROSS/BLUE SHIELD | Admitting: Oncology

## 2018-05-22 ENCOUNTER — Encounter: Payer: Self-pay | Admitting: Neurology

## 2018-05-22 ENCOUNTER — Encounter

## 2018-05-22 ENCOUNTER — Ambulatory Visit: Payer: BLUE CROSS/BLUE SHIELD | Admitting: Neurology

## 2018-05-22 ENCOUNTER — Other Ambulatory Visit: Payer: BLUE CROSS/BLUE SHIELD

## 2018-05-22 VITALS — BP 120/84 | HR 107 | Ht 63.0 in | Wt 191.2 lb

## 2018-05-22 DIAGNOSIS — F418 Other specified anxiety disorders: Secondary | ICD-10-CM | POA: Diagnosis not present

## 2018-05-22 DIAGNOSIS — R202 Paresthesia of skin: Secondary | ICD-10-CM

## 2018-05-22 NOTE — Patient Instructions (Signed)
Please follow-up with your primary care doctor or oncologist about anxiety.  You may need to take a medication to help or seeing a counselor.

## 2018-06-02 ENCOUNTER — Other Ambulatory Visit: Payer: Self-pay | Admitting: Oncology

## 2018-06-02 ENCOUNTER — Other Ambulatory Visit: Payer: Self-pay | Admitting: Student in an Organized Health Care Education/Training Program

## 2018-06-03 IMAGING — MG NEEDLE LOCALIZATION OF THE LEFT BREAST WITH MAMMO GUIDANCE
5 series · 5 of 5 positions shown · non-contrast
Comparison: Previous exam(s).

CLINICAL DATA: Patient for preoperative localization prior to left
breast lumpectomy and lymph node removal.

EXAM:
MAMMOGRAPHIC GUIDED RADIOACTIVE SEED LOCALIZATION OF THE LEFT BREAST

[L LM (1 of 3)]
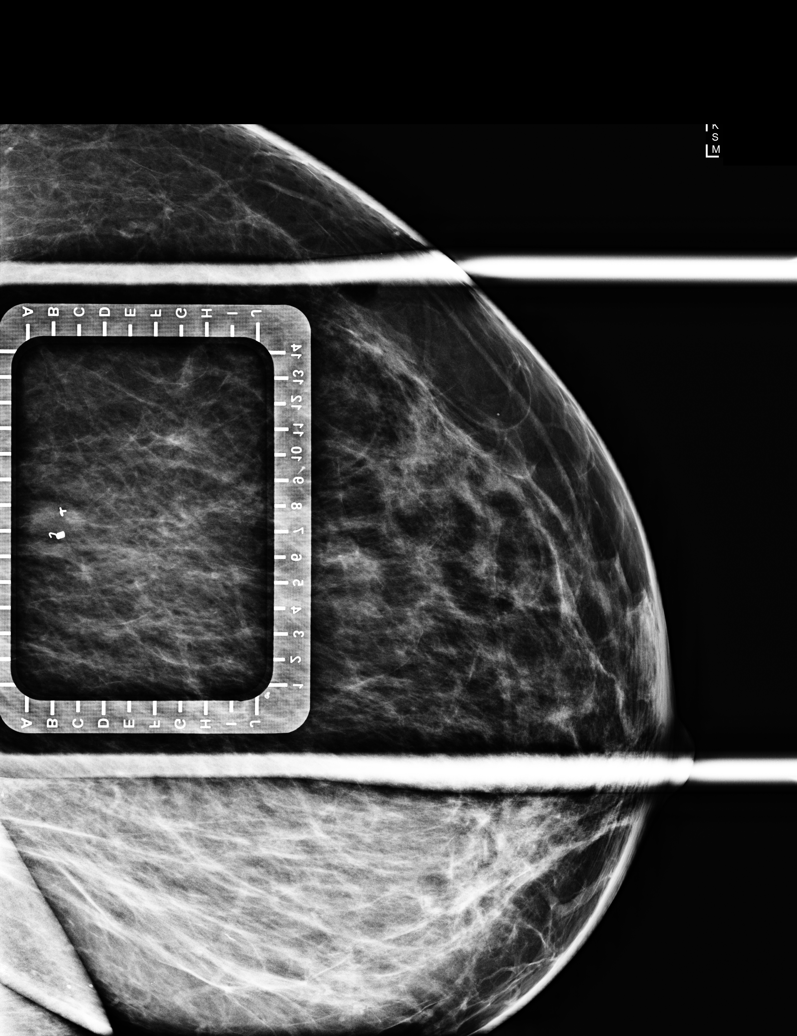

[L LM (2 of 3)]
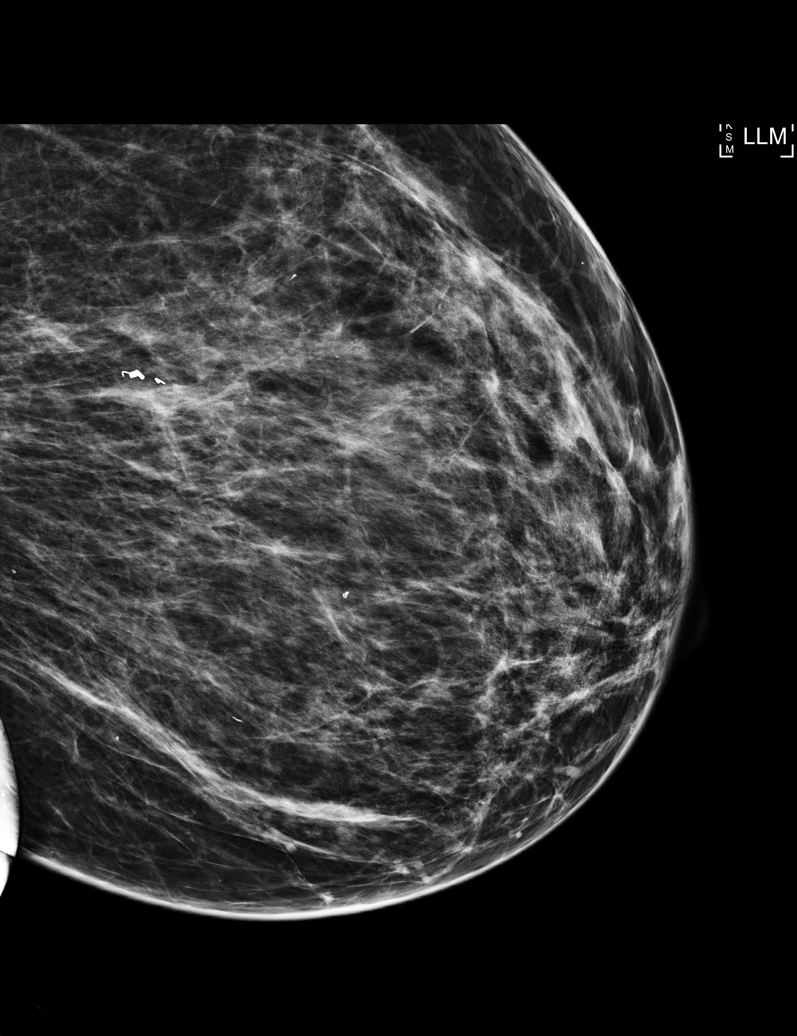

[L CC (1 of 2)]
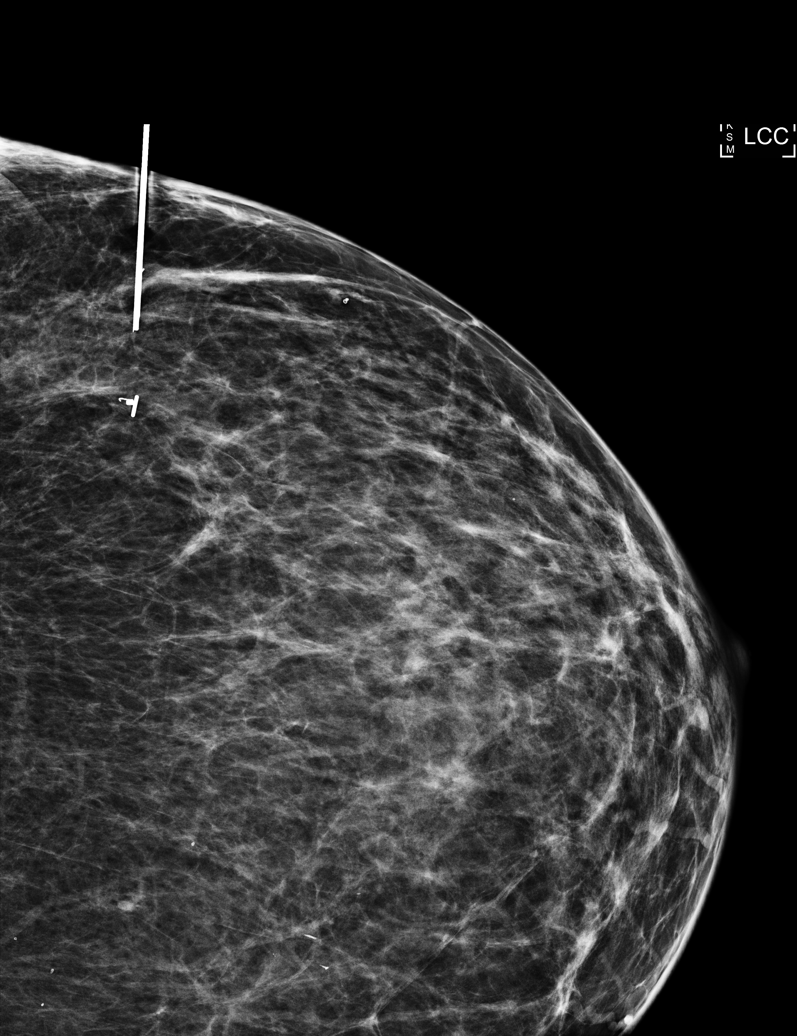

[L LM (3 of 3)]
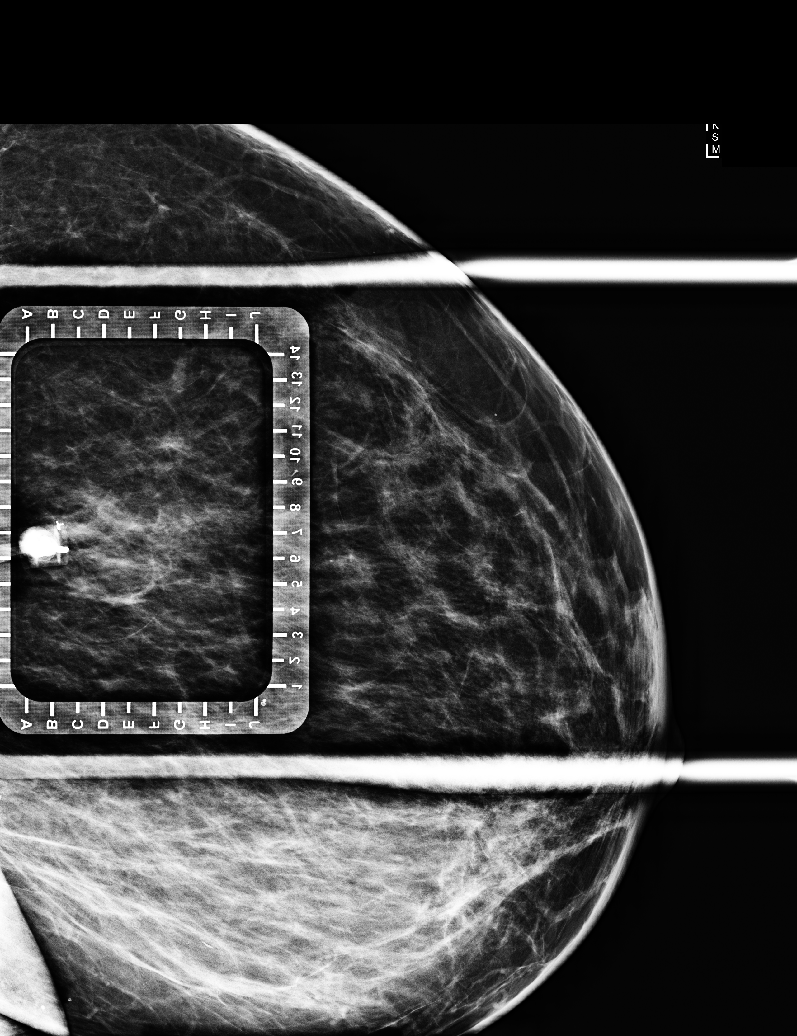

[L CC (2 of 2)]
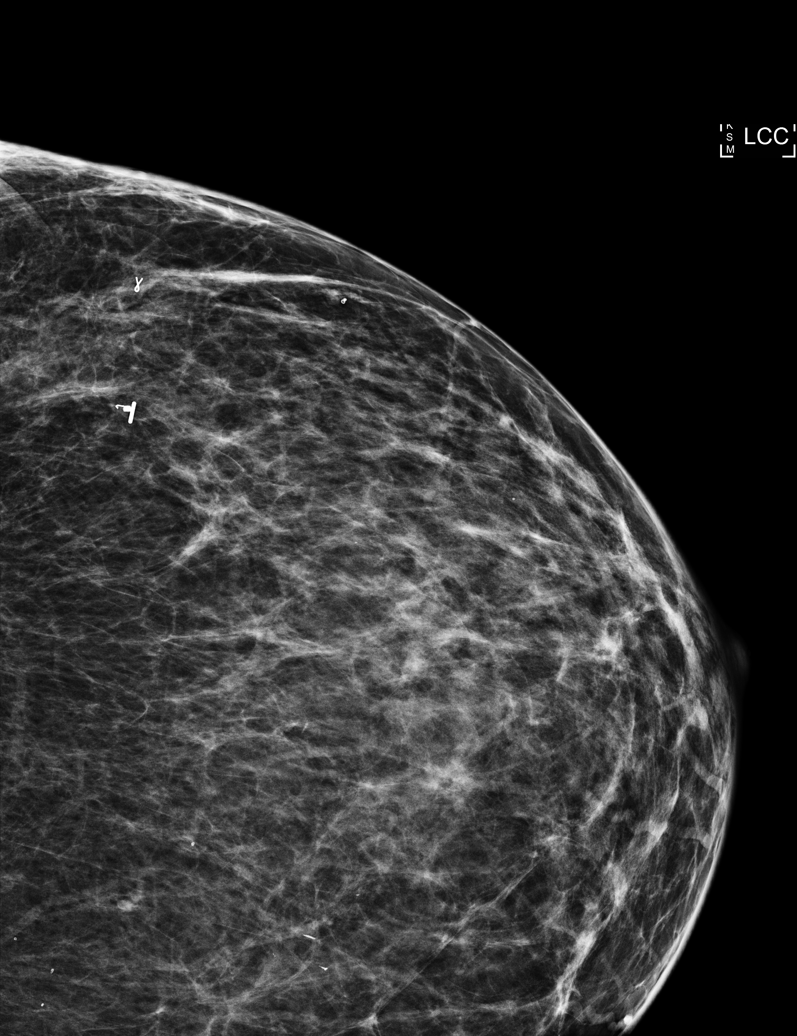

[5 of 5 positions shown; findings below may reference images not displayed]

FINDINGS: Patient presents for radioactive seed localization prior to left
breast lumpectomy. I met with the patient and we discussed the
procedure of seed localization including benefits and alternatives.
We discussed the high likelihood of a successful procedure. We
discussed the risks of the procedure including infection, bleeding,
tissue injury and further surgery. We discussed the low dose of
radioactivity involved in the procedure. Informed, written consent
was given.

The usual time-out protocol was performed immediately prior to the
procedure.

Using mammographic guidance, sterile technique, 1% lidocaine and an
T-STK radioactive seed, coil shaped marking clip within the lateral
left breast was localized using a lateral approach. The follow-up
mammogram images confirm the seed in the expected location and were
marked for Dr. Ulices.

Follow-up survey of the patient confirms presence of the radioactive
seed.

Order number of T-STK seed:  [id number].

Total activity:  0.248 millicuries  Reference Date: 10/20/2016

The patient tolerated the procedure well and was released from the
[REDACTED]. She was given instructions regarding seed removal.
IMPRESSION: Radioactive seed localization left breast. No apparent
complications.

## 2018-06-04 ENCOUNTER — Telehealth: Payer: Self-pay | Admitting: Oncology

## 2018-06-04 NOTE — Telephone Encounter (Signed)
Called regarding 9/8 °

## 2018-06-08 ENCOUNTER — Ambulatory Visit: Payer: BLUE CROSS/BLUE SHIELD | Admitting: Neurology

## 2018-06-11 ENCOUNTER — Other Ambulatory Visit: Payer: BLUE CROSS/BLUE SHIELD

## 2018-06-11 ENCOUNTER — Ambulatory Visit: Payer: BLUE CROSS/BLUE SHIELD | Admitting: Oncology

## 2018-07-24 ENCOUNTER — Other Ambulatory Visit: Payer: Self-pay | Admitting: Oncology

## 2018-07-24 DIAGNOSIS — Z853 Personal history of malignant neoplasm of breast: Secondary | ICD-10-CM

## 2018-08-09 ENCOUNTER — Other Ambulatory Visit: Payer: Self-pay

## 2018-08-09 ENCOUNTER — Telehealth (INDEPENDENT_AMBULATORY_CARE_PROVIDER_SITE_OTHER): Payer: BLUE CROSS/BLUE SHIELD | Admitting: Student in an Organized Health Care Education/Training Program

## 2018-08-09 DIAGNOSIS — G4700 Insomnia, unspecified: Secondary | ICD-10-CM | POA: Insufficient documentation

## 2018-08-09 DIAGNOSIS — F5101 Primary insomnia: Secondary | ICD-10-CM | POA: Diagnosis not present

## 2018-08-09 MED ORDER — TRAZODONE HCL 50 MG PO TABS: 25.0000 mg | ORAL_TABLET | Freq: Every evening | ORAL | 0 refills | Status: DC | PRN

## 2018-08-09 NOTE — Assessment & Plan Note (Addendum)
Acute worsening of chronic condition with daytime fatigue - counseled patient on sleep hygiene practices ie sleeping in bed only without naps, limiting screen time/lights/noises at bedtime. - recommended patient participate in CBT - continue to use melatonin - discussed medication options with patient and she was agreeable to a dual treatment medication so we will trial trazodone at bedtime for help with mood and insomnia. Would recommend close follow up with PCP for response. Would also monitor BP as trazodone has moderate risk of interaction with losartan and lowering BP

## 2018-08-09 NOTE — Progress Notes (Signed)
[facility] Telemedicine Visit  Patient consented to have virtual visit. Method of visit: Video was attempted, but technology challenges prevented patient from using video, so visit was conducted via telephone.  Encounter participants: Patient: Tanya [name] - located at home Provider: Richarda Osmond - located at home office  Chief Complaint: chronic insomnia with acute worsening x1 week  HPI: 1 week 2.5 hours /day Sleep walking last week- to bathroom and fell.  Tired during day. Patient states that she has had poor sleep quality for several years with average 4-5 hours per day. She has also slept walk on several occasions and talks in her sleep regularly. For the past week, patient has had decreased sleep to about 2.5 hours per day including naps during the day.  Increased sleep latency- lays down at about 11-12pm and lays awake for several hours.  Poor sleep maintenance- endorses sleeping for only about 30 minutes at a time.  Increased fatigue during the daytime. Endorses sleep walking x1 with recollection of the time she was doing it and resulted in her tripping and falling in the bathroom with only minor injury to leg. She has been taking 5mg  melatonin without improvement. No other new medications. 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 She has never had counseling in regards to her insomnia. Denies any headache, visual changes, N/V, focal weakness, incontinence.  ROS: per HPI  Pertinent PMHx: chronic insomnia, poorly controlled anxiety, h/o breast cancer- on anastrozole  Exam:  normal tone and mood.  Not short of breath.  Assessment/Plan:  Insomnia Acute worsening of chronic condition with daytime fatigue - counseled patient on sleep hygiene practices ie sleeping in bed only without naps, limiting screen time/lights/noises at bedtime. - recommended patient participate in CBT - continue to use  melatonin - discussed medication options with patient and she was agreeable to a dual treatment medication so we will trial trazodone at bedtime for help with mood and insomnia. Would recommend close follow up with PCP for response. Would also monitor BP as trazodone has moderate risk of interaction with losartan and lowering BP    Time spent during visit with patient: 15 minutes

## 2018-08-14 ENCOUNTER — Telehealth: Payer: Self-pay | Admitting: Licensed Clinical Social Worker

## 2018-08-14 NOTE — Telephone Encounter (Signed)
   Phone Outreach Note  08/14/2018 Name: Tanya [name] MRN: [id number] DOB: 1955-08-12  Referred by: Dr, Ralene Cork Reason for referral : Care Coordination (counseling resources)  An unsuccessful telephone outreach to patient was attempted today ref. The above consult .  Left voice message to call LCSW   Follow Up Plan:  If no return call is received. LCSW will call again in 3 to 7 days.  Casimer Lanius, [facility]   [phone] 10:03 AM

## 2018-08-14 NOTE — Telephone Encounter (Addendum)
Care Coordination  Telephone Outreach Note 08/14/2018  Type of Service: General Social Work Consult via phone Start time: 11:35   End time: 11:55 Total time: 20 minutes  Name: Tanya [name] MRN: [id number] DOB: 11-29-1955 Confirmed patient's address: Yes    Confirmed patient's name  Yes  Type of Visit: Telephonic Patient location: home LCSW  location: home  Tanya [name] is a 63 y.o. female referred by Dr. Ouida Sills for coordinating care and counseling resources.  Review of patient's consultants notes from appropriate care team members was performed as part of care coordination referral.   LCSW return voice message from patient to assess needs and barriers.  Duration of problem/ how impacting : 5 plus years, not impacting daily functions  Recent life changes: working from home Strengths: willing to improve sleep School/Work: works at [employer]. Self-Care: enjoys walking but recently stopped ASSESSMENT: Patient is pleasant and engaged in conversation, currently experiencing increase difficulty with sleep. Reports ongoing difficulty with insomnia in the past 5 years but noticed worsening. Patient has not started taking Trazodone, plans to start. She is taking 10mg  of melatonin.   Patient reports no recent life changed other than quarantine due to COVID-19.  She may benefit from and is in agreement to receive further assessment and therapeutic interventions to assist with managing her symptoms. LCSW will provide brief intervention until she is connected with ongoing therapy. Discussed:  Previous and current coping, things patient enjoys or use to enjoy doing; ongoing counseling options. GOALS: 1. Reduce symptoms of: insomnia 2. Increase knowledge and/or ability of: coping skills and self-management skills   INTERVENTION: Client interviewed and appropriate assessments performed. Provided client with information about sleep hygiene, relaxed breathing and list of therapist ; also  utilized engagement as part of my intervention.  Other interventions include:  Mindfulness or Relaxation Training, Behavioral Activation and Sleep Hygiene,  Referral to Counselor/Psychotherapist .  Plan:  Patient will  1. Walk 2 times per week 2. Call insurance provider for in-network counseling and review list mailed 3. Review sleep hygiene list 4. Relaxed breathing 3 times daily 5. F/U with LCSW in 2 weeks with phone appointment  Everrett Coombe, MD has been notified of this outreach and Ms. [employer] plan.   Casimer Lanius, [facility]   [phone] 12:41 PM

## 2018-08-17 ENCOUNTER — Encounter: Payer: Self-pay | Admitting: Student in an Organized Health Care Education/Training Program

## 2018-08-17 ENCOUNTER — Ambulatory Visit: Payer: BLUE CROSS/BLUE SHIELD | Admitting: Student in an Organized Health Care Education/Training Program

## 2018-08-17 ENCOUNTER — Ambulatory Visit (HOSPITAL_COMMUNITY)
Admission: RE | Admit: 2018-08-17 | Discharge: 2018-08-17 | Disposition: A | Discharge status: Home / Self Care | Payer: BLUE CROSS/BLUE SHIELD | Source: Ambulatory Visit | Attending: Family Medicine | Admitting: Family Medicine

## 2018-08-17 ENCOUNTER — Other Ambulatory Visit: Payer: Self-pay

## 2018-08-17 VITALS — BP 138/92 | HR 124

## 2018-08-17 DIAGNOSIS — R000 Tachycardia, unspecified: Secondary | ICD-10-CM | POA: Insufficient documentation

## 2018-08-17 DIAGNOSIS — R7303 Prediabetes: Secondary | ICD-10-CM | POA: Diagnosis not present

## 2018-08-17 DIAGNOSIS — E119 Type 2 diabetes mellitus without complications: Secondary | ICD-10-CM | POA: Diagnosis not present

## 2018-08-17 DIAGNOSIS — I10 Essential (primary) hypertension: Secondary | ICD-10-CM

## 2018-08-17 LAB — POCT GLYCOSYLATED HEMOGLOBIN (HGB A1C): HbA1c, POC (controlled diabetic range): 6.5 % (ref 0.0–7.0)

## 2018-08-17 MED ORDER — METFORMIN HCL ER 500 MG PO TB24: 500.0000 mg | ORAL_TABLET | Freq: Every day | ORAL | 0 refills | Status: DC

## 2018-08-17 NOTE — Progress Notes (Signed)
CC: Follow up diabetes  HPI: Tanya [name] is a 63 y.o. female with PMH significant for HTN, obesity, murmer, HLD who presents to [facility] today for a blood pressure check  Episodic Dizziness and HTN The patient was in her usual state of health until yesterday when she developed elevated blood pressure and dizziness. Her BP was 856 systolic. She called EMS who deployed to her house and confirmed the elevated BP. She was offered to be taken into the hospital or follow up in clinic today and she elected to follow up with PCP today.  Today, patient reports that BP was on the lower side at 90 systolic when she woke this morning.    Diabetes Patient previously diagnosed with prediabetes. Today her A1c was noted to be 6.5. She reports no polyuria or polydipsia. No blurry vision. She has not been on medications in the past. She is agreeable to lifestyle modifications.   Review of Symptoms:  See HPI for ROS.   CC, SH/smoking status, and VS noted.  Objective: BP (!) 138/92   Pulse (!) 124   SpO2 99%  GEN: NAD, alert, cooperative, and pleasant.  rhinorrhea, no pharyngeal erythema or exudates NECK: full ROM RESPIRATORY: clear to auscultation bilaterally with no wheezes, rhonchi or rales, good effort CV: RRR, no m/r/g, no peripheral edema GI: soft, non-tender, non-distended, no hepatosplenomegaly SKIN: warm and dry, no rashes or lesions NEURO: II-XII grossly intact, normal gait, peripheral sensation intact PSYCH: AAOx3, appropriate affect   Assessment and plan:  Diabetes mellitus without complication ([place]) New diagnosis. Previously prediabetes. Discussed meaning of the HbA1c as average blood sugar over 3 months. Discussed diet and lifestyle changes. Provided dietitian information incase she would like to make an appointment. Engaged in shared decision making and we will start low dose metformin and recheck HbA1c in 3 months. - metFORMIN (GLUCOPHAGE-XR) 500 MG 24 hr tablet; Take 1 tablet  (500 mg total) by mouth daily with breakfast.  Dispense: 90 tablet; Refill: 0   Tachycardia Patient noted to be tachycardic in the office today. She is not experiencing chest pain, palpitations, dizziness or changes in vision. Her BP is normal. Given her recent history of high and low BPs with EMS called yesterday for hypertension and dizziness, we will pursue a workup for tachycardia and dizziness. See separate problem for BP management. - EKG 12-Lead - NSR, HR 95, no afib - CBC - TSH  HYPERTENSION, BENIGN ESSENTIAL Patient had an episode of hypertension yesterday evening associated with dizziness. However, she endorses checking her blood pressure and it was 90 systolic in the morning. She is normotensive in the office today. - referral to Dr. Graylin Shiver clinic for ambulatory 24h  BP monitoring - continue current BP management as she is currently normotensive - CMP - lipid panel - return precautions advised - she should bring her BP cuff to next OV so it can be tested with office cuff    Orders Placed This Encounter  Procedures  . CBC  . Comprehensive metabolic panel    Order Specific Question:   Has the patient fasted?    Answer:   No  . Lipid Panel    Order Specific Question:   Has the patient fasted?    Answer:   No  . TSH  . HgB A1c  . EKG 12-Lead    Meds ordered this encounter  Medications  . metFORMIN (GLUCOPHAGE-XR) 500 MG 24 hr tablet    Sig: Take 1 tablet (500 mg total) by mouth  daily with breakfast.    Dispense:  90 tablet    Refill:  0     Everrett Coombe, MD,MS,  PGY3 08/19/2018 8:31 AM

## 2018-08-17 NOTE — Patient Instructions (Signed)
It was a pleasure seeing you today in our clinic. Here is the treatment plan we have discussed and agreed upon together:  We drew blood work at today's visit. I will call or send you a letter with these results. If you do not hear from me within the next week, please give our office a call.  Our clinic's number is [phone]. Please call with questions or concerns about what we discussed today.  Be well, Dr. Jenniah Bhavsar   

## 2018-08-18 LAB — LIPID PANEL
Chol/HDL Ratio: 3.1 ratio (ref 0.0–4.4)
Cholesterol, Total: 177 mg/dL (ref 100–199)
HDL: 58 mg/dL (ref >39)
LDL Calculated: 104 mg/dL — ABNORMAL HIGH (ref 0–99)
Triglycerides: 75 mg/dL (ref 0–149)
VLDL Cholesterol Cal: 15 mg/dL (ref 5–40)

## 2018-08-18 LAB — CBC
Hematocrit: 41 % (ref 34.0–46.6)
Hemoglobin: 14 g/dL (ref 11.1–15.9)
MCH: 26.5 pg — ABNORMAL LOW (ref 26.6–33.0)
MCHC: 34.1 g/dL (ref 31.5–35.7)
MCV: 78 fL — ABNORMAL LOW (ref 79–97)
Platelets: 181 10*3/uL (ref 150–450)
RBC: 5.28 x10E6/uL (ref 3.77–5.28)
RDW: 14.9 % (ref 11.7–15.4)
WBC: 8.9 10*3/uL (ref 3.4–10.8)

## 2018-08-18 LAB — COMPREHENSIVE METABOLIC PANEL
ALT: 15 IU/L (ref 0–32)
AST: 14 IU/L (ref 0–40)
Albumin/Globulin Ratio: 1.7 (ref 1.2–2.2)
Albumin: 4.5 g/dL (ref 3.8–4.8)
Alkaline Phosphatase: 82 IU/L (ref 39–117)
BUN/Creatinine Ratio: 17 (ref 12–28)
BUN: 14 mg/dL (ref 8–27)
Bilirubin Total: 0.4 mg/dL (ref 0.0–1.2)
CO2: 21 mmol/L (ref 20–29)
Calcium: 9.8 mg/dL (ref 8.7–10.3)
Chloride: 106 mmol/L (ref 96–106)
Creatinine, Ser: 0.84 mg/dL (ref 0.57–1.00)
GFR calc Af Amer: 86 mL/min/{1.73_m2} (ref >59)
GFR calc non Af Amer: 75 mL/min/{1.73_m2} (ref >59)
Globulin, Total: 2.6 g/dL (ref 1.5–4.5)
Glucose: 126 mg/dL — ABNORMAL HIGH (ref 65–99)
Potassium: 4.1 mmol/L (ref 3.5–5.2)
Sodium: 145 mmol/L — ABNORMAL HIGH (ref 134–144)
Total Protein: 7.1 g/dL (ref 6.0–8.5)

## 2018-08-18 LAB — TSH: TSH: 1.81 u[IU]/mL (ref 0.450–4.500)

## 2018-08-19 NOTE — Assessment & Plan Note (Addendum)
Patient noted to be tachycardic in the office today. She is not experiencing chest pain, palpitations, dizziness or changes in vision. Her BP is normal. Given her recent history of high and low BPs with EMS called yesterday for hypertension and dizziness, we will pursue a workup for tachycardia and dizziness. See separate problem for BP management. - EKG 12-Lead - NSR, HR 95, no afib - CBC - TSH

## 2018-08-19 NOTE — Assessment & Plan Note (Addendum)
Patient had an episode of hypertension yesterday evening associated with dizziness. However, she endorses checking her blood pressure and it was 90 systolic in the morning. She is normotensive in the office today. - referral to Dr. Graylin Shiver clinic for ambulatory 24h  BP monitoring - continue current BP management as she is currently normotensive - CMP - lipid panel - return precautions advised - she should bring her BP cuff to next OV so it can be tested with office cuff

## 2018-08-19 NOTE — Assessment & Plan Note (Signed)
New diagnosis. Previously prediabetes. Discussed meaning of the HbA1c as average blood sugar over 3 months. Discussed diet and lifestyle changes. Provided dietitian information incase she would like to make an appointment. Engaged in shared decision making and we will start low dose metformin and recheck HbA1c in 3 months. - metFORMIN (GLUCOPHAGE-XR) 500 MG 24 hr tablet; Take 1 tablet (500 mg total) by mouth daily with breakfast.  Dispense: 90 tablet; Refill: 0

## 2018-08-20 ENCOUNTER — Encounter: Payer: Self-pay | Admitting: Student in an Organized Health Care Education/Training Program

## 2018-08-20 NOTE — Progress Notes (Signed)
Called and attempted to give patient results but no answer. Left generic voicemail. Patient would benefit from high intensity statin. I will send her a letter with results and recommend that she follows up in clinic to discuss risks and benefits of starting a statin medication.

## 2018-08-24 ENCOUNTER — Encounter: Payer: Self-pay | Admitting: Gastroenterology

## 2018-08-24 ENCOUNTER — Telehealth: Payer: Self-pay

## 2018-08-24 ENCOUNTER — Ambulatory Visit: Payer: BLUE CROSS/BLUE SHIELD

## 2018-08-24 NOTE — Telephone Encounter (Signed)
Patient calls nurse line requesting results from recent visit. I read her the letter, as she has not received it yet. Patient stated she is already on a statin, doesn't know why you said "start her on one." Please call patient. I offered a virtual visit so you could discuss, however patient stated she doesn't want to pay again. Please advise.

## 2018-08-24 NOTE — Telephone Encounter (Signed)
LVM. Patient should continue her statin.

## 2018-08-30 NOTE — Telephone Encounter (Signed)
Pt wants to know if she can start taking vitamin D. Christen Bame, CMA

## 2018-09-06 NOTE — Telephone Encounter (Signed)
We did not discuss vitamin D at her visit and it was not checked. Would recommend incorporating vitamin D rich foods into her diet and we can discuss further at her next OV.

## 2018-09-11 ENCOUNTER — Telehealth: Payer: Self-pay | Admitting: *Deleted

## 2018-09-11 NOTE — Telephone Encounter (Signed)
Pt calls for 2 reasons:  1. She wants to know if she can increase her trazodone to 50mg  a night.  25mg  is not helping and the melatonin she was told to take makes her dizzy if she takes more than 5mg .   2. Her BP has been on the low side (she reports 87/69 abd 98/69).  States that she has changed her diet and wonders if that is causing it.  Advised that is possible.  Appt made for tomorrow afternoon to check BP.  Christen Bame, CMA

## 2018-09-12 ENCOUNTER — Ambulatory Visit: Payer: BLUE CROSS/BLUE SHIELD

## 2018-09-12 NOTE — Telephone Encounter (Signed)
I will speak with her at her visit today. I would hold off increasing trazodone if her BP is running low.

## 2018-09-17 ENCOUNTER — Other Ambulatory Visit: Payer: Self-pay

## 2018-09-17 ENCOUNTER — Ambulatory Visit: Payer: BLUE CROSS/BLUE SHIELD

## 2018-09-27 DIAGNOSIS — R000 Tachycardia, unspecified: Secondary | ICD-10-CM | POA: Diagnosis not present

## 2018-09-27 DIAGNOSIS — E7800 Pure hypercholesterolemia, unspecified: Secondary | ICD-10-CM | POA: Diagnosis not present

## 2018-09-27 DIAGNOSIS — E119 Type 2 diabetes mellitus without complications: Secondary | ICD-10-CM | POA: Diagnosis not present

## 2018-09-27 DIAGNOSIS — I10 Essential (primary) hypertension: Secondary | ICD-10-CM | POA: Diagnosis not present

## 2018-09-30 ENCOUNTER — Other Ambulatory Visit: Payer: Self-pay | Admitting: Student in an Organized Health Care Education/Training Program

## 2018-10-03 ENCOUNTER — Telehealth: Payer: Self-pay

## 2018-10-03 NOTE — Telephone Encounter (Signed)
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 Patient is requesting a letter stating she can only do online due to her underlying conditions and covid. Patient will call back with fax number. Please advise.

## 2018-10-04 ENCOUNTER — Encounter: Payer: Self-pay | Admitting: *Deleted

## 2018-10-04 NOTE — Telephone Encounter (Signed)
Spoke to patient over the phone about her current situation.  Patient reports that she finished chemotherapy for her breast cancer about 2 years ago.  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  She would like to be excused from these classes due to immunocompromise from breast cancer treatment.  Unfortunately, I am not the physician who provided patient with chemotherapy, and thus would not be able to write the letter for her, as I am not familiar with her treatment plans in detail.  Patient reports that she has called over to Dr. Hulan Fess office a few times.  I will route this message to Dr. Jana Hakim; it appears that he is out of the office until 7/27, however, his messages are being forwarded to APPs.  Please call this patient back to address this issue.  I appreciate your help.  Please call me if you have any questions.  Best,   Wilber Oliphant, M.D.

## 2018-10-05 NOTE — Telephone Encounter (Signed)
Thank you Dr. Maudie Mercury.  We have written the letter, and Dr. Virgie Dad nurse, Mateo Flow has been trying to call the patient several times without success.  We will continue to try to reach out to the patient.  We appreciate your outstanding care and communication.    Warm Regards,  Wilber Bihari, NP

## 2018-10-10 ENCOUNTER — Encounter: Payer: BLUE CROSS/BLUE SHIELD | Admitting: Gastroenterology

## 2018-10-16 DIAGNOSIS — I10 Essential (primary) hypertension: Secondary | ICD-10-CM | POA: Diagnosis not present

## 2018-10-17 DIAGNOSIS — I10 Essential (primary) hypertension: Secondary | ICD-10-CM | POA: Diagnosis not present

## 2018-10-22 DIAGNOSIS — Z9889 Other specified postprocedural states: Secondary | ICD-10-CM | POA: Diagnosis not present

## 2018-10-22 DIAGNOSIS — R928 Other abnormal and inconclusive findings on diagnostic imaging of breast: Secondary | ICD-10-CM | POA: Diagnosis not present

## 2018-11-10 ENCOUNTER — Other Ambulatory Visit: Payer: Self-pay | Admitting: Student in an Organized Health Care Education/Training Program

## 2018-11-10 DIAGNOSIS — E119 Type 2 diabetes mellitus without complications: Secondary | ICD-10-CM

## 2018-11-19 NOTE — Progress Notes (Signed)
Tanya [name]  [phone] [phone]     ID: Glenis Musolf DOB: 25-Feb-1956  MR#: [id number]  BLT#:[id number]  Patient Care Team: Wilber Oliphant, MD as PCP - General (Family Medicine) Rolm Bookbinder, MD as Consulting Physician (General Surgery) Magrinat, Virgie Dad, MD as Consulting Physician (Oncology) Kyung Rudd, MD as Consulting Physician (Radiation Oncology) OTHER MD:  CHIEF COMPLAINT: Estrogen receptor positive breast cancer  CURRENT TREATMENT: anastrozole   BREAST CANCER HISTORY: From the original intake note:  Tanya [name] had routine bilateral screening mammography at the [facility] 04/19/2016. This showed a possible area of the symmetry in the left breast and a prominent left axillary lymph nodes. On 04/28/2016 she underwent diagnostic left mammography with tomography and left breast ultrasonography. This showed the breast density to be category B. There was an obscured mass in the upper outer left breast and an enlarged left axillary lymph node. On exam there was focal thickening at the 2:00 position of the left breast 9 cm from the nipple. Targeted ultrasonography confirmed a 1.8 cm hypoechoic mass with a second adjacent 1.1 cm mass both at the 2:00 position of the left breast 9 cm from the nipple. The conglomerate area measured 3.5 cm. There was also an enlarged left axillary lymph node with loss of fatty hilum measuring 1.8 cm.  On family 16 2018 the patient underwent biopsy of the larger left breast mass in question and the suspicious left axillary lymph node. 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  The patient's subsequent history is as detailed below   INTERVAL HISTORY: Tanya [name] returns today for follow-up and  treatment of her estrogen receptor positive breast cancer. She was last seen here on 11/21/2017.   She continues on anastrozole.  Hot flashes are not a major problem.  She thinks it might make her a little tired sometimes  There is no bone density screening on file.   Since her last visit here, she had mammography at the [facility] 10/22/2018 showing breast density category B.  There was no evidence of malignancy.  REVIEW OF SYSTEMS: Tanya [name] is not exercising regularly.  She has very variable blood pressure she tells me.  She has discussed this with her primary care physician.  She had some problems with itching and some sharp pains in the belly but that has resolved.  She had some difficulty or discomfort around her bellybutton that was about 6 months ago and has not recurred.  She has significant insomnia.  She has tried melatonin and trazodone without success.  She has been sick scheduled for a sleep study but is reluctant to participate because of the corona virus.  A detailed review of systems today was otherwise stable  PAST MEDICAL HISTORY: Past Medical History:  Diagnosis Date  . ANXIETY   . Arthritis   . Dyspnea on exertion   . HYPERLIPIDEMIA   . HYPERTENSION, BENIGN ESSENTIAL   . Malignant neoplasm of upper-outer quadrant of left breast in female, estrogen receptor positive ([place]) 05/04/2016  . OBESITY   . Personal history of chemotherapy   . Personal history of radiation therapy   . Pre-diabetes    no medications    PAST SURGICAL HISTORY: Past Surgical History:  Procedure Laterality Date  . BREAST LUMPECTOMY Left 10/2016  . BREAST  LUMPECTOMY WITH RADIOACTIVE SEED AND SENTINEL LYMPH NODE BIOPSY Left 11/03/2016   Procedure: INJECT BLUE DYE LEFT BREAST, LEFT BREAST RADIOACTIVE SEED GUIDED LUMPECTOMY WITH LEFT SENTINEL LYMPH NODE BIOPSY AND RADIOACTIVE SEED TARGETED AXILLARY LYMPH NODE EXCISION;  Surgeon: Rolm Bookbinder, MD;  Location: [facility];  Service:  General;  Laterality: Left;  . CESAREAN SECTION    . PORT-A-CATH REMOVAL Right 11/03/2016   Procedure: REMOVAL PORT-A-CATH;  Surgeon: Rolm Bookbinder, MD;  Location: [facility];  Service: General;  Laterality: Right;  . PORTACATH PLACEMENT Right 06/02/2016   Procedure: INSERTION PORT-A-CATH WITH ULTRASOUND GUIDANCE;  Surgeon: Rolm Bookbinder, MD;  Location: [facility];  Service: General;  Laterality: Right;  . TUBAL LIGATION      FAMILY HISTORY Family History  Problem Relation Age of Onset  . Breast cancer Mother 79  The patient's father had a history of prostate cancer. He died at age 25. The patient's mother died at age 77 from unclear causes. She had a history of breast cancer diagnosed in her 36s. The patient had 4 brothers, 2 sisters. There is no other history of breast or ovarian cancer in the family to the patient's knowledge  GYNECOLOGIC HISTORY:  No LMP recorded. Patient is postmenopausal. Menarche age 52, first live birth age 62, the patient is [place] P4. She stopped having periods approximately 2006. She did not use hormone replacement. She used oral contraceptives briefly and remotely, with no complications.  SOCIAL HISTORY:  The patient is originally from [name]. 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 The patient's husband [name] was also a professor at [employer], where he taught agriculture oh research. Son Tanya [name] works in Land, son Tanya [name] works for [employer], daughter Tanya [name] is a Education officer, museum, and daughter Tanya [name] is also a Education officer, museum, all living in [place]. The patient has 3 grandchildren. She attends [employer]    ADVANCED DIRECTIVES: Not in place   HEALTH MAINTENANCE: Social History   Tobacco Use  . Smoking status: Never Smoker  . Smokeless tobacco: Never Used  Substance Use Topics  . Alcohol use: Yes    Comment: rarely  . Drug use: No     Colonoscopy:Never  PAP: November 2017  Bone density: Never   Allergies   Allergen Reactions  . Ascorbic Acid & Derivatives Other (See Comments)    Mouth sores (Vitamin C) with higher doses  . Camoquin [Amodiaquine] Itching    Current Outpatient Medications  Medication Sig Dispense Refill  . anastrozole (ARIMIDEX) 1 MG tablet TAKE 1 TABLET BY MOUTH EVERY DAY 90 tablet 4  . losartan (COZAAR) 50 MG tablet TAKE 1 TABLET (50 MG TOTAL) BY MOUTH DAILY. 90 tablet 3  . metFORMIN (GLUCOPHAGE-XR) 500 MG 24 hr tablet TAKE 1 TABLET BY MOUTH EVERY DAY WITH BREAKFAST 90 tablet 0  . Multiple Vitamin (MULTIVITAMIN WITH MINERALS) TABS tablet Take 1 tablet by mouth 3 (three) times a week. One-A-Day Women's 50+    . pravastatin (PRAVACHOL) 40 MG tablet TAKE 1 TABLET BY MOUTH AT BEDTIME 90 tablet 2  . traZODone (DESYREL) 50 MG tablet TAKE 0.5 TABLETS (25 MG TOTAL) BY MOUTH AT BEDTIME AS NEEDED FOR SLEEP. 45 tablet 1   No current facility-administered medications for this visit.     OBJECTIVE: Middle-aged [place] American woman in no acute distress  Vitals:   11/20/18 1350  BP: (!) 140/91  Pulse: 86  Resp: 18  Temp: 97.8 F (36.6 C)  SpO2: 100%   Wt Readings from  Last 3 Encounters:  11/20/18 175 lb 6.4 oz (79.6 kg)  05/22/18 191 lb 4 oz (86.8 kg)  03/20/18 195 lb (88.5 kg)   Body mass index is 31.07 kg/m.    ECOG FS:1 - Symptomatic but completely ambulatory  Ocular: Sclerae unicteric, pupils round and equal Ear-nose-throat: Wearing a mask Lymphatic: No cervical or supraclavicular adenopathy Lungs no rales or rhonchi Heart regular rate and rhythm Abd soft, nontender, positive bowel sounds MSK no focal spinal tenderness, no joint edema Neuro: non-focal, well-oriented, appropriate affect Breasts: The left breast is status post lumpectomy and radiation with no evidence of local recurrence.  The right breast is benign.  Both axillae are benign.  LAB RESULTS:  CMP     Component Value Date/Time   NA 141 11/20/2018 1336   NA 145 (H) 08/17/2018 1524   NA 141  01/18/2017 1425   K 4.3 11/20/2018 1336   K 3.8 01/18/2017 1425   CL 103 11/20/2018 1336   CO2 27 11/20/2018 1336   CO2 27 01/18/2017 1425   GLUCOSE 106 (H) 11/20/2018 1336   GLUCOSE 107 01/18/2017 1425   BUN 17 11/20/2018 1336   BUN 14 08/17/2018 1524   BUN 12.5 01/18/2017 1425   CREATININE 1.14 (H) 11/20/2018 1336   CREATININE 0.8 01/18/2017 1425   CALCIUM 9.5 11/20/2018 1336   CALCIUM 9.0 01/18/2017 1425   PROT 7.3 11/20/2018 1336   PROT 7.1 08/17/2018 1524   PROT 6.7 01/18/2017 1425   ALBUMIN 4.2 11/20/2018 1336   ALBUMIN 4.5 08/17/2018 1524   ALBUMIN 3.7 01/18/2017 1425   AST 16 11/20/2018 1336   AST 19 01/18/2017 1425   ALT 16 11/20/2018 1336   ALT 18 01/18/2017 1425   ALKPHOS 82 11/20/2018 1336   ALKPHOS 73 01/18/2017 1425   BILITOT 0.4 11/20/2018 1336   BILITOT 0.4 08/17/2018 1524   BILITOT 0.30 01/18/2017 1425   GFRNONAA 51 (L) 11/20/2018 1336   GFRAA 60 (L) 11/20/2018 1336    INo results found for: SPEP, UPEP  CBC    Component Value Date/Time   WBC 6.4 11/20/2018 1336   RBC 5.04 11/20/2018 1336   HGB 13.5 11/20/2018 1336   HGB 14.0 08/17/2018 1524   HGB 12.5 01/18/2017 1425   HCT 41.4 11/20/2018 1336   HCT 41.0 08/17/2018 1524   HCT 39.5 01/18/2017 1425   PLT 181 11/20/2018 1336   PLT 181 08/17/2018 1524   MCV 82.1 11/20/2018 1336   MCV 78 (L) 08/17/2018 1524   MCV 77.1 (L) 01/18/2017 1425   MCH 26.8 11/20/2018 1336   MCHC 32.6 11/20/2018 1336   RDW 15.0 11/20/2018 1336   RDW 14.9 08/17/2018 1524   RDW 18.1 (H) 01/18/2017 1425   LYMPHSABS 2.2 11/20/2018 1336   LYMPHSABS 0.9 01/18/2017 1425   MONOABS 0.4 11/20/2018 1336   MONOABS 0.5 01/18/2017 1425   EOSABS 0.0 11/20/2018 1336   EOSABS 0.1 01/18/2017 1425   BASOSABS 0.1 11/20/2018 1336   BASOSABS 0.0 01/18/2017 1425        Chemistry      Component Value Date/Time   NA 141 11/20/2018 1336   NA 145 (H) 08/17/2018 1524   NA 141 01/18/2017 1425   K 4.3 11/20/2018 1336   K 3.8  01/18/2017 1425   CL 103 11/20/2018 1336   CO2 27 11/20/2018 1336   CO2 27 01/18/2017 1425   BUN 17 11/20/2018 1336   BUN 14 08/17/2018 1524   BUN 12.5 01/18/2017 1425  CREATININE 1.14 (H) 11/20/2018 1336   CREATININE 0.8 01/18/2017 1425      Component Value Date/Time   CALCIUM 9.5 11/20/2018 1336   CALCIUM 9.0 01/18/2017 1425   ALKPHOS 82 11/20/2018 1336   ALKPHOS 73 01/18/2017 1425   AST 16 11/20/2018 1336   AST 19 01/18/2017 1425   ALT 16 11/20/2018 1336   ALT 18 01/18/2017 1425   BILITOT 0.4 11/20/2018 1336   BILITOT 0.4 08/17/2018 1524   BILITOT 0.30 01/18/2017 1425       No results found for: LABCA2  No components found for: LABCA125  No results for input(s): INR in the last 168 hours.  Urinalysis    Component Value Date/Time   COLORURINE yellow 06/01/2006 1116   APPEARANCEUR Clear 06/01/2006 1116   LABSPEC 1.010 08/18/2016 1217   PHURINE 6.5 08/18/2016 1217   PHURINE 6.5 06/01/2006 1116   GLUCOSEU Negative 08/18/2016 1217   HGBUR Negative 08/18/2016 1217   HGBUR negative 06/01/2006 1116   BILIRUBINUR Negative 08/18/2016 1217   KETONESUR Negative 08/18/2016 1217   PROTEINUR Negative 08/18/2016 1217   UROBILINOGEN 0.2 08/18/2016 1217   NITRITE Negative 08/18/2016 1217   NITRITE negative 06/01/2006 1116   LEUKOCYTESUR Negative 08/18/2016 1217     STUDIES: CLINICAL DATA: 63 year old female for annual mammogram follow-up. History of LEFT breast cancer and lumpectomy in 2018.  EXAM: DIGITAL DIAGNOSTIC BILATERAL MAMMOGRAM WITH CAD AND TOMO  COMPARISON: Previous exam(s).  ACR Breast Density Category b: There are scattered areas of fibroglandular density.  FINDINGS: 2D and 3D full field views of both breasts and a magnification view of the lumpectomy site demonstrate no suspicious mass, nonsurgical distortion or worrisome calcifications.  LEFT lumpectomy changes again noted.  Mammographic images were processed with CAD.  IMPRESSION: No  mammographic evidence of breast malignancy.  RECOMMENDATION: Bilateral diagnostic mammogram in 1 year  I have discussed the findings and recommendations with the patient. If applicable, a reminder letter will be sent to the patient regarding the next appointment.  BI-RADS CATEGORY 2: Benign.   Electronically Signed  By: Margarette Canada M.D.  On: 10/22/2018 15:31  ELIGIBLE FOR AVAILABLE RESEARCH PROTOCOL: Not a candidate for Prevent (already on a statin drug)  ASSESSMENT: 63 y.o. [place] woman status post left breast upper inner quadrant and left axillary lymph node biopsy every 16 2018, both showing invasive ductal carcinoma, grade 3, clinically T1c N1a, or stage IIB, estrogen receptor positive, progesterone receptor and HER-2 negative, with an MIB-1 of 40%  (a) MRI biopsy 05/25/2016 of a second area of concern in the left breast also shows invasive ductal carcinoma  (1) neoadjuvant anastrozole started 05/11/2016, held at the start of chemotherapy  (2) Mammaprint results shows the tumor to be luminal B, high risk, indicating a need for chemotherapy.  (3) chemotherapy consisting of doxorubicin and cyclophosphamide in dose dense fashion 4 starting 06/09/2016, completed 07/21/2016, followed by weekly paclitaxel started 08/11/2016  (a) paclitaxel stopped after cycle 8 because of neuropathy; last dose 09/29/2016  (4) left lumpectomy and sentinel lymph node sampling 11/03/2016 showed no residual tumor in the breast. One of 2 sentinel lymph nodes was involved by tumor. Margins were clear. pT0 pN1  (5) adjuvant radiation completed 02/01/2017 1. Left breast and supraclavicular 50.4 Gy in 28 fractions 2. Boost to the seroma 10 Gy in 5 fractions Total of 60.4 Gy  (6) to resume anastrozole 02/11/2017  PLAN:  Aurie is now just over 2 years out from definitive surgery for breast cancer with no evidence  of disease recurrence.  This is very favorable.  She is tolerating anastrozole  well and the plan is to continue that a minimum of 5 years.  I reassured her that some of the vague and intermittent symptoms she is having have really nothing to do with her breast cancer or its treatment.  I encouraged her to undergo the sleep study as suggested by her primary care physician.  She will return to see me in 1 year.  She knows to call for any problems that may develop before that visit.  Chidiebere Wynn, Virgie Dad, MD  11/20/18 2:25 PM Medical Oncology and Hematology [facility] [address] [place], [place] [address] Tel. [phone]    Fax. [phone]  I, Jacqualyn Posey am acting as a Education administrator for Chauncey Cruel, MD.   I, Lurline Del MD, have reviewed the above documentation for accuracy and completeness, and I agree with the above.

## 2018-11-20 ENCOUNTER — Inpatient Hospital Stay: Payer: BLUE CROSS/BLUE SHIELD | Attending: Oncology

## 2018-11-20 ENCOUNTER — Telehealth: Payer: Self-pay

## 2018-11-20 ENCOUNTER — Inpatient Hospital Stay: Payer: BLUE CROSS/BLUE SHIELD | Admitting: Oncology

## 2018-11-20 ENCOUNTER — Other Ambulatory Visit: Payer: Self-pay

## 2018-11-20 VITALS — BP 140/91 | HR 86 | Temp 97.8°F | Resp 18 | Wt 175.4 lb

## 2018-11-20 DIAGNOSIS — Z79811 Long term (current) use of aromatase inhibitors: Secondary | ICD-10-CM | POA: Diagnosis not present

## 2018-11-20 DIAGNOSIS — R232 Flushing: Secondary | ICD-10-CM | POA: Diagnosis not present

## 2018-11-20 DIAGNOSIS — I10 Essential (primary) hypertension: Secondary | ICD-10-CM | POA: Diagnosis not present

## 2018-11-20 DIAGNOSIS — E785 Hyperlipidemia, unspecified: Secondary | ICD-10-CM | POA: Insufficient documentation

## 2018-11-20 DIAGNOSIS — Z170 Estrogen receptor positive status [ER+]: Secondary | ICD-10-CM

## 2018-11-20 DIAGNOSIS — Z8042 Family history of malignant neoplasm of prostate: Secondary | ICD-10-CM | POA: Diagnosis not present

## 2018-11-20 DIAGNOSIS — C50412 Malignant neoplasm of upper-outer quadrant of left female breast: Secondary | ICD-10-CM

## 2018-11-20 DIAGNOSIS — Z803 Family history of malignant neoplasm of breast: Secondary | ICD-10-CM | POA: Diagnosis not present

## 2018-11-20 DIAGNOSIS — Z923 Personal history of irradiation: Secondary | ICD-10-CM | POA: Diagnosis not present

## 2018-11-20 DIAGNOSIS — Z79899 Other long term (current) drug therapy: Secondary | ICD-10-CM | POA: Diagnosis not present

## 2018-11-20 DIAGNOSIS — E119 Type 2 diabetes mellitus without complications: Secondary | ICD-10-CM | POA: Insufficient documentation

## 2018-11-20 LAB — CBC WITH DIFFERENTIAL/PLATELET
Abs Immature Granulocytes: 0.02 10*3/uL (ref 0.00–0.07)
Basophils Absolute: 0.1 10*3/uL (ref 0.0–0.1)
Basophils Relative: 1 %
Eosinophils Absolute: 0 10*3/uL (ref 0.0–0.5)
Eosinophils Relative: 1 %
HCT: 41.4 % (ref 36.0–46.0)
Hemoglobin: 13.5 g/dL (ref 12.0–15.0)
Immature Granulocytes: 0 %
Lymphocytes Relative: 34 %
Lymphs Abs: 2.2 10*3/uL (ref 0.7–4.0)
MCH: 26.8 pg (ref 26.0–34.0)
MCHC: 32.6 g/dL (ref 30.0–36.0)
MCV: 82.1 fL (ref 80.0–100.0)
Monocytes Absolute: 0.4 10*3/uL (ref 0.1–1.0)
Monocytes Relative: 6 %
Neutro Abs: 3.7 10*3/uL (ref 1.7–7.7)
Neutrophils Relative %: 58 %
Platelets: 181 10*3/uL (ref 150–400)
RBC: 5.04 MIL/uL (ref 3.87–5.11)
RDW: 15 % (ref 11.5–15.5)
WBC: 6.4 10*3/uL (ref 4.0–10.5)
nRBC: 0 % (ref 0.0–0.2)

## 2018-11-20 LAB — COMPREHENSIVE METABOLIC PANEL
ALT: 16 U/L (ref 0–44)
AST: 16 U/L (ref 15–41)
Albumin: 4.2 g/dL (ref 3.5–5.0)
Alkaline Phosphatase: 82 U/L (ref 38–126)
Anion gap: 11 (ref 5–15)
BUN: 17 mg/dL (ref 8–23)
CO2: 27 mmol/L (ref 22–32)
Calcium: 9.5 mg/dL (ref 8.9–10.3)
Chloride: 103 mmol/L (ref 98–111)
Creatinine, Ser: 1.14 mg/dL — ABNORMAL HIGH (ref 0.44–1.00)
GFR calc Af Amer: 60 mL/min — ABNORMAL LOW (ref >60)
GFR calc non Af Amer: 51 mL/min — ABNORMAL LOW (ref >60)
Glucose, Bld: 106 mg/dL — ABNORMAL HIGH (ref 70–99)
Potassium: 4.3 mmol/L (ref 3.5–5.1)
Sodium: 141 mmol/L (ref 135–145)
Total Bilirubin: 0.4 mg/dL (ref 0.3–1.2)
Total Protein: 7.3 g/dL (ref 6.5–8.1)

## 2018-11-20 MED ORDER — ANASTROZOLE 1 MG PO TABS: 1.0000 mg | ORAL_TABLET | Freq: Every day | ORAL | 4 refills | Status: DC

## 2018-11-20 NOTE — Telephone Encounter (Signed)
Spoke with patient to inform that her labs show slightly elevated kidney function.  Per NP, increase water intake daily to lower function.  Patient voiced understanding.  Verified patient PCP, Dr. Osborne Casco, and faxed labs to provider.

## 2018-11-20 NOTE — Telephone Encounter (Signed)
-----   Message from Gardenia Phlegm, NP sent at 11/20/2018  2:50 PM EDT ----- Kidney function slightly elevated.  REcommend increasing water intake and fax copy of labs to her PCP ----- Message ----- From: Buel Ream, Lab In [place] Sent: 11/20/2018   1:46 PM EDT To: Chauncey Cruel, MD

## 2018-11-21 ENCOUNTER — Telehealth: Payer: Self-pay | Admitting: Oncology

## 2018-11-21 NOTE — Telephone Encounter (Signed)
I left a message regarding schedule  

## 2018-12-12 ENCOUNTER — Telehealth: Payer: Self-pay | Admitting: *Deleted

## 2018-12-12 NOTE — Telephone Encounter (Signed)
Received VM from pt stating Dr. Jana Hakim recommended her to start taking Vitamin D but wanted to know if she should take vitamin D2 or vitamin D3 and if Vitamin D can cause her high cholesterol to go even higher.  Per Dr. Jana Hakim, pt to take Vitamin D3 1,000 units daily and there are no know side effects on cholesterol.  RN attempt x1 to return call to pt, no answer, LVM with instructions to take Vitamin D3 1,000 units daily and no known interaction with her cholesterol.

## 2018-12-21 ENCOUNTER — Other Ambulatory Visit: Payer: Self-pay | Admitting: *Deleted

## 2019-01-01 DIAGNOSIS — E669 Obesity, unspecified: Secondary | ICD-10-CM | POA: Diagnosis not present

## 2019-01-01 DIAGNOSIS — I10 Essential (primary) hypertension: Secondary | ICD-10-CM | POA: Diagnosis not present

## 2019-01-01 DIAGNOSIS — E119 Type 2 diabetes mellitus without complications: Secondary | ICD-10-CM | POA: Diagnosis not present

## 2019-01-01 DIAGNOSIS — Z23 Encounter for immunization: Secondary | ICD-10-CM | POA: Diagnosis not present

## 2019-01-01 DIAGNOSIS — G4700 Insomnia, unspecified: Secondary | ICD-10-CM | POA: Diagnosis not present

## 2019-01-01 DIAGNOSIS — Z1331 Encounter for screening for depression: Secondary | ICD-10-CM | POA: Diagnosis not present

## 2019-01-20 ENCOUNTER — Other Ambulatory Visit: Payer: Self-pay | Admitting: Student in an Organized Health Care Education/Training Program

## 2019-08-05 ENCOUNTER — Other Ambulatory Visit: Payer: Self-pay | Admitting: Student in an Organized Health Care Education/Training Program

## 2019-08-05 ENCOUNTER — Other Ambulatory Visit: Payer: Self-pay

## 2019-08-05 ENCOUNTER — Encounter: Payer: Self-pay | Admitting: Neurology

## 2019-08-05 ENCOUNTER — Ambulatory Visit (INDEPENDENT_AMBULATORY_CARE_PROVIDER_SITE_OTHER): Payer: 59 | Admitting: Neurology

## 2019-08-05 VITALS — BP 147/97 | HR 104 | Ht 64.0 in | Wt 170.3 lb

## 2019-08-05 DIAGNOSIS — R4582 Worries: Secondary | ICD-10-CM | POA: Diagnosis not present

## 2019-08-05 DIAGNOSIS — R351 Nocturia: Secondary | ICD-10-CM

## 2019-08-05 DIAGNOSIS — E663 Overweight: Secondary | ICD-10-CM

## 2019-08-05 DIAGNOSIS — G4700 Insomnia, unspecified: Secondary | ICD-10-CM

## 2019-08-05 DIAGNOSIS — R0683 Snoring: Secondary | ICD-10-CM | POA: Diagnosis not present

## 2019-08-05 DIAGNOSIS — G479 Sleep disorder, unspecified: Secondary | ICD-10-CM

## 2019-08-05 NOTE — Progress Notes (Signed)
Subjective:    Patient ID: Tanya [name] is a 64 y.o. female.  HPI     Star Age, MD, PhD [facility] [address]O. [place], [place] [address]  Dear Dr. Osborne Casco,  I saw your patient, Tanya [name], upon your kind request in my sleep clinic today for initial consultation of her sleep disorder, in particular, difficulty initiating and maintaining sleep.  The patient is unaccompanied today.  As you know, [name] is a 64 year old right-handed woman with an underlying medical history of prediabetes, breast cancer, status post lumpectomy, chemotherapy and radiation, hypertension, hyperlipidemia, arthritis, anxiety and overweight state, who reports longstanding issues with her sleep, in particular going to sleep and also staying asleep for years, probably over 7 or 8 years, got worse when she was diagnosed with breast cancer in 2018.  Since then, she has had ongoing difficulty going to sleep, is a light sleeper, wakes up multiple times at night and sometimes only gets 2 to 4 hours of sleep on any given night.  She has recently been given a prescription for trazodone 50 mg strength half a pill to 1 pill at night as needed but reports that it did not help.  She was also given a prescription for Cymbalta which she has not started yet.  She tried gabapentin in the past for neuropathy, but had side effects including confusion and sleepiness. She denies recurrent morning headaches but has nocturia about 2-3 times per average night.  She is a non-smoker and drinks alcohol rarely, no daily caffeine, drinks some decaf coffee in the mornings.  She lives with her husband.  She works as a Pharmacist, hospital.  She sometimes tries to sleep during the day but cannot fall asleep during the day.  She does admit to worrying about her health and her have racing thoughts at night.  She snores some according to her husband.  Her son has sleep apnea and uses a CPAP machine. I reviewed  your office note from 07/04/2019.  She has seen Dr. Posey Pronto for paresthesias last year, at which time she had no testing.  She reports intermittent tingling sensation and pins-and-needles sensation in different parts of her body, comes and goes.  Her Past Medical History Is Significant For: Past Medical History:  Diagnosis Date  . ANXIETY   . Arthritis   . Dyspnea on exertion   . HYPERLIPIDEMIA   . HYPERTENSION, BENIGN ESSENTIAL   . Malignant neoplasm of upper-outer quadrant of left breast in female, estrogen receptor positive ([place]) 05/04/2016  . OBESITY   . Personal history of chemotherapy   . Personal history of radiation therapy   . Pre-diabetes    no medications    Her Past Surgical History Is Significant For: Past Surgical History:  Procedure Laterality Date  . BREAST LUMPECTOMY Left 10/2016  . BREAST LUMPECTOMY WITH RADIOACTIVE SEED AND SENTINEL LYMPH NODE BIOPSY Left 11/03/2016   Procedure: INJECT BLUE DYE LEFT BREAST, LEFT BREAST RADIOACTIVE SEED GUIDED LUMPECTOMY WITH LEFT SENTINEL LYMPH NODE BIOPSY AND RADIOACTIVE SEED TARGETED AXILLARY LYMPH NODE EXCISION;  Surgeon: Rolm Bookbinder, MD;  Location: [facility];  Service: General;  Laterality: Left;  . CESAREAN SECTION    . PORT-A-CATH REMOVAL Right 11/03/2016   Procedure: REMOVAL PORT-A-CATH;  Surgeon: Rolm Bookbinder, MD;  Location: [place];  Service: General;  Laterality: Right;  . PORTACATH PLACEMENT Right 06/02/2016   Procedure: INSERTION PORT-A-CATH WITH ULTRASOUND GUIDANCE;  Surgeon: Rolm Bookbinder, MD;  Location: [facility] [place];  Service: General;  Laterality: Right;  . TUBAL LIGATION      Her Family History Is Significant For: Family History  Problem Relation Age of Onset  . Breast cancer Mother 22    Her Social History Is Significant For: Social History   Socioeconomic History  . Marital status: Married    Spouse name: Not on file  . Number of children: Not on file  . Years of education: 34  .  Highest education level: Master's degree (e.g., MA, MS, MEng, MEd, MSW, MBA)  Occupational History  . Occupation: works at [employer] Use  . Smoking status: Never Smoker  . Smokeless tobacco: Never Used  Substance and Sexual Activity  . Alcohol use: Yes    Comment: rarely  . Drug use: No  . Sexual activity: Not on file  Other Topics Concern  . Not on file  Social History Narrative   Lives with husband in a split level home.  Works at [employer].  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    Education: Masters.     Social Determinants of Health   Financial Resource Strain:   . Difficulty of Paying Living Expenses:   Food Insecurity:   . Worried About Charity fundraiser in the Last Year:   . Arboriculturist in the Last Year:   Transportation Needs:   . Film/video editor (Medical):   Marland Kitchen Lack of Transportation (Non-Medical):   Physical Activity:   . Days of Exercise per Week:   . Minutes of Exercise per Session:   Stress:   . Feeling of Stress :   Social Connections:   . Frequency of Communication with Friends and Family:   . Frequency of Social Gatherings with Friends and Family:   . Attends Religious Services:   . Active Member of Clubs or Organizations:   . Attends Archivist Meetings:   Marland Kitchen Marital Status:     Her Allergies Are:  Allergies  Allergen Reactions  . Ascorbic Acid & Derivatives Other (See Comments)    Mouth sores (Vitamin C) with higher doses  . Camoquin [Amodiaquine] Itching  :   Her Current Medications Are:  Outpatient Encounter Medications as of 08/05/2019  Medication Sig  . anastrozole (ARIMIDEX) 1 MG tablet Take 1 tablet (1 mg total) by mouth daily.  Marland Kitchen losartan (COZAAR) 50 MG tablet Take 1 tablet by mouth once daily  . MELATONIN PO Take by mouth.  . Multiple Vitamin (MULTIVITAMIN WITH MINERALS) TABS tablet Take 1 tablet by mouth 3 (three) times a week. One-A-Day Women's 50+  . Polyethylene Glycol 3350 (MIRALAX PO) Take by mouth.  . pravastatin (PRAVACHOL) 40  MG tablet TAKE 1 TABLET BY MOUTH EVERYDAY AT BEDTIME  . traZODone (DESYREL) 50 MG tablet TAKE 0.5 TABLETS (25 MG TOTAL) BY MOUTH AT BEDTIME AS NEEDED FOR SLEEP.  Marland Kitchen VITAMIN D PO Take by mouth. 1000 IU daily  . [DISCONTINUED] metFORMIN (GLUCOPHAGE-XR) 500 MG 24 hr tablet TAKE 1 TABLET BY MOUTH EVERY DAY WITH BREAKFAST   No facility-administered encounter medications on file as of 08/05/2019.  :  Review of Systems:  Out of a complete 14 point review of systems, all are reviewed and negative with the exception of these symptoms as listed below: Review of Systems  Neurological:       Here for a sleep consult. No prior sleep study. She report she does snore occasionally.  Epworth Sleepiness Scale 0= would never doze 1= slight chance of  dozing 2= moderate chance of dozing 3= high chance of dozing  Sitting and reading:1 Watching TV:1 Sitting inactive in a public place (ex. Theater or meeting):1 As a passenger in a car for an hour without a break:2 Lying down to rest in the afternoon:1 Sitting and talking to someone:1 Sitting quietly after lunch (no alcohol):1 In a car, while stopped in traffic:0 Total:8      Objective:  Neurological Exam  Physical Exam Physical Examination:   Vitals:   08/05/19 1105  BP: (!) 147/97  Pulse: (!) 104    General Examination: The patient is a very pleasant 64 y.o. female in no acute distress. She appears well-developed and well-nourished and well groomed.   HEENT: Normocephalic, atraumatic, pupils are equal, round and reactive to light, extraocular tracking is good without limitation to gaze excursion or nystagmus noted. Hearing is grossly intact. Face is symmetric with normal facial animation. Speech is clear with no dysarthria noted. There is no hypophonia. There is no lip, neck/head, jaw or voice tremor. Neck is supple with full range of passive and active motion. There are no carotid bruits on auscultation. Oropharynx exam reveals: mild mouth  dryness, adequate dental hygiene and mild airway crowding, due to tonsillar size of 1-2+, mildly redundant soft palate, Mallampati class II, neck circumference of 15-1/8 inches.  She has a mild overbite.  Tongue protrudes centrally and palate elevates symmetrically.  Chest: Clear to auscultation without wheezing, rhonchi or crackles noted.  Heart: S1+S2+0, regular and normal without murmurs, rubs or gallops noted.   Abdomen: Soft, non-tender and non-distended with normal bowel sounds appreciated on auscultation.  Extremities: There is no pitting edema in the distal lower extremities bilaterally.   Skin: Warm and dry without trophic changes noted.   Musculoskeletal: exam reveals no obvious joint deformities, tenderness or joint swelling or erythema.   Neurologically:  Mental status: The patient is awake, alert and oriented in all 4 spheres. Her immediate and remote memory, attention, language skills and fund of knowledge are appropriate. There is no evidence of aphasia, agnosia, apraxia or anomia. Speech is clear with normal prosody and enunciation. Thought process is linear. Mood is normal and affect is normal.  Cranial nerves II - XII are as described above under HEENT exam.  Motor exam: Normal bulk, strength and tone is noted. There is no tremor, Romberg is negative. Reflexes are 2+ throughout. Fine motor skills and coordination: grossly intact.  Cerebellar testing: No dysmetria or intention tremor. There is no truncal or gait ataxia.  Sensory exam: intact to light touch in the upper and lower extremities.  Gait, station and balance: She stands easily. No veering to one side is noted. No leaning to one side is noted. Posture is age-appropriate and stance is narrow based. Gait shows normal stride length and normal pace. No problems turning are noted.   Assessment and plan:   In summary, Gertrudis Roe is a very pleasant 64 y.o.-year old female with an underlying medical history of  prediabetes, breast cancer, status post lumpectomy, chemotherapy and radiation, hypertension, hyperlipidemia, arthritis, anxiety and overweight state, whose history and physical exam are concerning for obstructive sleep apnea (OSA). I had a long chat with the patient about my findings and the diagnosis of OSA, its prognosis and treatment options. We talked about medical treatments, surgical interventions and non-pharmacological approaches. I explained in particular the risks and ramifications of untreated moderate to severe OSA, especially with respect to developing cardiovascular disease down the Road, including congestive heart failure,  difficult to treat hypertension, cardiac arrhythmias, or stroke. Even type 2 diabetes has, in part, been linked to untreated OSA. Symptoms of untreated OSA include daytime sleepiness, memory problems, mood irritability and mood disorder such as depression and anxiety, lack of energy, as well as recurrent headaches, especially morning headaches. We talked about trying to maintain a healthy lifestyle in general, as well as the importance of weight control. We also talked about the importance of good sleep hygiene. I recommended the following at this time: sleep study.  She is furthermore encouraged to try the Cymbalta which was prescribed and low-dose by you.  She may benefit from counseling as she has had ongoing issues with anxiety and worry.  She is encouraged to talk to about this as well. I explained the sleep test procedure to the patient and also outlined possible surgical and non-surgical treatment options of OSA. She indicated that she would be willing to try CPAP if the need arises. I explained the importance of being compliant with PAP treatment, not only for insurance purposes but primarily to improve Her symptoms, and for the patient's long term health benefit, including to reduce Her cardiovascular risks. I answered all her questions today and the patient was in  agreement. I plan to see her back after the sleep study is completed and encouraged her to call with any interim questions, concerns, problems or updates.   Thank you very much for allowing me to participate in the care of this nice patient. If I can be of any further assistance to you please do not hesitate to call me at [phone].  Sincerely,   Star Age, MD, PhD

## 2019-08-05 NOTE — Patient Instructions (Addendum)
Thank you for choosing [facility] for your sleep related care! It was nice to meet you today! I appreciate that you entrust me with your sleep related healthcare concerns. I hope, I was able to address at least some of your concerns today, and that I can help you feel reassured and also get better.    Here is what we discussed today and what we came up with as our plan for you:    You can try Melatonin at night for sleep: take 1 mg to 3 mg, one to 2 hours before your bedtime. You can go up to 5 mg if needed. It is over the counter and comes in pill form, chewable form and spray, if you prefer.    For your stress and worry: please talk to Dr. Osborne Casco about seeing a counselor. He has also prescribed Cymbalta in low dose.   Based on your symptoms and your exam I believe you are at risk for obstructive sleep apnea (aka OSA), and I think we should proceed with a sleep study to determine whether you do or do not have OSA and how severe it is. Even, if you have mild OSA, I may want you to consider treatment with CPAP, as treatment of even borderline or mild sleep apnea can result and improvement of symptoms such as sleep disruption, daytime sleepiness, nighttime bathroom breaks, restless leg symptoms, improvement of headache syndromes, even improved mood disorder.   Please remember, the long-term risks and ramifications of untreated moderate to severe obstructive sleep apnea are: increased Cardiovascular disease, including congestive heart failure, stroke, difficult to control hypertension, treatment resistant obesity, arrhythmias, especially irregular heartbeat commonly known as A. Fib. (atrial fibrillation); even type 2 diabetes has been linked to untreated OSA.   Sleep apnea can cause disruption of sleep and sleep deprivation in most cases, which, in turn, can cause recurrent headaches, problems with memory, mood, concentration, focus, and vigilance. Most people with untreated sleep  apnea report excessive daytime sleepiness, which can affect their ability to drive. Please do not drive if you feel sleepy. Patients with sleep apnea developed difficulty initiating and maintaining sleep (aka insomnia).   Having sleep apnea may increase your risk for other sleep disorders, including involuntary behaviors sleep such as sleep terrors, sleep talking, sleepwalking.    Having sleep apnea can also increase your risk for restless leg syndrome and leg movements at night.   Please note that untreated obstructive sleep apnea may carry additional perioperative morbidity. Patients with significant obstructive sleep apnea (typically, in the moderate to severe degree) should receive, if possible, perioperative PAP (positive airway pressure) therapy and the surgeons and particularly the anesthesiologists should be informed of the diagnosis and the severity of the sleep disordered breathing.   I will likely see you back after your sleep study to go over the test results and where to go from there. We will call you after your sleep study to advise about the results (most likely, you will hear from [place], my nurse) and to set up an appointment at the time, as necessary.    Our sleep lab administrative assistant will call you to schedule your sleep study and give you further instructions, regarding the check in process for the sleep study, arrival time, what to bring, when you can expect to leave after the study, etc., and to answer any other logistical questions you may have. If you don't hear back from her by about 2 weeks from now, please feel free  to call her direct line at [phone] or you can call our general clinic number, or email [place] through My Chart.

## 2019-08-05 NOTE — Telephone Encounter (Signed)
Patient needs appointment for future refills.

## 2019-09-23 ENCOUNTER — Ambulatory Visit (INDEPENDENT_AMBULATORY_CARE_PROVIDER_SITE_OTHER): Payer: 59 | Admitting: Neurology

## 2019-09-23 DIAGNOSIS — G4700 Insomnia, unspecified: Secondary | ICD-10-CM

## 2019-09-23 DIAGNOSIS — G4733 Obstructive sleep apnea (adult) (pediatric): Secondary | ICD-10-CM | POA: Diagnosis not present

## 2019-09-23 DIAGNOSIS — E663 Overweight: Secondary | ICD-10-CM

## 2019-09-23 DIAGNOSIS — R351 Nocturia: Secondary | ICD-10-CM

## 2019-09-23 DIAGNOSIS — R0683 Snoring: Secondary | ICD-10-CM

## 2019-09-23 DIAGNOSIS — R4582 Worries: Secondary | ICD-10-CM

## 2019-10-01 NOTE — Procedures (Signed)
Patient Information     First Name: Tanya Last Name: [name] ID: [id number]  Birth Date: Jul 15, 2055 Age: 64 Gender: Female  Referring Provider: Dr. Osborne Casco BMI: 29.0 (W=170 lb, H=5' 4'')  Neck Circ.:  15 '' Epworth:  8/24   Sleep Study Information    Study Date: 09/23/19 S/H/A Version: 001.001.001.001 / 4.1.1528 / 66  History:    64 year old woman with a history of prediabetes, breast cancer, status post lumpectomy, chemotherapy and radiation, hypertension, hyperlipidemia, arthritis, anxiety and overweight state, who reports longstanding issues with her sleep, in particular going to sleep and also staying asleep for years, probably over 7 or 8 years, got worse when she was diagnosed with breast cancer in 2018. She snores some according to her husband.  Her son has sleep apnea and uses a CPAP machine. Summary & Diagnosis:     OSA Recommendations:     This HST shows obstructive sleep apnea by number of events, but no significant desaturations: total AHI was 27.8/hour and O2 nadir of 90%. Treatment with positive airway pressure can be considered with autoPAP. Treatment options otherwise include weight loss and avoidance of the supine sleep position or a dental device. The patient will be offered a follow up appointment in the sleep clinic. Please note, that other causes of the patient's symptoms, including circadian rhythm disturbances, an underlying mood disorder, medication effect and/or an underlying medical problem cannot be ruled out based on this test. Clinical correlation is recommended. The patient should be cautioned not to drive, work at heights, or operate dangerous or heavy equipment when tired or sleepy. Review and reiteration of good sleep hygiene measures should be pursued with any patient. The referring provider will be notified of the test results.   I certify that I have reviewed the raw data recording prior to the issuance of this report in accordance with the standards of the American  Academy of Sleep Medicine (AASM).  Star Age, MD, PhD  [facility]) [place], American Board of Psychiatry and Neurology Diplomat, [place] Board of Sleep Medicine             Sleep Summary  Oxygen Saturation Statistics   Start Study Time: End Study Time: Total Recording Time:          11:50:39 PM 8:26:07 AM   8 h, 35 min  Total Sleep Time % REM of Sleep Time:  7 h, 39 min  31.6    Mean: 94 Minimum: 90 Maximum: 98  Mean of Desaturations Nadirs (%):   93  Oxygen Desaturation. %: 4-9 10-20 >20 Total  Events Number Total  25 100.0  0 0.0  0 0.0  25 100.0  Oxygen Saturation: <90 <=88 <85 <80 <70  Duration (minutes): Sleep % 0.0 0.0 0.0 0.0 0.0 0.0 0.0 0.0 0.0 0.0     Respiratory Indices      Total Events REM NREM All Night  pRDI:  129  pAHI:  124 ODI:  25  pAHIc:  1  % CSR: 0.0 34.7 29.8 12.4 0.0 27.7 27.4 4.1 0.3 28.9 27.8 5.6 0.2       Pulse Rate Statistics during Sleep (BPM)      Mean:  69 Minimum: N/A Maximum: 94    Indices are calculated using technically valid sleep time of 4 h, 27 min. pRDI/pAHI are calculated using oxi desaturations ? 3%  Body Position Statistics  Position Supine Prone Right Left Non-Supine  Sleep (min) 288.0 1.0 53.5 116.0 170.5  Sleep %  62.7 0.2 11.6 25.2 37.1  pRDI 34.6 N/A 18.2 15.8 16.3  pAHI 33.6 N/A 14.6 14.9 14.8  ODI 7.8 N/A 0.0 0.9 0.7     Snoring Statistics Snoring Level (dB) >40 >50 >60 >70 >80 >Threshold (45)  Sleep (min) 79.7 9.8 2.3 0.0 0.0 24.4  Sleep % 17.3 2.1 0.5 0.0 0.0 5.3    Mean: 41 dB Sleep Stages Chart                                          pAHI=27.8                                                      Mild              Moderate                    Severe                                                 5              15                    30

## 2019-10-03 NOTE — Addendum Note (Signed)
Addended by: Star Age on: 10/03/2019 07:24 AM   Modules accepted: Orders

## 2019-10-03 NOTE — Progress Notes (Signed)
Patient referred by Dr. Osborne Casco for difficulty with her sleep, seen by me on 08/05/2019, home sleep test on 09/23/2019.    Please call and notify the patient that the recent home sleep test showed obstructive sleep apnea. OSA is in the moderate range by a number of respiratory events but she did not have significant desaturations, lowest oxygen saturation was 90%.  Nevertheless, I believe she may benefit from treatment for her sleep apnea given her difficulty with sleep maintenance.  I suggest that she start a trial of AutoPap therapy at home. This means, that we don't have to bring her in for a sleep study with CPAP.  She will get a AutoPap machine through a DME company (of her choice, or as per insurance requirement). The DME representative will educate her on how to use the machine, how to put the mask on, etc. I have placed an order in the chart. Please send referral, talk to patient, send report to referring MD. We will need a FU in sleep clinic for 10 weeks post-PAP set up, please arrange that with me or one of our NPs. Thanks,   Star Age, MD, PhD [facility])

## 2019-10-07 ENCOUNTER — Telehealth: Payer: Self-pay

## 2019-10-07 NOTE — Telephone Encounter (Signed)
I called pt. I advised pt that Dr. Rexene Alberts reviewed their sleep study results and found that pt had moderate osa but not significant 02 desaturations. Dr. Rexene Alberts recommends that pt start autopap therapy. I reviewed PAP compliance expectations with the pt. Pt is agreeable to starting an auto-PAP. I advised pt that an order will be sent to a DME, [facility], and [facility] will call the pt within about one week after they file with the pt's insurance. [facility] will show the pt how to use the machine, fit for masks, and troubleshoot the auto-PAP if needed.  Pt wanted to wait to hear from [facility] before proceeding with scheduling f/u and next steps.

## 2019-10-07 NOTE — Telephone Encounter (Signed)
Pt called back and asked for a copy of her ss to be mailed to her. I have printed and placed in the mail.

## 2019-10-07 NOTE — Telephone Encounter (Signed)
-----   Message from Star Age, MD sent at 10/03/2019  7:24 AM EDT ----- Patient referred by Dr. Osborne Casco for difficulty with her sleep, seen by me on 08/05/2019, home sleep test on 09/23/2019.    Please call and notify the patient that the recent home sleep test showed obstructive sleep apnea. OSA is in the moderate range by a number of respiratory events but she did not have significant desaturations, lowest oxygen saturation was 90%.  Nevertheless, I believe she may benefit from treatment for her sleep apnea given her difficulty with sleep maintenance.  I suggest that she start a trial of AutoPap therapy at home. This means, that we don't have to bring her in for a sleep study with CPAP.  She will get a AutoPap machine through a DME company (of her choice, or as per insurance requirement). The DME representative will educate her on how to use the machine, how to put the mask on, etc. I have placed an order in the chart. Please send referral, talk to patient, send report to referring MD. We will need a FU in sleep clinic for 10 weeks post-PAP set up, please arrange that with me or one of our NPs. Thanks,   Star Age, MD, PhD [facility])

## 2019-10-10 ENCOUNTER — Other Ambulatory Visit: Payer: Self-pay | Admitting: Oncology

## 2019-10-10 DIAGNOSIS — Z9889 Other specified postprocedural states: Secondary | ICD-10-CM

## 2019-10-28 NOTE — Telephone Encounter (Signed)
LVM asking for a call back to verify it pt had started her autopap therapy.

## 2019-10-28 NOTE — Telephone Encounter (Signed)
I called the pt and left a vm. Trying to verify if pt has started autopap therapy yet.

## 2019-10-28 NOTE — Telephone Encounter (Signed)
Pt has called Megan,RN back. Please call. 

## 2019-10-29 NOTE — Telephone Encounter (Signed)
I called the pt and she sts she is scheduled for 11/19/2019 to start her autopap.   Pt requests we call back after 11/19/2019 to schedule her f/u appt.  Reminder placed in the system to call.

## 2019-10-30 ENCOUNTER — Telehealth: Payer: Self-pay | Admitting: Adult Health

## 2019-10-30 NOTE — Telephone Encounter (Signed)
Rescheduled 9/8 appt per GM on PAL. Left voicemail with cancellation and new appt details.

## 2019-11-05 ENCOUNTER — Other Ambulatory Visit: Payer: Self-pay

## 2019-11-05 ENCOUNTER — Ambulatory Visit
Admission: RE | Admit: 2019-11-05 | Discharge: 2019-11-05 | Disposition: A | Discharge status: Home / Self Care | Payer: BLUE CROSS/BLUE SHIELD | Source: Ambulatory Visit | Attending: Oncology | Admitting: Oncology

## 2019-11-05 DIAGNOSIS — Z9889 Other specified postprocedural states: Secondary | ICD-10-CM

## 2019-11-20 ENCOUNTER — Other Ambulatory Visit: Payer: BLUE CROSS/BLUE SHIELD

## 2019-11-20 ENCOUNTER — Ambulatory Visit: Payer: BLUE CROSS/BLUE SHIELD | Admitting: Oncology

## 2019-11-28 ENCOUNTER — Ambulatory Visit: Payer: Self-pay | Admitting: Adult Health

## 2019-11-28 ENCOUNTER — Other Ambulatory Visit: Payer: Self-pay

## 2019-12-08 ENCOUNTER — Other Ambulatory Visit: Payer: Self-pay | Admitting: Oncology

## 2019-12-10 ENCOUNTER — Telehealth: Payer: Self-pay | Admitting: Oncology

## 2019-12-10 NOTE — Telephone Encounter (Signed)
Rescheduled 9/30 appt per 9/27 staff msg. Pt confirmed cancellation and new appt date and time.

## 2019-12-11 ENCOUNTER — Other Ambulatory Visit: Payer: Self-pay | Admitting: Family Medicine

## 2019-12-12 ENCOUNTER — Inpatient Hospital Stay: Payer: 59

## 2019-12-12 ENCOUNTER — Inpatient Hospital Stay: Payer: 59 | Admitting: Oncology

## 2019-12-18 ENCOUNTER — Other Ambulatory Visit: Payer: Self-pay | Admitting: Family Medicine

## 2020-01-27 ENCOUNTER — Telehealth: Payer: Self-pay

## 2020-01-27 NOTE — Telephone Encounter (Signed)
Pt called stating she would like to receive COVID booster while she is here tomorrow for appt with Dr Jana Hakim. Message sent to scheduling for them to place pt on COVID vaccine schedule. Pt understands to bring vaccine card with her.

## 2020-01-27 NOTE — Progress Notes (Signed)
Tanya [name]  [phone] [phone]     ID: Tanya [name] DOB: May 24, 1955  MR#: [id number]  HGD#:[id number]  Patient Care Team: Tanya Pao, MD as PCP - General (Internal Medicine) Tanya Bookbinder, MD as Consulting Physician (General Surgery) Tanya [name], Tanya Dad, MD as Consulting Physician (Oncology) Tanya Rudd, MD as Consulting Physician (Radiation Oncology) OTHER MD:  CHIEF COMPLAINT: Estrogen receptor positive breast cancer  CURRENT TREATMENT: anastrozole   INTERVAL HISTORY: Tanya [name] returns today for follow-up of her estrogen receptor positive breast cancer.   She continues on anastrozole. She is tolerating it with no significant side effects that she is aware of  There is no bone density screening on file.   Since her last visit, she underwent bilateral diagnostic mammography with tomography at [facility] on 11/05/2019 showing: breast density category C; no evidence of malignancy in either breast.    REVIEW OF SYSTEMS: Tanya [name] tells me she generally is doing good.  She sleeps with her mouth open so she has a dry mouth frequently.  She uses Biotene for this.  She has lost about 20 pounds thinks to dieting and walking.  Sometimes she feels a little bit off balance but she hasn't had any falls.  She feels tired sometimes and sometimes her muscles are little jerky but this is very rare.  She wants to know whether she has a problem with B12 and she wants to know what her blood type is.  She doesn't sleep well.  She does not know how to use her CPAP machine.  She feels exceedingly anxious.  For example she is unable to drive because of anxiety.  Somebody told her she should try Escitalopram and she would like to give that a try  COVID 19 VACCINATION STATUS: Status post [place] x2, with the booster shot scheduled for today 01/28/2020   BREAST CANCER HISTORY: From the original intake note:  Tanya [name] had routine bilateral  screening mammography at [facility] 04[facility]19[facility]2016. This showed a possible area of the symmetry in the left breast and a prominent left axillary lymph nodes. On 04/28/2016 she underwent diagnostic left mammography with tomography and left breast ultrasonography. This showed the breast density to be category B. There was an obscured mass in the upper outer left breast and an enlarged left axillary lymph node. On exam there was focal thickening at the 2:00 position of the left breast 9 cm from the nipple. Targeted ultrasonography confirmed a 1.8 cm hypoechoic mass with a second adjacent 1.1 cm mass both at the 2:00 position of the left breast 9 cm from the nipple. The conglomerate area measured 3.5 cm. There was also an enlarged left axillary lymph node with loss of fatty hilum measuring 1.8 cm.  On family 16 2018 the patient underwent biopsy of the larger left breast mass in question and the suspicious left axillary lymph node. 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  The patient's subsequent history is as detailed below   PAST MEDICAL HISTORY: Past Medical History:  Diagnosis Date  . ANXIETY   . Arthritis   . Dyspnea on exertion   . HYPERLIPIDEMIA   . HYPERTENSION, BENIGN ESSENTIAL   . Malignant neoplasm of upper-outer quadrant of left breast in female, estrogen receptor positive ([place]) 05/04/2016  . OBESITY   .  Personal history of chemotherapy   . Personal history of radiation therapy   . Pre-diabetes    no medications    PAST SURGICAL HISTORY: Past Surgical History:  Procedure Laterality Date  . BREAST BIOPSY Left 04/29/2016  . BREAST BIOPSY Left 05/25/2016  . BREAST LUMPECTOMY Left 10/2016  . BREAST LUMPECTOMY WITH RADIOACTIVE SEED AND SENTINEL LYMPH NODE BIOPSY  Left 11/03/2016   Procedure: INJECT BLUE DYE LEFT BREAST, LEFT BREAST RADIOACTIVE SEED GUIDED LUMPECTOMY WITH LEFT SENTINEL LYMPH NODE BIOPSY AND RADIOACTIVE SEED TARGETED AXILLARY LYMPH NODE EXCISION;  Surgeon: Tanya Bookbinder, MD;  Location: [facility];  Service: General;  Laterality: Left;  . CESAREAN SECTION    . PORT-A-CATH REMOVAL Right 11/03/2016   Procedure: REMOVAL PORT-A-CATH;  Surgeon: Tanya Bookbinder, MD;  Location: [place];  Service: General;  Laterality: Right;  . PORTACATH PLACEMENT Right 06/02/2016   Procedure: INSERTION PORT-A-CATH WITH ULTRASOUND GUIDANCE;  Surgeon: Tanya Bookbinder, MD;  Location: [place];  Service: General;  Laterality: Right;  . TUBAL LIGATION      FAMILY HISTORY Family History  Problem Relation Age of Onset  . Breast cancer Mother 49  The patient's father had a history of prostate cancer. He died at age 62. The patient's mother died at age 64 from unclear causes. She had a history of breast cancer diagnosed in her 61s. The patient had 4 brothers, 2 sisters. There is no other history of breast or ovarian cancer in the family to the patient's knowledge   GYNECOLOGIC HISTORY:  No LMP recorded. Patient is postmenopausal. Menarche age 53, first live birth age 16, the patient is [place] P4. She stopped having periods approximately 2006. She did not use hormone replacement. She used oral contraceptives briefly and remotely, with no complications.   SOCIAL HISTORY:  The patient is originally from [name]. 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 The patient's husband Tanya [name] was also a professor at [employer], where he taught agriculture oh research. Son Tanya [name] works in Land, son Tanya [name] works for [employer], daughter Tanya [name] is a Education officer, museum, and daughter Tanya [name] is also a Education officer, museum, all living in [place]. The patient has 3 grandchildren. She attends [employer]    ADVANCED DIRECTIVES: Not in place   HEALTH  MAINTENANCE: Social History   Tobacco Use  . Smoking status: Never Smoker  . Smokeless tobacco: Never Used  Vaping Use  . Vaping Use: Never used  Substance Use Topics  . Alcohol use: Yes    Comment: rarely  . Drug use: No     Colonoscopy:Never  PAP: November 2017  Bone density: Never   Allergies  Allergen Reactions  . Ascorbic Acid & Derivatives Other (See Comments)    Mouth sores (Vitamin C) with higher doses  . Camoquin [Amodiaquine] Itching    Current Outpatient Medications  Medication Sig Dispense Refill  . anastrozole (ARIMIDEX) 1 MG tablet TAKE 1 TABLET BY MOUTH EVERY DAY 90 tablet 4  . citalopram (CELEXA) 10 MG tablet Take 1 tablet (10 mg total) by mouth daily. 30 tablet 6  . losartan (COZAAR) 50 MG tablet Take 1 tablet by mouth once daily 90 tablet 0  . MELATONIN PO Take by mouth.    . Multiple Vitamin (MULTIVITAMIN WITH MINERALS) TABS tablet Take 1 tablet by mouth 3 (three) times a week. One-A-Day Women's 50+    . Polyethylene Glycol 3350 (MIRALAX PO) Take by mouth.    . pravastatin (PRAVACHOL) 40 MG tablet TAKE 1 TABLET  BY MOUTH EVERYDAY AT BEDTIME 90 tablet 2  . traZODone (DESYREL) 50 MG tablet TAKE 0.5 TABLETS (25 MG TOTAL) BY MOUTH AT BEDTIME AS NEEDED FOR SLEEP. 45 tablet 1  . VITAMIN D PO Take by mouth. 1000 IU daily     No current facility-administered medications for this visit.    OBJECTIVE: Tanya [name] woman who appears stated age  64:   01/28/20 1235  BP: 132/88  Pulse: 83  Resp: 18  Temp: (!) 97 F (36.1 C)  SpO2: 100%   Wt Readings from Last 3 Encounters:  01/28/20 175 lb 3.2 oz (79.5 kg)  08/05/19 170 lb 5 oz (77.3 kg)  11/20/18 175 lb 6.4 oz (79.6 kg)   Body mass index is 30.07 kg/m.    ECOG FS:1 - Symptomatic but completely ambulatory  Sclerae unicteric, EOMs intact Wearing a mask No cervical or supraclavicular adenopathy Lungs no rales or rhonchi Heart regular rate and rhythm Abd soft, nontender, positive bowel sounds MSK  no focal spinal tenderness, no upper extremity lymphedema Neuro: nonfocal, well oriented, appropriate affect Breasts: The right breast is unremarkable.  The left breast is status post lumpectomy and radiation.  There is no evidence of local recurrence.  Both axillae are benign.   LAB RESULTS:  CMP     Component Value Date/Time   NA 141 11/20/2018 1336   NA 145 (H) 08/17/2018 1524   NA 141 01/18/2017 1425   K 4.3 11/20/2018 1336   K 3.8 01/18/2017 1425   CL 103 11/20/2018 1336   CO2 27 11/20/2018 1336   CO2 27 01/18/2017 1425   GLUCOSE 106 (H) 11/20/2018 1336   GLUCOSE 107 01/18/2017 1425   BUN 17 11/20/2018 1336   BUN 14 08/17/2018 1524   BUN 12.5 01/18/2017 1425   CREATININE 1.14 (H) 11/20/2018 1336   CREATININE 0.8 01/18/2017 1425   CALCIUM 9.5 11/20/2018 1336   CALCIUM 9.0 01/18/2017 1425   PROT 7.3 11/20/2018 1336   PROT 7.1 08/17/2018 1524   PROT 6.7 01/18/2017 1425   ALBUMIN 4.2 11/20/2018 1336   ALBUMIN 4.5 08/17/2018 1524   ALBUMIN 3.7 01/18/2017 1425   AST 16 11/20/2018 1336   AST 19 01/18/2017 1425   ALT 16 11/20/2018 1336   ALT 18 01/18/2017 1425   ALKPHOS 82 11/20/2018 1336   ALKPHOS 73 01/18/2017 1425   BILITOT 0.4 11/20/2018 1336   BILITOT 0.4 08/17/2018 1524   BILITOT 0.30 01/18/2017 1425   GFRNONAA 51 (L) 11/20/2018 1336   GFRAA 60 (L) 11/20/2018 1336    INo results found for: SPEP, UPEP  CBC    Component Value Date/Time   WBC 5.0 01/28/2020 1226   RBC 5.22 (H) 01/28/2020 1226   HGB 13.7 01/28/2020 1226   HGB 14.0 08/17/2018 1524   HGB 12.5 01/18/2017 1425   HCT 42.3 01/28/2020 1226   HCT 41.0 08/17/2018 1524   HCT 39.5 01/18/2017 1425   PLT 179 01/28/2020 1226   PLT 181 08/17/2018 1524   MCV 81.0 01/28/2020 1226   MCV 78 (L) 08/17/2018 1524   MCV 77.1 (L) 01/18/2017 1425   MCH 26.2 01/28/2020 1226   MCHC 32.4 01/28/2020 1226   RDW 15.0 01/28/2020 1226   RDW 14.9 08/17/2018 1524   RDW 18.1 (H) 01/18/2017 1425   LYMPHSABS 2.1  01/28/2020 1226   LYMPHSABS 0.9 01/18/2017 1425   MONOABS 0.4 01/28/2020 1226   MONOABS 0.5 01/18/2017 1425   EOSABS 0.1 01/28/2020 1226   EOSABS 0.1 01/18/2017  1425   BASOSABS 0.1 01/28/2020 1226   BASOSABS 0.0 01/18/2017 1425        Chemistry      Component Value Date/Time   NA 141 11/20/2018 1336   NA 145 (H) 08/17/2018 1524   NA 141 01/18/2017 1425   K 4.3 11/20/2018 1336   K 3.8 01/18/2017 1425   CL 103 11/20/2018 1336   CO2 27 11/20/2018 1336   CO2 27 01/18/2017 1425   BUN 17 11/20/2018 1336   BUN 14 08/17/2018 1524   BUN 12.5 01/18/2017 1425   CREATININE 1.14 (H) 11/20/2018 1336   CREATININE 0.8 01/18/2017 1425      Component Value Date/Time   CALCIUM 9.5 11/20/2018 1336   CALCIUM 9.0 01/18/2017 1425   ALKPHOS 82 11/20/2018 1336   ALKPHOS 73 01/18/2017 1425   AST 16 11/20/2018 1336   AST 19 01/18/2017 1425   ALT 16 11/20/2018 1336   ALT 18 01/18/2017 1425   BILITOT 0.4 11/20/2018 1336   BILITOT 0.4 08/17/2018 1524   BILITOT 0.30 01/18/2017 1425       No results found for: LABCA2  No components found for: LABCA125  No results for input(s): INR in the last 168 hours.  Urinalysis    Component Value Date/Time   COLORURINE yellow 06/01/2006 1116   APPEARANCEUR Clear 06/01/2006 1116   LABSPEC 1.010 08/18/2016 1217   PHURINE 6.5 08/18/2016 1217   PHURINE 6.5 06/01/2006 1116   GLUCOSEU Negative 08/18/2016 1217   HGBUR Negative 08/18/2016 1217   HGBUR negative 06/01/2006 1116   BILIRUBINUR Negative 08/18/2016 1217   KETONESUR Negative 08/18/2016 1217   PROTEINUR Negative 08/18/2016 1217   UROBILINOGEN 0.2 08/18/2016 1217   NITRITE Negative 08/18/2016 1217   NITRITE negative 06/01/2006 1116   LEUKOCYTESUR Negative 08/18/2016 1217    STUDIES: No results found.   ELIGIBLE FOR AVAILABLE RESEARCH PROTOCOL: Not a candidate for Prevent (already on a statin drug)  ASSESSMENT: 64 y.o. [place] woman status post left breast upper inner quadrant and  left axillary lymph node biopsy February 2018, both showing invasive ductal carcinoma, grade 3, clinically T1c N1a, or stage IIB, estrogen receptor positive, progesterone receptor and HER-2 negative, with an MIB-1 of 40%  (a) MRI biopsy 05/25/2016 of a second area of concern in the left breast also shows invasive ductal carcinoma  (1) neoadjuvant anastrozole started 05/11/2016, held at the start of chemotherapy  (2) Mammaprint results shows the tumor to be luminal B, high risk, indicating a need for chemotherapy.  (3) chemotherapy consisting of doxorubicin and cyclophosphamide in dose dense fashion 4 starting 06/09/2016, completed 07/21/2016, followed by weekly paclitaxel started 08/11/2016  (a) paclitaxel stopped after cycle 8 because of neuropathy; last dose 09/29/2016  (4) left lumpectomy and sentinel lymph node sampling 11/03/2016 showed no residual tumor in the breast. One of 2 sentinel lymph nodes was involved by tumor. Margins were clear. pT0 pN1  (5) adjuvant radiation completed 02/01/2017 1. Left breast and supraclavicular 50.4 Gy in 28 fractions 2. Boost to the seroma 10 Gy in 5 fractions Total of 60.4 Gy  (6)  resumed anastrozole 02/11/2017   PLAN: Tanasha is now a little over 3 years out from definitive surgery for her breast cancer with no evidence of disease recurrence.  This is very febrile.  She is tolerating anastrozole well and the plan is to continue that an additional 2 years.  We discussed anxiety issues.  She would like to try Escitalopram.  I wrote her a very  low dose.  I asked her to call my nurse if she has any side effects.  She wants to know if she is low in B12 although she does not give me any symptoms of it.  She also wants to know her blood type.  Again we do not need that for medical reason but she is interested in finding out and I have written the orders for both those labs to be drawn today.  I suggested she call the company that provided her CPAP so  she can come to the house and again teach her how to use the machine since she really does not know how to do that.  Otherwise she will see [place] again in 6 and 12 months.  She knows to call for any other issue that may develop before then  Total encounter time 35 minutes.*   Radiance Deady, Tanya Dad, MD  01/28/20 1:08 PM Medical Oncology and Hematology [facility] [place], [place] [address] Tel. [phone]    Fax. [phone]   I, Wilburn Mylar, am acting as scribe for Dr. Virgie [name]. Kristilyn Coltrane.  I, Lurline Del MD, have reviewed the above documentation for accuracy and completeness, and I agree with the above.    *Total Encounter Time as defined by the Centers for Medicare and Medicaid Services includes, in addition to the face-to-face time of a patient visit (documented in the note above) non-face-to-face time: obtaining and reviewing outside history, ordering and reviewing medications, tests or procedures, care coordination (communications with other health care professionals or caregivers) and documentation in the medical record.

## 2020-01-28 ENCOUNTER — Inpatient Hospital Stay (HOSPITAL_BASED_OUTPATIENT_CLINIC_OR_DEPARTMENT_OTHER): Payer: 59 | Admitting: Oncology

## 2020-01-28 ENCOUNTER — Inpatient Hospital Stay: Payer: 59

## 2020-01-28 ENCOUNTER — Encounter: Payer: Self-pay | Admitting: Oncology

## 2020-01-28 ENCOUNTER — Other Ambulatory Visit: Payer: Self-pay

## 2020-01-28 ENCOUNTER — Inpatient Hospital Stay: Payer: 59 | Attending: Oncology

## 2020-01-28 VITALS — BP 132/88 | HR 83 | Temp 97.0°F | Resp 18 | Ht 64.0 in | Wt 175.2 lb

## 2020-01-28 DIAGNOSIS — Z170 Estrogen receptor positive status [ER+]: Secondary | ICD-10-CM | POA: Diagnosis not present

## 2020-01-28 DIAGNOSIS — Z9221 Personal history of antineoplastic chemotherapy: Secondary | ICD-10-CM | POA: Diagnosis not present

## 2020-01-28 DIAGNOSIS — Z923 Personal history of irradiation: Secondary | ICD-10-CM | POA: Diagnosis not present

## 2020-01-28 DIAGNOSIS — C50412 Malignant neoplasm of upper-outer quadrant of left female breast: Secondary | ICD-10-CM

## 2020-01-28 DIAGNOSIS — R682 Dry mouth, unspecified: Secondary | ICD-10-CM | POA: Diagnosis not present

## 2020-01-28 DIAGNOSIS — Z803 Family history of malignant neoplasm of breast: Secondary | ICD-10-CM | POA: Insufficient documentation

## 2020-01-28 DIAGNOSIS — Z23 Encounter for immunization: Secondary | ICD-10-CM

## 2020-01-28 DIAGNOSIS — F419 Anxiety disorder, unspecified: Secondary | ICD-10-CM | POA: Insufficient documentation

## 2020-01-28 DIAGNOSIS — Z79899 Other long term (current) drug therapy: Secondary | ICD-10-CM | POA: Insufficient documentation

## 2020-01-28 DIAGNOSIS — Z8042 Family history of malignant neoplasm of prostate: Secondary | ICD-10-CM | POA: Insufficient documentation

## 2020-01-28 LAB — TYPE AND SCREEN
ABO/RH(D): O POS
Antibody Screen: NEGATIVE

## 2020-01-28 LAB — CBC WITH DIFFERENTIAL/PLATELET
Abs Immature Granulocytes: 0.01 10*3/uL (ref 0.00–0.07)
Basophils Absolute: 0.1 10*3/uL (ref 0.0–0.1)
Basophils Relative: 1 %
Eosinophils Absolute: 0.1 10*3/uL (ref 0.0–0.5)
Eosinophils Relative: 1 %
HCT: 42.3 % (ref 36.0–46.0)
Hemoglobin: 13.7 g/dL (ref 12.0–15.0)
Immature Granulocytes: 0 %
Lymphocytes Relative: 42 %
Lymphs Abs: 2.1 10*3/uL (ref 0.7–4.0)
MCH: 26.2 pg (ref 26.0–34.0)
MCHC: 32.4 g/dL (ref 30.0–36.0)
MCV: 81 fL (ref 80.0–100.0)
Monocytes Absolute: 0.4 10*3/uL (ref 0.1–1.0)
Monocytes Relative: 8 %
Neutro Abs: 2.4 10*3/uL (ref 1.7–7.7)
Neutrophils Relative %: 48 %
Platelets: 179 10*3/uL (ref 150–400)
RBC: 5.22 MIL/uL — ABNORMAL HIGH (ref 3.87–5.11)
RDW: 15 % (ref 11.5–15.5)
WBC: 5 10*3/uL (ref 4.0–10.5)
nRBC: 0 % (ref 0.0–0.2)

## 2020-01-28 LAB — VITAMIN B12: Vitamin B-12: 563 pg/mL (ref 180–914)

## 2020-01-28 LAB — ABO/RH: ABO/RH(D): O POS

## 2020-01-28 MED ORDER — CITALOPRAM HYDROBROMIDE 10 MG PO TABS: 10.0000 mg | ORAL_TABLET | Freq: Every day | ORAL | 6 refills | Status: DC

## 2020-01-30 ENCOUNTER — Telehealth: Payer: Self-pay | Admitting: Oncology

## 2020-01-30 NOTE — Telephone Encounter (Signed)
Scheduled appts per 11/18 los. Pt confirmed appt dates and times.

## 2020-02-11 ENCOUNTER — Telehealth: Payer: Self-pay

## 2020-02-11 NOTE — Telephone Encounter (Signed)
Pt called to ask about lab results from 01/28/20. Results reviewed with pt and she verbalizes understanding.

## 2020-02-22 ENCOUNTER — Other Ambulatory Visit: Payer: Self-pay | Admitting: Oncology

## 2020-07-27 ENCOUNTER — Telehealth: Payer: Self-pay | Admitting: Adult Health

## 2020-07-27 NOTE — Telephone Encounter (Signed)
R/s per 5/17 los, pt aware

## 2020-07-28 ENCOUNTER — Other Ambulatory Visit: Payer: 59

## 2020-07-28 ENCOUNTER — Ambulatory Visit: Payer: 59 | Admitting: Adult Health

## 2020-08-06 ENCOUNTER — Inpatient Hospital Stay (HOSPITAL_BASED_OUTPATIENT_CLINIC_OR_DEPARTMENT_OTHER): Payer: 59 | Admitting: Adult Health

## 2020-08-06 ENCOUNTER — Inpatient Hospital Stay: Payer: 59

## 2020-08-06 ENCOUNTER — Encounter: Payer: Self-pay | Admitting: Adult Health

## 2020-08-06 ENCOUNTER — Inpatient Hospital Stay: Payer: 59 | Attending: Adult Health

## 2020-08-06 ENCOUNTER — Other Ambulatory Visit: Payer: Self-pay

## 2020-08-06 VITALS — BP 150/94 | HR 90 | Temp 97.9°F | Resp 18 | Ht 64.0 in | Wt 175.6 lb

## 2020-08-06 DIAGNOSIS — R350 Frequency of micturition: Secondary | ICD-10-CM

## 2020-08-06 DIAGNOSIS — Z170 Estrogen receptor positive status [ER+]: Secondary | ICD-10-CM

## 2020-08-06 DIAGNOSIS — Z9221 Personal history of antineoplastic chemotherapy: Secondary | ICD-10-CM | POA: Insufficient documentation

## 2020-08-06 DIAGNOSIS — C50412 Malignant neoplasm of upper-outer quadrant of left female breast: Secondary | ICD-10-CM | POA: Diagnosis present

## 2020-08-06 DIAGNOSIS — G629 Polyneuropathy, unspecified: Secondary | ICD-10-CM | POA: Insufficient documentation

## 2020-08-06 DIAGNOSIS — Z79899 Other long term (current) drug therapy: Secondary | ICD-10-CM | POA: Diagnosis not present

## 2020-08-06 DIAGNOSIS — Z923 Personal history of irradiation: Secondary | ICD-10-CM | POA: Diagnosis not present

## 2020-08-06 DIAGNOSIS — Z803 Family history of malignant neoplasm of breast: Secondary | ICD-10-CM | POA: Diagnosis not present

## 2020-08-06 DIAGNOSIS — Z79811 Long term (current) use of aromatase inhibitors: Secondary | ICD-10-CM | POA: Insufficient documentation

## 2020-08-06 DIAGNOSIS — M5442 Lumbago with sciatica, left side: Secondary | ICD-10-CM | POA: Diagnosis not present

## 2020-08-06 DIAGNOSIS — N390 Urinary tract infection, site not specified: Secondary | ICD-10-CM | POA: Diagnosis not present

## 2020-08-06 DIAGNOSIS — C773 Secondary and unspecified malignant neoplasm of axilla and upper limb lymph nodes: Secondary | ICD-10-CM | POA: Diagnosis not present

## 2020-08-06 DIAGNOSIS — N644 Mastodynia: Secondary | ICD-10-CM | POA: Insufficient documentation

## 2020-08-06 LAB — CBC WITH DIFFERENTIAL/PLATELET
Abs Immature Granulocytes: 0.02 10*3/uL (ref 0.00–0.07)
Basophils Absolute: 0.1 10*3/uL (ref 0.0–0.1)
Basophils Relative: 1 %
Eosinophils Absolute: 0.1 10*3/uL (ref 0.0–0.5)
Eosinophils Relative: 1 %
HCT: 39.5 % (ref 36.0–46.0)
Hemoglobin: 12.8 g/dL (ref 12.0–15.0)
Immature Granulocytes: 0 %
Lymphocytes Relative: 39 %
Lymphs Abs: 2.5 10*3/uL (ref 0.7–4.0)
MCH: 26.7 pg (ref 26.0–34.0)
MCHC: 32.4 g/dL (ref 30.0–36.0)
MCV: 82.5 fL (ref 80.0–100.0)
Monocytes Absolute: 0.5 10*3/uL (ref 0.1–1.0)
Monocytes Relative: 8 %
Neutro Abs: 3.3 10*3/uL (ref 1.7–7.7)
Neutrophils Relative %: 51 %
Platelets: 156 10*3/uL (ref 150–400)
RBC: 4.79 MIL/uL (ref 3.87–5.11)
RDW: 14.5 % (ref 11.5–15.5)
WBC: 6.4 10*3/uL (ref 4.0–10.5)
nRBC: 0 % (ref 0.0–0.2)

## 2020-08-06 LAB — URINALYSIS, COMPLETE (UACMP) WITH MICROSCOPIC
Bilirubin Urine: NEGATIVE
Glucose, UA: NEGATIVE mg/dL
Hgb urine dipstick: NEGATIVE
Ketones, ur: NEGATIVE mg/dL
Leukocytes,Ua: NEGATIVE
Nitrite: NEGATIVE
Protein, ur: NEGATIVE mg/dL
Specific Gravity, Urine: 1.014 (ref 1.005–1.030)
pH: 5 (ref 5.0–8.0)

## 2020-08-06 MED ORDER — METHYLPREDNISOLONE 4 MG PO TBPK: ORAL_TABLET | ORAL | 0 refills | Status: DC

## 2020-08-06 NOTE — Progress Notes (Signed)
[facility]  [phone] [phone]     ID: Tanya [name] DOB: 1956-01-12  MR#: [id number]  BPZ#:[id number]  Patient Care Team: Haywood Pao, MD as PCP - General (Internal Medicine) Rolm Bookbinder, MD as Consulting Physician (General Surgery) Magrinat, Virgie Dad, MD as Consulting Physician (Oncology) Kyung Rudd, MD as Consulting Physician (Radiation Oncology) OTHER MD:  CHIEF COMPLAINT: Estrogen receptor positive breast cancer  CURRENT TREATMENT: anastrozole   INTERVAL HISTORY: Tanya [name] returns today for follow-up of her estrogen receptor positive breast cancer.   She continues on anastrozole. She tolerates this moderately well.  She denies increased hot flashes or arthralgias.  She is experiencing some left sided breast pain she is fearful that since it has continued it may mean that she has a recurrence.  This pain is chronic, improving, and has been present since her surgery.    REVIEW OF SYSTEMS: Tanya [name] is overall doing quite well.  She fell on good Friday before Easter.  She fell down the stairs, and blacked out.  She says it was a small fall.  She had some lower back pain after this.  She says that she saw her PCP and took Alevel daily and that didn't work.  She increased her intake to Aleve BID and it is slightly better, however still persistently there.  She notes the pain is located in her left lower back.  She has had some urinary frequency and wonders if she may also have a UTI.      COVID 19 VACCINATION STATUS: Status post Pfizer x2, with the booster shot scheduled for today 01/28/2020   BREAST CANCER HISTORY: From the original intake note:  Aneka had routine bilateral screening mammography at the [facility] 04/19/2016. This showed a possible area of the symmetry in the left breast and a prominent left axillary lymph nodes. On 04/28/2016 she underwent diagnostic left mammography with tomography and left breast  ultrasonography. This showed the breast density to be category B. There was an obscured mass in the upper outer left breast and an enlarged left axillary lymph node. On exam there was focal thickening at the 2:00 position of the left breast 9 cm from the nipple. Targeted ultrasonography confirmed a 1.8 cm hypoechoic mass with a second adjacent 1.1 cm mass both at the 2:00 position of the left breast 9 cm from the nipple. The conglomerate area measured 3.5 cm. There was also an enlarged left axillary lymph node with loss of fatty hilum measuring 1.8 cm.  On family 16 2018 the patient underwent biopsy of the larger left breast mass in question and the suspicious left axillary lymph node. 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  The patient's subsequent history is as detailed below   PAST MEDICAL HISTORY: Past Medical History:  Diagnosis Date  . ANXIETY   . Arthritis   . Dyspnea on exertion   . HYPERLIPIDEMIA   . HYPERTENSION, BENIGN ESSENTIAL   . Malignant neoplasm of upper-outer quadrant of left breast in female, estrogen receptor positive ([place]) 05/04/2016  . OBESITY   . Personal history of chemotherapy   . Personal history of radiation therapy   . Pre-diabetes    no medications    PAST SURGICAL HISTORY: Past Surgical History:  Procedure Laterality Date  . BREAST BIOPSY Left 04/29/2016  .  BREAST BIOPSY Left 05/25/2016  . BREAST LUMPECTOMY Left 10/2016  . BREAST LUMPECTOMY WITH RADIOACTIVE SEED AND SENTINEL LYMPH NODE BIOPSY Left 11/03/2016   Procedure: INJECT BLUE DYE LEFT BREAST, LEFT BREAST RADIOACTIVE SEED GUIDED LUMPECTOMY WITH LEFT SENTINEL LYMPH NODE BIOPSY AND RADIOACTIVE SEED TARGETED AXILLARY LYMPH NODE EXCISION;  Surgeon: Rolm Bookbinder, MD;  Location:  [facility];  Service: General;  Laterality: Left;  . CESAREAN SECTION    . PORT-A-CATH REMOVAL Right 11/03/2016   Procedure: REMOVAL PORT-A-CATH;  Surgeon: Rolm Bookbinder, MD;  Location: [facility];  Service: General;  Laterality: Right;  . PORTACATH PLACEMENT Right 06/02/2016   Procedure: INSERTION PORT-A-CATH WITH ULTRASOUND GUIDANCE;  Surgeon: Rolm Bookbinder, MD;  Location: [facility];  Service: General;  Laterality: Right;  . TUBAL LIGATION      FAMILY HISTORY Family History  Problem Relation Age of Onset  . Breast cancer Mother 57  The patient's father had a history of prostate cancer. He died at age 81. The patient's mother died at age 68 from unclear causes. She had a history of breast cancer diagnosed in her 18s. The patient had 4 brothers, 2 sisters. There is no other history of breast or ovarian cancer in the family to the patient's knowledge   GYNECOLOGIC HISTORY:  No LMP recorded. Patient is postmenopausal. Menarche age 33, first live birth age 89, the patient is [place] P4. She stopped having periods approximately 2006. She did not use hormone replacement. She used oral contraceptives briefly and remotely, with no complications.   SOCIAL HISTORY:  The patient is originally from [name]. 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 The patient's husband [name] was also a professor at [employer], where he taught agriculture oh research. Son [name] works in Land, son [name] works for [employer], daughter [name] is a Education officer, museum, and daughter [name] is also a Education officer, museum, all living in [place]. The patient has 3 grandchildren. She attends [employer]    ADVANCED DIRECTIVES: Not in place   HEALTH MAINTENANCE: Social History   Tobacco Use  . Smoking status: Never Smoker  . Smokeless tobacco: Never Used  Vaping Use  . Vaping Use: Never used  Substance Use Topics  . Alcohol use: Yes    Comment: rarely  . Drug use: No      Colonoscopy:Never  PAP: November 2017  Bone density: Never   Allergies  Allergen Reactions  . Ascorbic Acid & Derivatives Other (See Comments)    Mouth sores (Vitamin C) with higher doses  . Camoquin [Amodiaquine] Itching    Current Outpatient Medications  Medication Sig Dispense Refill  . amLODipine (NORVASC) 5 MG tablet Take 1 tablet by mouth daily.    Marland Kitchen anastrozole (ARIMIDEX) 1 MG tablet TAKE 1 TABLET BY MOUTH EVERY DAY 90 tablet 4  . citalopram (CELEXA) 10 MG tablet TAKE 1 TABLET BY MOUTH EVERY DAY 90 tablet 3  . losartan (COZAAR) 50 MG tablet Take 1 tablet by mouth once daily 90 tablet 0  . Multiple Vitamin (MULTIVITAMIN WITH MINERALS) TABS tablet Take 1 tablet by mouth 3 (three) times a week. One-A-Day Women's 50+    . Polyethylene Glycol 3350 (MIRALAX PO) Take by mouth.    . pravastatin (PRAVACHOL) 40 MG tablet TAKE 1 TABLET BY MOUTH EVERYDAY AT BEDTIME 90 tablet 2  . VITAMIN D PO Take by mouth. 1000 IU daily    . MELATONIN PO Take by mouth. (Patient not taking: Reported on 08/06/2020)    .  traZODone (DESYREL) 50 MG tablet TAKE 0.5 TABLETS (25 MG TOTAL) BY MOUTH AT BEDTIME AS NEEDED FOR SLEEP. (Patient not taking: Reported on 08/06/2020) 45 tablet 1   No current facility-administered medications for this visit.    OBJECTIVE: Tanya [name] woman who appears stated age  13:   08/06/20 1256  BP: (!) 150/94  Pulse: 90  Resp: 18  Temp: 97.9 F (36.6 C)  SpO2: 100%   Wt Readings from Last 3 Encounters:  08/06/20 175 lb 9.6 oz (79.7 kg)  01/28/20 175 lb 3.2 oz (79.5 kg)  08/05/19 170 lb 5 oz (77.3 kg)   Body mass index is 30.14 kg/m.    ECOG FS:1 - Symptomatic but completely ambulatory GENERAL: Patient is a well appearing female in no acute distress HEENT:  Sclerae anicteric.  Oropharynx clear and moist. No ulcerations or evidence of oropharyngeal candidiasis. Neck is supple.  NODES:  No cervical, supraclavicular, or axillary lymphadenopathy palpated.  BREAST  EXAM:  Left breast s/p lumpectomy and radiation, no sign of local recurrence, right breast benign LUNGS:  Clear to auscultation bilaterally.  No wheezes or rhonchi. HEART:  Regular rate and rhythm. No murmur appreciated. ABDOMEN:  Soft, nontender.  Positive, normoactive bowel sounds. No organomegaly palpated. MSK:  No focal spinal tenderness to palpation. Full range of motion bilaterally in the upper extremities. + TTP over left sciatic notch EXTREMITIES:  No peripheral edema.   SKIN:  Clear with no obvious rashes or skin changes. No nail dyscrasia. NEURO:  Nonfocal. Well oriented.  Appropriate affect.      LAB RESULTS:  CMP     Component Value Date/Time   NA 141 11/20/2018 1336   NA 145 (H) 08/17/2018 1524   NA 141 01/18/2017 1425   K 4.3 11/20/2018 1336   K 3.8 01/18/2017 1425   CL 103 11/20/2018 1336   CO2 27 11/20/2018 1336   CO2 27 01/18/2017 1425   GLUCOSE 106 (H) 11/20/2018 1336   GLUCOSE 107 01/18/2017 1425   BUN 17 11/20/2018 1336   BUN 14 08/17/2018 1524   BUN 12.5 01/18/2017 1425   CREATININE 1.14 (H) 11/20/2018 1336   CREATININE 0.8 01/18/2017 1425   CALCIUM 9.5 11/20/2018 1336   CALCIUM 9.0 01/18/2017 1425   PROT 7.3 11/20/2018 1336   PROT 7.1 08/17/2018 1524   PROT 6.7 01/18/2017 1425   ALBUMIN 4.2 11/20/2018 1336   ALBUMIN 4.5 08/17/2018 1524   ALBUMIN 3.7 01/18/2017 1425   AST 16 11/20/2018 1336   AST 19 01/18/2017 1425   ALT 16 11/20/2018 1336   ALT 18 01/18/2017 1425   ALKPHOS 82 11/20/2018 1336   ALKPHOS 73 01/18/2017 1425   BILITOT 0.4 11/20/2018 1336   BILITOT 0.4 08/17/2018 1524   BILITOT 0.30 01/18/2017 1425   GFRNONAA 51 (L) 11/20/2018 1336   GFRAA 60 (L) 11/20/2018 1336    INo results found for: SPEP, UPEP  CBC    Component Value Date/Time   WBC 6.4 08/06/2020 1240   RBC 4.79 08/06/2020 1240   HGB 12.8 08/06/2020 1240   HGB 14.0 08/17/2018 1524   HGB 12.5 01/18/2017 1425   HCT 39.5 08/06/2020 1240   HCT 41.0 08/17/2018 1524    HCT 39.5 01/18/2017 1425   PLT 156 08/06/2020 1240   PLT 181 08/17/2018 1524   MCV 82.5 08/06/2020 1240   MCV 78 (L) 08/17/2018 1524   MCV 77.1 (L) 01/18/2017 1425   MCH 26.7 08/06/2020 1240   MCHC 32.4 08/06/2020 1240  RDW 14.5 08/06/2020 1240   RDW 14.9 08/17/2018 1524   RDW 18.1 (H) 01/18/2017 1425   LYMPHSABS 2.5 08/06/2020 1240   LYMPHSABS 0.9 01/18/2017 1425   MONOABS 0.5 08/06/2020 1240   MONOABS 0.5 01/18/2017 1425   EOSABS 0.1 08/06/2020 1240   EOSABS 0.1 01/18/2017 1425   BASOSABS 0.1 08/06/2020 1240   BASOSABS 0.0 01/18/2017 1425        Chemistry      Component Value Date/Time   NA 141 11/20/2018 1336   NA 145 (H) 08/17/2018 1524   NA 141 01/18/2017 1425   K 4.3 11/20/2018 1336   K 3.8 01/18/2017 1425   CL 103 11/20/2018 1336   CO2 27 11/20/2018 1336   CO2 27 01/18/2017 1425   BUN 17 11/20/2018 1336   BUN 14 08/17/2018 1524   BUN 12.5 01/18/2017 1425   CREATININE 1.14 (H) 11/20/2018 1336   CREATININE 0.8 01/18/2017 1425      Component Value Date/Time   CALCIUM 9.5 11/20/2018 1336   CALCIUM 9.0 01/18/2017 1425   ALKPHOS 82 11/20/2018 1336   ALKPHOS 73 01/18/2017 1425   AST 16 11/20/2018 1336   AST 19 01/18/2017 1425   ALT 16 11/20/2018 1336   ALT 18 01/18/2017 1425   BILITOT 0.4 11/20/2018 1336   BILITOT 0.4 08/17/2018 1524   BILITOT 0.30 01/18/2017 1425       No results found for: LABCA2  No components found for: LABCA125  No results for input(s): INR in the last 168 hours.  Urinalysis    Component Value Date/Time   COLORURINE yellow 06/01/2006 1116   APPEARANCEUR Clear 06/01/2006 1116   LABSPEC 1.010 08/18/2016 1217   PHURINE 6.5 08/18/2016 1217   PHURINE 6.5 06/01/2006 1116   GLUCOSEU Negative 08/18/2016 1217   HGBUR Negative 08/18/2016 1217   HGBUR negative 06/01/2006 1116   BILIRUBINUR Negative 08/18/2016 1217   KETONESUR Negative 08/18/2016 1217   PROTEINUR Negative 08/18/2016 1217   UROBILINOGEN 0.2 08/18/2016 1217    NITRITE Negative 08/18/2016 1217   NITRITE negative 06/01/2006 1116   LEUKOCYTESUR Negative 08/18/2016 1217    STUDIES: No results found.   ELIGIBLE FOR AVAILABLE RESEARCH PROTOCOL: Not a candidate for Prevent (already on a statin drug)  ASSESSMENT: 65 y.o. [place] woman status post left breast upper inner quadrant and left axillary lymph node biopsy February 2018, both showing invasive ductal carcinoma, grade 3, clinically T1c N1a, or stage IIB, estrogen receptor positive, progesterone receptor and HER-2 negative, with an MIB-1 of 40%  (a) MRI biopsy 05/25/2016 of a second area of concern in the left breast also shows invasive ductal carcinoma  (1) neoadjuvant anastrozole started 05/11/2016, held at the start of chemotherapy  (2) Mammaprint results shows the tumor to be luminal B, high risk, indicating a need for chemotherapy.  (3) chemotherapy consisting of doxorubicin and cyclophosphamide in dose dense fashion 4 starting 06/09/2016, completed 07/21/2016, followed by weekly paclitaxel started 08/11/2016  (a) paclitaxel stopped after cycle 8 because of neuropathy; last dose 09/29/2016  (4) left lumpectomy and sentinel lymph node sampling 11/03/2016 showed no residual tumor in the breast. One of 2 sentinel lymph nodes was involved by tumor. Margins were clear. pT0 pN1  (5) adjuvant radiation completed 02/01/2017 1. Left breast and supraclavicular 50.4 Gy in 28 fractions 2. Boost to the seroma 10 Gy in 5 fractions Total of 60.4 Gy  (6)  resumed anastrozole 02/11/2017   PLAN: Kyleena continues on Anastrozole with good tolerance.  She will  continue this.  We discussed that she should take this for at least 5 years, which will bring her to 07/2021, at that point, we will discuss extending therapy to 7 years which she very well may benefit from considering her initial extent of cancer involvement and good tolerance of the medication thus far.    She has no clinical or radiographic  sign of recurrence.  I gave Naryiah reassurance about her breast pain.  This has been present since her surgery.  She will continue to undergo annual mammograms for breast cancer surveillance.   Her back pain is likely some sciatica from her fall.  Since the aleve helped it some, but not completely, I recommended she stop taking it.  I prescribed a medrol dosepak to help decrease inflammation.  If that doesn't work she will reach out to Dr. Georgina Snell in sports medicine for further evaluation, or go back to her PCP for further f/u.  She was concerned that she was experiencing a UTI, so we sent off a urinalysis to rule that out.    Acacia was recommended to continue with PCP follow up, we discussed her health maintenance, and I recommended she continue with annual mammograms.  She will return in 6 months for labs and f/u.  She knows to call for any questions that may arise between now and her next appointment.  We are happy to see her sooner if needed.  Total encounter time 45 minutes.* in face to face visit time, order entry, lab review, imaging review, and documentation.  Wilber Bihari, NP 08/06/20 1:07 PM Medical Oncology and Hematology [facility] [place], [place] [address] Tel. [phone]    Fax. [phone]    *Total Encounter Time as defined by the Centers for Medicare and Medicaid Services includes, in addition to the face-to-face time of a patient visit (documented in the note above) non-face-to-face time: obtaining and reviewing outside history, ordering and reviewing medications, tests or procedures, care coordination (communications with other health care professionals or caregivers) and documentation in the medical record.

## 2020-08-07 ENCOUNTER — Telehealth: Payer: Self-pay

## 2020-08-07 LAB — URINE CULTURE: Culture: NO GROWTH

## 2020-08-07 NOTE — Telephone Encounter (Signed)
-----   Message from Gardenia Phlegm, NP sent at 08/07/2020  2:36 PM EDT ----- Urinalysis and urine culture negative.  Please inform patient. ----- Message ----- From: Buel Ream, Lab In [place] Sent: 08/06/2020   2:49 PM EDT To: Gardenia Phlegm, NP

## 2020-08-07 NOTE — Telephone Encounter (Signed)
Called and given below message. She verbalized understanding. 

## 2020-08-18 ENCOUNTER — Ambulatory Visit (HOSPITAL_BASED_OUTPATIENT_CLINIC_OR_DEPARTMENT_OTHER)
Admission: RE | Admit: 2020-08-18 | Discharge: 2020-08-18 | Disposition: A | Discharge status: Home / Self Care | Payer: 59 | Source: Ambulatory Visit | Attending: Adult Health | Admitting: Adult Health

## 2020-08-18 ENCOUNTER — Other Ambulatory Visit: Payer: Self-pay

## 2020-08-18 DIAGNOSIS — C50412 Malignant neoplasm of upper-outer quadrant of left female breast: Secondary | ICD-10-CM | POA: Diagnosis not present

## 2020-08-18 DIAGNOSIS — Z170 Estrogen receptor positive status [ER+]: Secondary | ICD-10-CM | POA: Insufficient documentation

## 2020-08-20 ENCOUNTER — Telehealth: Payer: Self-pay | Admitting: *Deleted

## 2020-08-20 NOTE — Telephone Encounter (Signed)
-----   Message from Gardenia Phlegm, NP sent at 08/18/2020  1:30 PM EDT ----- Bone density normla., please notify patient ----- Message ----- From: Interface, Rad Results In Sent: 08/18/2020  12:48 PM EDT To: Gardenia Phlegm, NP

## 2020-08-20 NOTE — Telephone Encounter (Signed)
This RN called pt and informed her of results of bone density. No further questions at this time.

## 2020-10-01 ENCOUNTER — Other Ambulatory Visit: Payer: Self-pay | Admitting: Internal Medicine

## 2020-10-01 DIAGNOSIS — Z1231 Encounter for screening mammogram for malignant neoplasm of breast: Secondary | ICD-10-CM

## 2020-11-05 ENCOUNTER — Other Ambulatory Visit: Payer: Self-pay | Admitting: Internal Medicine

## 2020-11-05 DIAGNOSIS — Z853 Personal history of malignant neoplasm of breast: Secondary | ICD-10-CM

## 2020-11-12 ENCOUNTER — Encounter: Payer: Self-pay | Admitting: Internal Medicine

## 2020-11-26 ENCOUNTER — Ambulatory Visit
Admission: RE | Admit: 2020-11-26 | Discharge: 2020-11-26 | Disposition: A | Discharge status: Home / Self Care | Payer: 59 | Source: Ambulatory Visit | Attending: Internal Medicine | Admitting: Internal Medicine

## 2020-11-26 ENCOUNTER — Other Ambulatory Visit: Payer: Self-pay

## 2020-11-26 DIAGNOSIS — Z853 Personal history of malignant neoplasm of breast: Secondary | ICD-10-CM

## 2020-12-14 ENCOUNTER — Other Ambulatory Visit: Payer: Self-pay | Admitting: Oncology

## 2020-12-23 ENCOUNTER — Other Ambulatory Visit: Payer: Self-pay

## 2020-12-23 ENCOUNTER — Ambulatory Visit (AMBULATORY_SURGERY_CENTER): Payer: 59

## 2020-12-23 VITALS — Ht 64.0 in | Wt 180.0 lb

## 2020-12-23 DIAGNOSIS — Z1211 Encounter for screening for malignant neoplasm of colon: Secondary | ICD-10-CM

## 2020-12-23 MED ORDER — PEG 3350-KCL-NA BICARB-NACL 420 G PO SOLR: 4000.0000 mL | Freq: Once | ORAL | 0 refills | Status: AC

## 2020-12-23 NOTE — Progress Notes (Signed)
    Patient's pre-visit was done today over the phone with the patient   Name,DOB and address verified.   Patient denies any allergies to Eggs and Soy.  Patient denies any problems with anesthesia/sedation. Patient denies taking diet pills or blood thinners.  Denies atrial flutter or atrial fib Denies chronic constipation when asked says goes every other day. Uses Miralax occasionally No home Oxygen.   Packet of Prep instructions mailed to patient including a copy of a consent form-pt is aware.  Patient understands to call us back with any questions or concerns.  Patient is aware of our care-partner policy and [id number] safety protocol.   .  Pt is prediabetic and is not on medications  Accent is thick which made understanding difficult at times  Has Sickle cell trait  Procedure is 10/18 and needs prep instructions as soon as possible.  Offered to have the printed for he to pick up but she states she is unable to pick up.  Requested the use of My Chart which was done.  Asked pt to let [place] know if she can not access it by in the morning.

## 2020-12-24 ENCOUNTER — Telehealth: Payer: Self-pay | Admitting: Internal Medicine

## 2020-12-24 NOTE — Telephone Encounter (Signed)
Inbound call from pt requesting a call back stating that she has questions regarding her prep. Please advise. Thank you.

## 2020-12-24 NOTE — Telephone Encounter (Signed)
Pt wanted to RS her colon  Rs to 12-6  with pt- - new instructions to her MyChart and mailed as well

## 2020-12-29 ENCOUNTER — Encounter: Payer: 59 | Admitting: Internal Medicine

## 2021-02-02 ENCOUNTER — Ambulatory Visit: Payer: 59 | Admitting: Adult Health

## 2021-02-02 ENCOUNTER — Other Ambulatory Visit: Payer: 59

## 2021-02-03 ENCOUNTER — Telehealth: Payer: Self-pay | Admitting: Internal Medicine

## 2021-02-03 NOTE — Telephone Encounter (Signed)
Returned pt call. LVM requesting returned call. 

## 2021-02-03 NOTE — Telephone Encounter (Signed)
Inbound call from patient states she has some questions about her colonoscopy. Please advise.

## 2021-02-09 ENCOUNTER — Inpatient Hospital Stay: Payer: 59 | Attending: Adult Health

## 2021-02-09 ENCOUNTER — Other Ambulatory Visit: Payer: Self-pay

## 2021-02-09 ENCOUNTER — Inpatient Hospital Stay (HOSPITAL_BASED_OUTPATIENT_CLINIC_OR_DEPARTMENT_OTHER): Payer: 59 | Admitting: Adult Health

## 2021-02-09 VITALS — BP 143/89 | HR 88 | Temp 97.7°F | Resp 16 | Ht 64.0 in | Wt 179.0 lb

## 2021-02-09 DIAGNOSIS — Z79811 Long term (current) use of aromatase inhibitors: Secondary | ICD-10-CM | POA: Diagnosis not present

## 2021-02-09 DIAGNOSIS — C50412 Malignant neoplasm of upper-outer quadrant of left female breast: Secondary | ICD-10-CM | POA: Insufficient documentation

## 2021-02-09 DIAGNOSIS — Z9221 Personal history of antineoplastic chemotherapy: Secondary | ICD-10-CM | POA: Insufficient documentation

## 2021-02-09 DIAGNOSIS — Z79899 Other long term (current) drug therapy: Secondary | ICD-10-CM | POA: Insufficient documentation

## 2021-02-09 DIAGNOSIS — G629 Polyneuropathy, unspecified: Secondary | ICD-10-CM | POA: Diagnosis not present

## 2021-02-09 DIAGNOSIS — Z170 Estrogen receptor positive status [ER+]: Secondary | ICD-10-CM | POA: Diagnosis not present

## 2021-02-09 DIAGNOSIS — Z923 Personal history of irradiation: Secondary | ICD-10-CM | POA: Diagnosis not present

## 2021-02-09 DIAGNOSIS — C773 Secondary and unspecified malignant neoplasm of axilla and upper limb lymph nodes: Secondary | ICD-10-CM | POA: Diagnosis not present

## 2021-02-09 DIAGNOSIS — Z803 Family history of malignant neoplasm of breast: Secondary | ICD-10-CM | POA: Diagnosis not present

## 2021-02-09 LAB — CBC WITH DIFFERENTIAL/PLATELET
Abs Immature Granulocytes: 0.01 10*3/uL (ref 0.00–0.07)
Basophils Absolute: 0.1 10*3/uL (ref 0.0–0.1)
Basophils Relative: 1 %
Eosinophils Absolute: 0.2 10*3/uL (ref 0.0–0.5)
Eosinophils Relative: 3 %
HCT: 40.9 % (ref 36.0–46.0)
Hemoglobin: 13.1 g/dL (ref 12.0–15.0)
Immature Granulocytes: 0 %
Lymphocytes Relative: 31 %
Lymphs Abs: 2.1 10*3/uL (ref 0.7–4.0)
MCH: 26.1 pg (ref 26.0–34.0)
MCHC: 32 g/dL (ref 30.0–36.0)
MCV: 81.5 fL (ref 80.0–100.0)
Monocytes Absolute: 0.4 10*3/uL (ref 0.1–1.0)
Monocytes Relative: 6 %
Neutro Abs: 4 10*3/uL (ref 1.7–7.7)
Neutrophils Relative %: 59 %
Platelets: 191 10*3/uL (ref 150–400)
RBC: 5.02 MIL/uL (ref 3.87–5.11)
RDW: 14.9 % (ref 11.5–15.5)
WBC: 6.7 10*3/uL (ref 4.0–10.5)
nRBC: 0 % (ref 0.0–0.2)

## 2021-02-09 NOTE — Progress Notes (Signed)
[place]  Telephone:([phone] Fax:(336) [phone]     ID: Tanya [name] DOB: 1955/03/27  MR#: [id number]  EHU#:[id number]  Patient Care Team: Haywood Pao, MD as PCP - General (Internal Medicine) Rolm Bookbinder, MD as Consulting Physician (General Surgery) Kyung Rudd, MD as Consulting Physician (Radiation Oncology) OTHER MD:  CHIEF COMPLAINT: Estrogen receptor positive breast cancer  CURRENT TREATMENT: anastrozole   INTERVAL HISTORY: Sharese returns today for follow-up of her estrogen receptor positive breast cancer.   She continues on anastrozole. She tolerates this moderately well.  She denies increased hot flashes or arthralgias.  Her most recent mammogram was completed on November 26, 2020 and showed no evidence of malignancy in breast density category C.  Mirra continues to have some pain in her left posterior rib cage.  This is slightly worse.  She is concerned about a potential recurrence.  She continues to exercise regularly and is otherwise feeling well and without concerns.  REVIEW OF SYSTEMS: Review of Systems  Constitutional:  Negative for appetite change, chills, fatigue, fever and unexpected weight change.  HENT:   Negative for hearing loss, lump/mass and trouble swallowing.   Eyes:  Negative for eye problems and icterus.  Respiratory:  Negative for chest tightness, cough and shortness of breath.   Cardiovascular:  Negative for chest pain, leg swelling and palpitations.  Gastrointestinal:  Negative for abdominal distention, abdominal pain, constipation, diarrhea, nausea and vomiting.  Endocrine: Negative for hot flashes.  Genitourinary:  Negative for difficulty urinating.   Musculoskeletal:  Negative for arthralgias.  Skin:  Negative for itching and rash.  Neurological:  Negative for dizziness, extremity weakness, headaches and numbness.  Hematological:  Negative for adenopathy. Does not bruise/bleed easily.   Psychiatric/Behavioral:  Negative for depression. The patient is not nervous/anxious.       COVID 19 VACCINATION STATUS: Status post Pfizer x2, with the booster shot scheduled for today 01/28/2020   BREAST CANCER HISTORY: From the original intake note:  Gisell had routine bilateral screening mammography at the [facility] 04/19/2016. This showed a possible area of the symmetry in the left breast and a prominent left axillary lymph nodes. On 04/28/2016 she underwent diagnostic left mammography with tomography and left breast ultrasonography. This showed the breast density to be category B. There was an obscured mass in the upper outer left breast and an enlarged left axillary lymph node. On exam there was focal thickening at the 2:00 position of the left breast 9 cm from the nipple. Targeted ultrasonography confirmed a 1.8 cm hypoechoic mass with a second adjacent 1.1 cm mass both at the 2:00 position of the left breast 9 cm from the nipple. The conglomerate area measured 3.5 cm. There was also an enlarged left axillary lymph node with loss of fatty hilum measuring 1.8 cm.  On family 16 2018 the patient underwent biopsy of the larger left breast mass in question and the suspicious left axillary lymph node. 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  The patient's subsequent history is as detailed below   PAST MEDICAL HISTORY: Past Medical History:  Diagnosis Date   ANXIETY    Arthritis    Dyspnea on exertion    HYPERLIPIDEMIA    HYPERTENSION, BENIGN ESSENTIAL    Malignant neoplasm of upper-outer quadrant of left breast in female,  estrogen receptor positive ([employer]) 05/04/2016   Neuromuscular disorder (HCC)    neuropath   OBESITY    Personal history of chemotherapy     Personal history of radiation therapy    Pre-diabetes    no medications   Sickle cell anemia ([place])    trait    PAST SURGICAL HISTORY: Past Surgical History:  Procedure Laterality Date   BREAST BIOPSY Left 04/29/2016   BREAST BIOPSY Left 05/25/2016   BREAST LUMPECTOMY Left 10/2016   BREAST LUMPECTOMY WITH RADIOACTIVE SEED AND SENTINEL LYMPH NODE BIOPSY Left 11/03/2016   Procedure: INJECT BLUE DYE LEFT BREAST, LEFT BREAST RADIOACTIVE SEED GUIDED LUMPECTOMY WITH LEFT SENTINEL LYMPH NODE BIOPSY AND RADIOACTIVE SEED TARGETED AXILLARY LYMPH NODE EXCISION;  Surgeon: Rolm Bookbinder, MD;  Location: [facility];  Service: General;  Laterality: Left;   CESAREAN SECTION     PORT-A-CATH REMOVAL Right 11/03/2016   Procedure: REMOVAL PORT-A-CATH;  Surgeon: Rolm Bookbinder, MD;  Location: [facility];  Service: General;  Laterality: Right;   PORTACATH PLACEMENT Right 06/02/2016   Procedure: INSERTION PORT-A-CATH WITH ULTRASOUND GUIDANCE;  Surgeon: Rolm Bookbinder, MD;  Location: [facility];  Service: General;  Laterality: Right;   TUBAL LIGATION      FAMILY HISTORY Family History  Problem Relation Age of Onset   Breast cancer Mother 50   Colon cancer Neg Hx    Colon polyps Neg Hx    Esophageal cancer Neg Hx    Rectal cancer Neg Hx    Stomach cancer Neg Hx   The patient's father had a history of prostate cancer. He died at age 70. The patient's mother died at age 36 from unclear causes. She had a history of breast cancer diagnosed in her 30s. The patient had 4 brothers, 2 sisters. There is no other history of breast or ovarian cancer in the family to the patient's knowledge   GYNECOLOGIC HISTORY:  No LMP recorded. Patient is postmenopausal. Menarche age 65, first live birth age 49, the patient is [place] P4. She stopped having periods approximately 2006. She did not use hormone replacement. She used oral contraceptives briefly and remotely, with no complications.   SOCIAL HISTORY:  The  patient is originally from [name]. 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 The patient's husband [name] was also a professor at [employer], where he taught agriculture oh research. Son [name] works in Land, son [name] works for [employer], daughter [name] is a Education officer, museum, and daughter [name] is also a Education officer, museum, all living in [place]. The patient has 3 grandchildren. She attends [employer]    ADVANCED DIRECTIVES: Not in place   HEALTH MAINTENANCE: Social History   Tobacco Use   Smoking status: Never   Smokeless tobacco: Never  Vaping Use   Vaping Use: Never used  Substance Use Topics   Alcohol use: Yes    Comment: rarely   Drug use: No     Colonoscopy:Never  PAP: November 2017  Bone density: Never   Allergies  Allergen Reactions   Ascorbic Acid & Derivatives Other (See Comments)    Mouth sores (Vitamin C) with higher doses   Camoquin [Amodiaquine] Itching    Current Outpatient Medications  Medication Sig Dispense Refill   amLODipine (NORVASC) 5 MG tablet Take 1 tablet by mouth daily.     anastrozole (ARIMIDEX) 1 MG tablet TAKE 1 TABLET BY MOUTH EVERY DAY 90 tablet 4   losartan (COZAAR) 50 MG tablet Take 1 tablet by mouth once  daily 90 tablet 0   Multiple Vitamin (MULTIVITAMIN WITH MINERALS) TABS tablet Take 1 tablet by mouth 3 (three) times a week. One-A-Day Women's 50+     Polyethylene Glycol 3350 (MIRALAX PO) Take by mouth. (Patient not taking: Reported on 12/23/2020)     pravastatin (PRAVACHOL) 40 MG tablet TAKE 1 TABLET BY MOUTH EVERYDAY AT BEDTIME 90 tablet 2   VITAMIN D PO Take by mouth. 1000 IU daily     No current facility-administered medications for this visit.    OBJECTIVE: [place] woman who appears stated age  12:   02/09/21 1337  BP: (!) 143/89  Pulse: 88  Resp: 16  Temp: 97.7 F (36.5 C)  SpO2: 100%    Wt Readings from Last 3 Encounters:  02/09/21 179 lb (81.2 kg)  12/23/20 180 lb (81.6 kg)   08/06/20 175 lb 9.6 oz (79.7 kg)   Body mass index is 30.73 kg/m.    ECOG FS:1 - Symptomatic but completely ambulatory GENERAL: Patient is a well appearing female in no acute distress HEENT:  Sclerae anicteric.  Oropharynx clear and moist. No ulcerations or evidence of oropharyngeal candidiasis. Neck is supple.  NODES:  No cervical, supraclavicular, or axillary lymphadenopathy palpated.  BREAST EXAM:  Left breast s/p lumpectomy and radiation, no sign of local recurrence, right breast benign LUNGS:  Clear to auscultation bilaterally.  No wheezes or rhonchi. HEART:  Regular rate and rhythm. No murmur appreciated. ABDOMEN:  Soft, nontender.  Positive, normoactive bowel sounds. No organomegaly palpated. MSK:  No focal spinal tenderness to palpation.  EXTREMITIES:  No peripheral edema.   SKIN:  Clear with no obvious rashes or skin changes. No nail dyscrasia. NEURO:  Nonfocal. Well oriented.  Appropriate affect.      LAB RESULTS:  CMP     Component Value Date/Time   NA 141 11/20/2018 1336   NA 145 (H) 08/17/2018 1524   NA 141 01/18/2017 1425   K 4.3 11/20/2018 1336   K 3.8 01/18/2017 1425   CL 103 11/20/2018 1336   CO2 27 11/20/2018 1336   CO2 27 01/18/2017 1425   GLUCOSE 106 (H) 11/20/2018 1336   GLUCOSE 107 01/18/2017 1425   BUN 17 11/20/2018 1336   BUN 14 08/17/2018 1524   BUN 12.5 01/18/2017 1425   CREATININE 1.14 (H) 11/20/2018 1336   CREATININE 0.8 01/18/2017 1425   CALCIUM 9.5 11/20/2018 1336   CALCIUM 9.0 01/18/2017 1425   PROT 7.3 11/20/2018 1336   PROT 7.1 08/17/2018 1524   PROT 6.7 01/18/2017 1425   ALBUMIN 4.2 11/20/2018 1336   ALBUMIN 4.5 08/17/2018 1524   ALBUMIN 3.7 01/18/2017 1425   AST 16 11/20/2018 1336   AST 19 01/18/2017 1425   ALT 16 11/20/2018 1336   ALT 18 01/18/2017 1425   ALKPHOS 82 11/20/2018 1336   ALKPHOS 73 01/18/2017 1425   BILITOT 0.4 11/20/2018 1336   BILITOT 0.4 08/17/2018 1524   BILITOT 0.30 01/18/2017 1425   GFRNONAA 51 (L)  11/20/2018 1336   GFRAA 60 (L) 11/20/2018 1336    INo results found for: SPEP, UPEP  CBC    Component Value Date/Time   WBC 6.7 02/09/2021 1324   RBC 5.02 02/09/2021 1324   HGB 13.1 02/09/2021 1324   HGB 14.0 08/17/2018 1524   HGB 12.5 01/18/2017 1425   HCT 40.9 02/09/2021 1324   HCT 41.0 08/17/2018 1524   HCT 39.5 01/18/2017 1425   PLT 191 02/09/2021 1324   PLT 181 08/17/2018 1524  MCV 81.5 02/09/2021 1324   MCV 78 (L) 08/17/2018 1524   MCV 77.1 (L) 01/18/2017 1425   MCH 26.1 02/09/2021 1324   MCHC 32.0 02/09/2021 1324   RDW 14.9 02/09/2021 1324   RDW 14.9 08/17/2018 1524   RDW 18.1 (H) 01/18/2017 1425   LYMPHSABS 2.1 02/09/2021 1324   LYMPHSABS 0.9 01/18/2017 1425   MONOABS 0.4 02/09/2021 1324   MONOABS 0.5 01/18/2017 1425   EOSABS 0.2 02/09/2021 1324   EOSABS 0.1 01/18/2017 1425   BASOSABS 0.1 02/09/2021 1324   BASOSABS 0.0 01/18/2017 1425        Chemistry      Component Value Date/Time   NA 141 11/20/2018 1336   NA 145 (H) 08/17/2018 1524   NA 141 01/18/2017 1425   K 4.3 11/20/2018 1336   K 3.8 01/18/2017 1425   CL 103 11/20/2018 1336   CO2 27 11/20/2018 1336   CO2 27 01/18/2017 1425   BUN 17 11/20/2018 1336   BUN 14 08/17/2018 1524   BUN 12.5 01/18/2017 1425   CREATININE 1.14 (H) 11/20/2018 1336   CREATININE 0.8 01/18/2017 1425      Component Value Date/Time   CALCIUM 9.5 11/20/2018 1336   CALCIUM 9.0 01/18/2017 1425   ALKPHOS 82 11/20/2018 1336   ALKPHOS 73 01/18/2017 1425   AST 16 11/20/2018 1336   AST 19 01/18/2017 1425   ALT 16 11/20/2018 1336   ALT 18 01/18/2017 1425   BILITOT 0.4 11/20/2018 1336   BILITOT 0.4 08/17/2018 1524   BILITOT 0.30 01/18/2017 1425       No results found for: LABCA2  No components found for: LABCA125  No results for input(s): INR in the last 168 hours.  Urinalysis    Component Value Date/Time   COLORURINE YELLOW 08/06/2020 1401   APPEARANCEUR CLEAR 08/06/2020 1401   LABSPEC 1.014 08/06/2020 1401    LABSPEC 1.010 08/18/2016 1217   PHURINE 5.0 08/06/2020 1401   GLUCOSEU NEGATIVE 08/06/2020 1401   GLUCOSEU Negative 08/18/2016 1217   HGBUR NEGATIVE 08/06/2020 1401   HGBUR negative 06/01/2006 1116   BILIRUBINUR NEGATIVE 08/06/2020 1401   BILIRUBINUR Negative 08/18/2016 1217   KETONESUR NEGATIVE 08/06/2020 1401   PROTEINUR NEGATIVE 08/06/2020 1401   UROBILINOGEN 0.2 08/18/2016 1217   NITRITE NEGATIVE 08/06/2020 1401   LEUKOCYTESUR NEGATIVE 08/06/2020 1401   LEUKOCYTESUR Negative 08/18/2016 1217    STUDIES: No results found.   ELIGIBLE FOR AVAILABLE RESEARCH PROTOCOL: Not a candidate for Prevent (already on a statin drug)  ASSESSMENT: 65 y.o. [place] woman status post left breast upper inner quadrant and left axillary lymph node biopsy February 2018, both showing invasive ductal carcinoma, grade 3, clinically T1c N1a, or stage IIB, estrogen receptor positive, progesterone receptor and HER-2 negative, with an MIB-1 of 40%  (a) MRI biopsy 05/25/2016 of a second area of concern in the left breast also shows invasive ductal carcinoma  (1) neoadjuvant anastrozole started 05/11/2016, held at the start of chemotherapy  (2) Mammaprint results shows the tumor to be luminal B, high risk, indicating a need for chemotherapy.  (3) chemotherapy consisting of doxorubicin and cyclophosphamide in dose dense fashion 4 starting 06/09/2016, completed 07/21/2016, followed by weekly paclitaxel started 08/11/2016  (a) paclitaxel stopped after cycle 8 because of neuropathy; last dose 09/29/2016  (4) left lumpectomy and sentinel lymph node sampling 11/03/2016 showed no residual tumor in the breast. One of 2 sentinel lymph nodes was involved by tumor. Margins were clear. pT0 pN1  (5) adjuvant radiation completed  02/01/2017 1. Left breast and supraclavicular 50.4 Gy in 28 fractions 2. Boost to the seroma 10 Gy in 5 fractions Total of 60.4 Gy  (6)  resumed anastrozole 02/11/2017   PLAN: Jelicia is  here today for follow-up of her estrogen positive breast cancer.  We are going to get a CT chest because of her pain.  This is likely musculoskeletal, but consent considering the worsening of the pain and slight change in the area where she is experiencing it we will get the CT chest to further evaluate.  She will continue on anastrozole daily as she is tolerating this well.  Her mammogram is due to be repeated in September 2023.  Her bone density at that time was normal.  She will need a recheck in 2 years because she is taking the anastrozole.  We discussed her health maintenance and she will continue to see her primary care provider regularly.  She was also recommended healthy diet and exercise.  She does not want to schedule an appointment for follow-up at this point.  She wants to look into the options for medical oncologists and call me with her decision.   Total encounter time 30 minutes.* in face to face visit time, order entry, lab review, imaging review, and documentation.  Wilber Bihari, NP 02/09/21 6:27 PM Medical Oncology and Hematology [facility] [place], [place] [address] Tel. [phone]    Fax. [phone]    *Total Encounter Time as defined by the Centers for Medicare and Medicaid Services includes, in addition to the face-to-face time of a patient visit (documented in the note above) non-face-to-face time: obtaining and reviewing outside history, ordering and reviewing medications, tests or procedures, care coordination (communications with other health care professionals or caregivers) and documentation in the medical record.

## 2021-02-10 NOTE — Telephone Encounter (Signed)
SECOND ATTEMPT:  Called pt to inquire further about her concern. LVM requesting returned call.

## 2021-02-11 NOTE — Telephone Encounter (Signed)
FINAL ATTEMPT:  Called pt to inquire further about her concern(s). Pt expressed confusion about her colonoscopy prep instructions. Asked that she gather her instructions so we could review them together, and for her to repeat back to ensure full understanding of instructions. Pt read each line of her instructions. Provided pt ample time for pt to ask questions, for me to respond to each question and for her to provide me with HER comprehension of her instructions. After spending 21 minutes on the phone with pt reviewing her instructions, pt expressed full understanding of her instructions by stating, "I have no further questions".

## 2021-02-16 ENCOUNTER — Other Ambulatory Visit: Payer: Self-pay

## 2021-02-16 ENCOUNTER — Ambulatory Visit (AMBULATORY_SURGERY_CENTER): Payer: 59 | Admitting: Internal Medicine

## 2021-02-16 ENCOUNTER — Encounter: Payer: Self-pay | Admitting: Internal Medicine

## 2021-02-16 VITALS — BP 134/74 | HR 86 | Temp 96.8°F | Resp 16 | Ht 64.0 in | Wt 180.0 lb

## 2021-02-16 DIAGNOSIS — Z1211 Encounter for screening for malignant neoplasm of colon: Secondary | ICD-10-CM

## 2021-02-16 DIAGNOSIS — K649 Unspecified hemorrhoids: Secondary | ICD-10-CM

## 2021-02-16 DIAGNOSIS — K5730 Diverticulosis of large intestine without perforation or abscess without bleeding: Secondary | ICD-10-CM

## 2021-02-16 MED ORDER — SODIUM CHLORIDE 0.9 % IV SOLN: 500.0000 mL | Freq: Once | INTRAVENOUS | Status: DC

## 2021-02-16 NOTE — Patient Instructions (Signed)

## 2021-02-16 NOTE — Progress Notes (Signed)
A/ox3, pleased with MAC, report to RN 

## 2021-02-16 NOTE — Progress Notes (Signed)
Pt's states no medical or surgical changes since previsit or office visit.   VS taken by [facility]

## 2021-02-16 NOTE — Progress Notes (Signed)
GASTROENTEROLOGY PROCEDURE H&P NOTE   Primary Care Physician: Tisovec, Fransico Him, MD    Reason for Procedure:   Colon cancer screening  Plan:    Colonoscopy  Patient is appropriate for endoscopic procedure(s) in the ambulatory ([place]) setting.  The nature of the procedure, as well as the risks, benefits, and alternatives were carefully and thoroughly reviewed with the patient. Ample time for discussion and questions allowed. The patient understood, was satisfied, and agreed to proceed.     HPI: Tanya [name] is a 65 y.o. female who presents for colonoscopy for colon cancer screening. This is her first colonoscopy. Denies fam hx of colon cancer. Denies blood thinners.   Past Medical History:  Diagnosis Date   ANXIETY    Arthritis    Dyspnea on exertion    HYPERLIPIDEMIA    HYPERTENSION, BENIGN ESSENTIAL    Malignant neoplasm of upper-outer quadrant of left breast in female, estrogen receptor positive ([place]) 05/04/2016   Neuromuscular disorder (HCC)    neuropath   OBESITY    Personal history of chemotherapy    Personal history of radiation therapy    Pre-diabetes    no medications   Sickle cell anemia ([place])    trait    Past Surgical History:  Procedure Laterality Date   BREAST BIOPSY Left 04/29/2016   BREAST BIOPSY Left 05/25/2016   BREAST LUMPECTOMY Left 10/2016   BREAST LUMPECTOMY WITH RADIOACTIVE SEED AND SENTINEL LYMPH NODE BIOPSY Left 11/03/2016   Procedure: INJECT BLUE DYE LEFT BREAST, LEFT BREAST RADIOACTIVE SEED GUIDED LUMPECTOMY WITH LEFT SENTINEL LYMPH NODE BIOPSY AND RADIOACTIVE SEED TARGETED AXILLARY LYMPH NODE EXCISION;  Surgeon: Rolm Bookbinder, MD;  Location: [facility];  Service: General;  Laterality: Left;   CESAREAN SECTION     PORT-A-CATH REMOVAL Right 11/03/2016   Procedure: REMOVAL PORT-A-CATH;  Surgeon: Rolm Bookbinder, MD;  Location: [facility];  Service: General;  Laterality: Right;   PORTACATH PLACEMENT Right 06/02/2016   Procedure: INSERTION  PORT-A-CATH WITH ULTRASOUND GUIDANCE;  Surgeon: Rolm Bookbinder, MD;  Location: [facility];  Service: General;  Laterality: Right;   TUBAL LIGATION      Prior to Admission medications   Medication Sig Start Date End Date Taking? Authorizing Provider  amLODipine (NORVASC) 5 MG tablet Take 1 tablet by mouth daily. 07/07/20  Yes [provider]  anastrozole (ARIMIDEX) 1 MG tablet TAKE 1 TABLET BY MOUTH EVERY DAY 12/14/20  Yes Magrinat, Virgie Dad, MD  losartan (COZAAR) 50 MG tablet Take 1 tablet by mouth once daily 08/05/19  Yes Wilber Oliphant, MD  Multiple Vitamin (MULTIVITAMIN WITH MINERALS) TABS tablet Take 1 tablet by mouth 3 (three) times a week. One-A-Day Women's 50+   Yes [provider]  pravastatin (PRAVACHOL) 40 MG tablet TAKE 1 TABLET BY MOUTH EVERYDAY AT BEDTIME 01/21/19  Yes Wilber Oliphant, MD  VITAMIN D PO Take by mouth. 1000 IU daily   Yes [provider]  Polyethylene Glycol 3350 (MIRALAX PO) Take by mouth. Patient not taking: Reported on 12/23/2020    [provider]    Current Outpatient Medications  Medication Sig Dispense Refill   amLODipine (NORVASC) 5 MG tablet Take 1 tablet by mouth daily.     anastrozole (ARIMIDEX) 1 MG tablet TAKE 1 TABLET BY MOUTH EVERY DAY 90 tablet 4   losartan (COZAAR) 50 MG tablet Take 1 tablet by mouth once daily 90 tablet 0   Multiple Vitamin (MULTIVITAMIN WITH MINERALS) TABS tablet Take 1 tablet by  mouth 3 (three) times a week. One-A-Day Women's 50+     pravastatin (PRAVACHOL) 40 MG tablet TAKE 1 TABLET BY MOUTH EVERYDAY AT BEDTIME 90 tablet 2   VITAMIN D PO Take by mouth. 1000 IU daily     Polyethylene Glycol 3350 (MIRALAX PO) Take by mouth. (Patient not taking: Reported on 12/23/2020)     Current Facility-Administered Medications  Medication Dose Route Frequency Provider Last Rate Last Admin   0.9 %  sodium chloride infusion  500 mL Intravenous Once Sharyn Creamer, MD        Allergies as of  02/16/2021 - Review Complete 02/16/2021  Allergen Reaction Noted   Ascorbic acid & derivatives Other (See Comments) 10/21/2016   Camoquin [amodiaquine] Itching 05/11/2016    Family History  Problem Relation Age of Onset   Breast cancer Mother 19   Colon cancer Neg Hx    Colon polyps Neg Hx    Esophageal cancer Neg Hx    Rectal cancer Neg Hx    Stomach cancer Neg Hx     Social History   Socioeconomic History   Marital status: Married    Spouse name: Not on file   Number of children: Not on file   Years of education: 18   Highest education level: Master's degree (e.g., MA, MS, MEng, MEd, MSW, MBA)  Occupational History   Occupation: works at [employer]  Tobacco Use   Smoking status: Never   Smokeless tobacco: Never  Vaping Use   Vaping Use: Never used  Substance and Sexual Activity   Alcohol use: Yes    Comment: rarely   Drug use: No   Sexual activity: Not on file  Other Topics Concern   Not on file  Social History Narrative   Lives with husband in a split level home.  Works at [employer].  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    Education: Masters.     Social Determinants of Health   Financial Resource Strain: Not on file  Food Insecurity: Not on file  Transportation Needs: Not on file  Physical Activity: Not on file  Stress: Not on file  Social Connections: Not on file  Intimate Partner Violence: Not on file    Physical Exam: Vital signs in last 24 hours: BP (!) 158/108   Pulse (!) 102   Temp (!) 96.8 F (36 C)   Ht 5\' 4"  (1.626 m)   Wt 180 lb (81.6 kg)   SpO2 100%   BMI 30.90 kg/m  GEN: NAD EYE: Sclerae anicteric ENT: MMM CV: Non-tachycardic Pulm: No increased work of breathing GI: Soft, NT/ND NEURO:  Alert & Oriented   [name], MD [facility]  02/16/2021 8:41 AM

## 2021-02-16 NOTE — Op Note (Signed)
[place] Patient Name: Tanya [name] Procedure Date: 02/16/2021 8:48 AM MRN: [id number] Endoscopist: Sonny Masters "Tanya [name] ,  Age: 65 Referring MD:  Date of Birth: 1955-10-20 Gender: Female Account #: [id number] Procedure:                Colonoscopy Indications:              Screening for colorectal malignant neoplasm, This                            is the patient's first colonoscopy Medicines:                Monitored Anesthesia Care Procedure:                Pre-Anesthesia Assessment:                           - Prior to the procedure, a History and Physical                            was performed, and patient medications and                            allergies were reviewed. The patient's tolerance of                            previous anesthesia was also reviewed. The risks                            and benefits of the procedure and the sedation                            options and risks were discussed with the patient.                            All questions were answered, and informed consent                            was obtained. Prior Anticoagulants: The patient has                            taken no previous anticoagulant or antiplatelet                            agents. ASA Grade Assessment: II - A patient with                            mild systemic disease. After reviewing the risks                            and benefits, the patient was deemed in                            satisfactory condition to undergo the procedure.  After obtaining informed consent, the colonoscope                            was passed under direct vision. Throughout the                            procedure, the patient's blood pressure, pulse, and                            oxygen saturations were monitored continuously. The                            CF [id number] #[id number] was introduced through the anus                            and advanced to  the the terminal ileum. The                            colonoscopy was performed without difficulty. The                            patient tolerated the procedure well. The quality                            of the bowel preparation was good. The terminal                            ileum, ileocecal valve, appendiceal orifice, and                            rectum were photographed. Scope In: 8:55:02 AM Scope Out: 9:11:24 AM Scope Withdrawal Time: 0 hours 8 minutes 45 seconds  Total Procedure Duration: 0 hours 16 minutes 22 seconds  Findings:                 The terminal ileum appeared normal.                           A few small-mouthed diverticula were found in the                            entire colon.                           Non-bleeding internal hemorrhoids were found during                            retroflexion. Complications:            No immediate complications. Estimated Blood Loss:     Estimated blood loss was minimal. Impression:               - The examined portion of the ileum was normal.                           - Diverticulosis in the entire examined colon.                           -  Non-bleeding internal hemorrhoids.                           - No specimens collected. Recommendation:           - Discharge patient to home (with escort).                           - Repeat colonoscopy in 10 years for screening                            purposes.                           - The findings and recommendations were discussed                            with the patient. Sonny Masters "Tanya [name],  02/16/2021 9:16:42 AM

## 2021-02-18 ENCOUNTER — Telehealth: Payer: Self-pay

## 2021-02-18 NOTE — Telephone Encounter (Signed)
  Follow up Call-  Call back number 02/16/2021  Post procedure Call Back phone  # [phone]  Permission to leave phone message Yes  Some recent data might be hidden     Patient questions:  Do you have a fever, pain , or abdominal swelling? No. Pain Score  0 *  Have you tolerated food without any problems? Yes.    Have you been able to return to your normal activities? Yes.    Do you have any questions about your discharge instructions: Diet   No. Medications  No. Follow up visit  No.  Do you have questions or concerns about your Care? No.  Actions: * If pain score is 4 or above: No action needed, pain <4.

## 2021-02-19 ENCOUNTER — Telehealth: Payer: Self-pay | Admitting: Internal Medicine

## 2021-02-19 ENCOUNTER — Telehealth: Payer: Self-pay

## 2021-02-19 NOTE — Telephone Encounter (Signed)
error 

## 2021-02-19 NOTE — Telephone Encounter (Signed)
Patient called and stated that she has not had a bowel movement since her procedure and is seeking advice.Please advise

## 2021-02-19 NOTE — Telephone Encounter (Signed)
Called pt to further inquire about her symptoms. States she has not had a BM since 02/16/21. Denies abd pain, N/V but does have abd distension. States she has been passing gas, eating a normal diet and drink more than 64 ounces of water daily and still has not had results. Advised it is not uncommon for her bowels to be slow to return to normal habits. However, since she has not passed any stool in 3 days, advised she will likely need to complete a bowel purge as below. Also sent bowel purge instructions via My Chart:  BOWEL PURGE:  I am recommending a bowel purge to aid in cleaning out your bowels.   OVER THE COUNTER SHOPPING GUIDE:  Purchase 1 (one) 119 GRAM bottle of Miralax Purchase 1 (one) box of 5 mg Dulcolax tablets Purchase 1 (one) 32 ounce bottle of Gatorade  STEPS:  In the morning, mix the entire bottle of Miralax in the 32 ounces of Gatorade, then refrigerate to dissolve completely. Take 4 dulcolax tablets, then wait 1 hour After 1 hour, drink the Miralax solution you have prepared over the next 2-3 hours. You should expect results within 1 to 6 hours after completing this bowel purge.  BOWEL PURGE:  This will aid in cleaning out your bowels

## 2021-02-24 ENCOUNTER — Ambulatory Visit (HOSPITAL_COMMUNITY)
Admission: RE | Admit: 2021-02-24 | Discharge: 2021-02-24 | Disposition: A | Discharge status: Home / Self Care | Payer: 59 | Source: Ambulatory Visit | Attending: Adult Health | Admitting: Adult Health

## 2021-02-24 ENCOUNTER — Other Ambulatory Visit: Payer: Self-pay

## 2021-02-24 DIAGNOSIS — C50412 Malignant neoplasm of upper-outer quadrant of left female breast: Secondary | ICD-10-CM | POA: Insufficient documentation

## 2021-02-24 DIAGNOSIS — Z170 Estrogen receptor positive status [ER+]: Secondary | ICD-10-CM | POA: Insufficient documentation

## 2021-02-24 LAB — POCT I-STAT CREATININE: Creatinine, Ser: 1 mg/dL (ref 0.44–1.00)

## 2021-02-24 MED ORDER — SODIUM CHLORIDE (PF) 0.9 % IJ SOLN
INTRAMUSCULAR | Status: AC
  Filled 2021-02-24: qty 50

## 2021-02-24 MED ORDER — IOHEXOL 350 MG/ML SOLN
60.0000 mL | Freq: Once | INTRAVENOUS | Status: AC | PRN
  Administered 2021-02-24: 17:00:00 60 mL via INTRAVENOUS

## 2021-02-26 ENCOUNTER — Telehealth: Payer: Self-pay

## 2021-02-26 NOTE — Telephone Encounter (Signed)
Called and spoke with pt, per Mendel Ryder, CT scan looks normal.  Pt verbalized understanding and thanks

## 2021-07-13 DIAGNOSIS — E119 Type 2 diabetes mellitus without complications: Secondary | ICD-10-CM | POA: Diagnosis not present

## 2021-07-13 DIAGNOSIS — I10 Essential (primary) hypertension: Secondary | ICD-10-CM | POA: Diagnosis not present

## 2021-07-13 DIAGNOSIS — E7800 Pure hypercholesterolemia, unspecified: Secondary | ICD-10-CM | POA: Diagnosis not present

## 2021-07-20 DIAGNOSIS — G4700 Insomnia, unspecified: Secondary | ICD-10-CM | POA: Diagnosis not present

## 2021-07-20 DIAGNOSIS — R82998 Other abnormal findings in urine: Secondary | ICD-10-CM | POA: Diagnosis not present

## 2021-07-20 DIAGNOSIS — G4733 Obstructive sleep apnea (adult) (pediatric): Secondary | ICD-10-CM | POA: Diagnosis not present

## 2021-07-20 DIAGNOSIS — E7800 Pure hypercholesterolemia, unspecified: Secondary | ICD-10-CM | POA: Diagnosis not present

## 2021-07-20 DIAGNOSIS — M1990 Unspecified osteoarthritis, unspecified site: Secondary | ICD-10-CM | POA: Diagnosis not present

## 2021-07-20 DIAGNOSIS — Z0000 Encounter for general adult medical examination without abnormal findings: Secondary | ICD-10-CM | POA: Diagnosis not present

## 2021-07-20 DIAGNOSIS — I10 Essential (primary) hypertension: Secondary | ICD-10-CM | POA: Diagnosis not present

## 2021-07-20 DIAGNOSIS — Z853 Personal history of malignant neoplasm of breast: Secondary | ICD-10-CM | POA: Diagnosis not present

## 2021-07-20 DIAGNOSIS — F419 Anxiety disorder, unspecified: Secondary | ICD-10-CM | POA: Diagnosis not present

## 2021-07-20 DIAGNOSIS — E119 Type 2 diabetes mellitus without complications: Secondary | ICD-10-CM | POA: Diagnosis not present

## 2021-11-12 ENCOUNTER — Other Ambulatory Visit: Payer: Self-pay

## 2021-11-12 DIAGNOSIS — Z170 Estrogen receptor positive status [ER+]: Secondary | ICD-10-CM

## 2021-11-12 NOTE — Progress Notes (Signed)
Patient Care Team: Tisovec, Fransico Him, MD as PCP - General (Internal Medicine) Rolm Bookbinder, MD as Consulting Physician (General Surgery) Kyung Rudd, MD as Consulting Physician (Radiation Oncology)  DIAGNOSIS: No diagnosis found.  SUMMARY OF ONCOLOGIC HISTORY: Oncology History   No history exists.    CHIEF COMPLIANT:   INTERVAL HISTORY: Tanya [name] is a   ALLERGIES:  is allergic to ascorbic acid & derivatives and camoquin [amodiaquine].  MEDICATIONS:  Current Outpatient Medications  Medication Sig Dispense Refill   amLODipine (NORVASC) 5 MG tablet Take 1 tablet by mouth daily.     anastrozole (ARIMIDEX) 1 MG tablet TAKE 1 TABLET BY MOUTH EVERY DAY 90 tablet 4   losartan (COZAAR) 50 MG tablet Take 1 tablet by mouth once daily 90 tablet 0   Multiple Vitamin (MULTIVITAMIN WITH MINERALS) TABS tablet Take 1 tablet by mouth 3 (three) times a week. One-A-Day Women's 50+     Polyethylene Glycol 3350 (MIRALAX PO) Take by mouth. (Patient not taking: Reported on 12/23/2020)     pravastatin (PRAVACHOL) 40 MG tablet TAKE 1 TABLET BY MOUTH EVERYDAY AT BEDTIME 90 tablet 2   VITAMIN D PO Take by mouth. 1000 IU daily     No current facility-administered medications for this visit.    PHYSICAL EXAMINATION: ECOG PERFORMANCE STATUS: {CHL ONC ECOG [phone]}  There were no vitals filed for this visit. There were no vitals filed for this visit.  BREAST:*** No palpable masses or nodules in either right or left breasts. No palpable axillary supraclavicular or infraclavicular adenopathy no breast tenderness or nipple discharge. (exam performed in the presence of a chaperone)  LABORATORY DATA:  I have reviewed the data as listed    Latest Ref Rng & Units 02/24/2021    5:13 PM 11/20/2018    1:36 PM 08/17/2018    3:24 PM  CMP  Glucose 70 - 99 mg/dL  106  126   BUN 8 - 23 mg/dL  17  14   Creatinine 0.44 - 1.00 mg/dL 1.00  1.14  0.84   Sodium 135 - 145 mmol/L  141  145    Potassium 3.5 - 5.1 mmol/L  4.3  4.1   Chloride 98 - 111 mmol/L  103  106   CO2 22 - 32 mmol/L  27  21   Calcium 8.9 - 10.3 mg/dL  9.5  9.8   Total Protein 6.5 - 8.1 g/dL  7.3  7.1   Total Bilirubin 0.3 - 1.2 mg/dL  0.4  0.4   Alkaline Phos 38 - 126 U/L  82  82   AST 15 - 41 U/L  16  14   ALT 0 - 44 U/L  16  15     Lab Results  Component Value Date   WBC 6.7 02/09/2021   HGB 13.1 02/09/2021   HCT 40.9 02/09/2021   MCV 81.5 02/09/2021   PLT 191 02/09/2021   NEUTROABS 4.0 02/09/2021    ASSESSMENT & PLAN:  No problem-specific Assessment & Plan notes found for this encounter.    No orders of the defined types were placed in this encounter.  The patient has a good understanding of the overall plan. she agrees with it. she will call with any problems that may develop before the next visit here. Total time spent: 30 mins including face to face time and time spent for planning, charting and co-ordination of care   Suzzette Righter, [place] 11/12/21    I Daquawn Seelman, Bucky Grigg am  scribing for Dr. Lindi Adie  ***.

## 2021-11-16 ENCOUNTER — Inpatient Hospital Stay: Payer: Medicare HMO

## 2021-11-16 ENCOUNTER — Other Ambulatory Visit: Payer: Self-pay

## 2021-11-16 ENCOUNTER — Inpatient Hospital Stay: Payer: Medicare HMO | Attending: Hematology and Oncology | Admitting: Hematology and Oncology

## 2021-11-16 DIAGNOSIS — Z170 Estrogen receptor positive status [ER+]: Secondary | ICD-10-CM

## 2021-11-16 DIAGNOSIS — Z79899 Other long term (current) drug therapy: Secondary | ICD-10-CM | POA: Insufficient documentation

## 2021-11-16 DIAGNOSIS — C50412 Malignant neoplasm of upper-outer quadrant of left female breast: Secondary | ICD-10-CM | POA: Diagnosis not present

## 2021-11-16 DIAGNOSIS — K5900 Constipation, unspecified: Secondary | ICD-10-CM | POA: Diagnosis not present

## 2021-11-16 DIAGNOSIS — Z79811 Long term (current) use of aromatase inhibitors: Secondary | ICD-10-CM | POA: Diagnosis not present

## 2021-11-16 DIAGNOSIS — D573 Sickle-cell trait: Secondary | ICD-10-CM | POA: Diagnosis not present

## 2021-11-16 LAB — CMP (CANCER CENTER ONLY)
ALT: 19 U/L (ref 0–44)
AST: 19 U/L (ref 15–41)
Albumin: 4.5 g/dL (ref 3.5–5.0)
Alkaline Phosphatase: 64 U/L (ref 38–126)
Anion gap: 5 (ref 5–15)
BUN: 17 mg/dL (ref 8–23)
CO2: 30 mmol/L (ref 22–32)
Calcium: 9.4 mg/dL (ref 8.9–10.3)
Chloride: 105 mmol/L (ref 98–111)
Creatinine: 0.77 mg/dL (ref 0.44–1.00)
GFR, Estimated: >60 mL/min (ref >60)
Glucose, Bld: 118 mg/dL — ABNORMAL HIGH (ref 70–99)
Potassium: 3.6 mmol/L (ref 3.5–5.1)
Sodium: 140 mmol/L (ref 135–145)
Total Bilirubin: 0.5 mg/dL (ref 0.3–1.2)
Total Protein: 7.3 g/dL (ref 6.5–8.1)

## 2021-11-16 LAB — CBC WITH DIFFERENTIAL (CANCER CENTER ONLY)
Abs Immature Granulocytes: 0.01 10*3/uL (ref 0.00–0.07)
Basophils Absolute: 0 10*3/uL (ref 0.0–0.1)
Basophils Relative: 1 %
Eosinophils Absolute: 0.1 10*3/uL (ref 0.0–0.5)
Eosinophils Relative: 2 %
HCT: 40.9 % (ref 36.0–46.0)
Hemoglobin: 13.5 g/dL (ref 12.0–15.0)
Immature Granulocytes: 0 %
Lymphocytes Relative: 43 %
Lymphs Abs: 2.4 10*3/uL (ref 0.7–4.0)
MCH: 26.2 pg (ref 26.0–34.0)
MCHC: 33 g/dL (ref 30.0–36.0)
MCV: 79.4 fL — ABNORMAL LOW (ref 80.0–100.0)
Monocytes Absolute: 0.6 10*3/uL (ref 0.1–1.0)
Monocytes Relative: 11 %
Neutro Abs: 2.4 10*3/uL (ref 1.7–7.7)
Neutrophils Relative %: 43 %
Platelet Count: 151 10*3/uL (ref 150–400)
RBC: 5.15 MIL/uL — ABNORMAL HIGH (ref 3.87–5.11)
RDW: 14.7 % (ref 11.5–15.5)
WBC Count: 5.5 10*3/uL (ref 4.0–10.5)
nRBC: 0 % (ref 0.0–0.2)

## 2021-11-16 MED ORDER — ANASTROZOLE 1 MG PO TABS: 1.0000 mg | ORAL_TABLET | Freq: Every day | ORAL | 4 refills | Status: DC

## 2021-11-16 NOTE — Assessment & Plan Note (Signed)
February 2018: Left breast UIQ and left axillary lymph node biopsy: Grade 3 IDC T1CN1A stage IIb ER positive PR negative HER2 negative Ki-67 40%, MRI biopsy 05/25/2016: Second area of left breast cancer 05/11/2016: Started neoadjuvant anastrozole MammaPrint: High risk luminal type B 06/09/2016-09/29/2016: Neoadjuvant dose dense Adriamycin and Cytoxan x4 followed by Taxol x8 (stopped for neuropathy) 11/03/2016: Left lumpectomy: Breast showed complete response, 1/2 sentinel nodes positive 02/01/2017: Completed adjuvant radiation  Current treatment: Anastrozole resumed 02/11/2017  Breast cancer surveillance: 1.  Breast exam 11/16/2021: Benign 2. mammogram: 11/26/2020: Benign 3.  CT chest 02/25/2021: Negative for metastatic disease  Return to clinic in 1 year for follow-up

## 2021-11-24 ENCOUNTER — Other Ambulatory Visit: Payer: Self-pay | Admitting: Internal Medicine

## 2021-11-24 DIAGNOSIS — Z1231 Encounter for screening mammogram for malignant neoplasm of breast: Secondary | ICD-10-CM

## 2021-12-28 ENCOUNTER — Ambulatory Visit: Payer: Medicare HMO

## 2022-02-10 ENCOUNTER — Ambulatory Visit: Payer: Medicare HMO

## 2022-03-29 ENCOUNTER — Ambulatory Visit
Admission: RE | Admit: 2022-03-29 | Discharge: 2022-03-29 | Disposition: A | Discharge status: Home / Self Care | Payer: Medicare HMO | Source: Ambulatory Visit | Attending: Internal Medicine | Admitting: Internal Medicine

## 2022-03-29 DIAGNOSIS — Z1231 Encounter for screening mammogram for malignant neoplasm of breast: Secondary | ICD-10-CM | POA: Diagnosis not present

## 2022-04-05 ENCOUNTER — Ambulatory Visit: Payer: Medicare HMO

## 2022-04-12 DIAGNOSIS — M1990 Unspecified osteoarthritis, unspecified site: Secondary | ICD-10-CM | POA: Diagnosis not present

## 2022-04-12 DIAGNOSIS — E7800 Pure hypercholesterolemia, unspecified: Secondary | ICD-10-CM | POA: Diagnosis not present

## 2022-04-12 DIAGNOSIS — Z853 Personal history of malignant neoplasm of breast: Secondary | ICD-10-CM | POA: Diagnosis not present

## 2022-04-12 DIAGNOSIS — E669 Obesity, unspecified: Secondary | ICD-10-CM | POA: Diagnosis not present

## 2022-04-12 DIAGNOSIS — I10 Essential (primary) hypertension: Secondary | ICD-10-CM | POA: Diagnosis not present

## 2022-04-12 DIAGNOSIS — E119 Type 2 diabetes mellitus without complications: Secondary | ICD-10-CM | POA: Diagnosis not present

## 2022-04-12 DIAGNOSIS — F419 Anxiety disorder, unspecified: Secondary | ICD-10-CM | POA: Diagnosis not present

## 2022-04-12 DIAGNOSIS — R6889 Other general symptoms and signs: Secondary | ICD-10-CM | POA: Diagnosis not present

## 2022-04-12 DIAGNOSIS — G4733 Obstructive sleep apnea (adult) (pediatric): Secondary | ICD-10-CM | POA: Diagnosis not present

## 2022-04-12 DIAGNOSIS — G4700 Insomnia, unspecified: Secondary | ICD-10-CM | POA: Diagnosis not present

## 2022-07-28 DIAGNOSIS — I10 Essential (primary) hypertension: Secondary | ICD-10-CM | POA: Diagnosis not present

## 2022-07-28 DIAGNOSIS — Z1212 Encounter for screening for malignant neoplasm of rectum: Secondary | ICD-10-CM | POA: Diagnosis not present

## 2022-07-28 DIAGNOSIS — F419 Anxiety disorder, unspecified: Secondary | ICD-10-CM | POA: Diagnosis not present

## 2022-07-28 DIAGNOSIS — E785 Hyperlipidemia, unspecified: Secondary | ICD-10-CM | POA: Diagnosis not present

## 2022-07-28 DIAGNOSIS — E7800 Pure hypercholesterolemia, unspecified: Secondary | ICD-10-CM | POA: Diagnosis not present

## 2022-07-28 DIAGNOSIS — E119 Type 2 diabetes mellitus without complications: Secondary | ICD-10-CM | POA: Diagnosis not present

## 2022-07-28 DIAGNOSIS — R6889 Other general symptoms and signs: Secondary | ICD-10-CM | POA: Diagnosis not present

## 2022-08-04 DIAGNOSIS — E669 Obesity, unspecified: Secondary | ICD-10-CM | POA: Diagnosis not present

## 2022-08-04 DIAGNOSIS — M1990 Unspecified osteoarthritis, unspecified site: Secondary | ICD-10-CM | POA: Diagnosis not present

## 2022-08-04 DIAGNOSIS — E119 Type 2 diabetes mellitus without complications: Secondary | ICD-10-CM | POA: Diagnosis not present

## 2022-08-04 DIAGNOSIS — Z0000 Encounter for general adult medical examination without abnormal findings: Secondary | ICD-10-CM | POA: Diagnosis not present

## 2022-08-04 DIAGNOSIS — I10 Essential (primary) hypertension: Secondary | ICD-10-CM | POA: Diagnosis not present

## 2022-08-04 DIAGNOSIS — F419 Anxiety disorder, unspecified: Secondary | ICD-10-CM | POA: Diagnosis not present

## 2022-08-04 DIAGNOSIS — R6889 Other general symptoms and signs: Secondary | ICD-10-CM | POA: Diagnosis not present

## 2022-08-04 DIAGNOSIS — Z853 Personal history of malignant neoplasm of breast: Secondary | ICD-10-CM | POA: Diagnosis not present

## 2022-08-04 DIAGNOSIS — E7800 Pure hypercholesterolemia, unspecified: Secondary | ICD-10-CM | POA: Diagnosis not present

## 2022-08-04 DIAGNOSIS — R82998 Other abnormal findings in urine: Secondary | ICD-10-CM | POA: Diagnosis not present

## 2022-11-22 ENCOUNTER — Encounter: Payer: Self-pay | Admitting: *Deleted

## 2022-11-22 ENCOUNTER — Inpatient Hospital Stay: Payer: Medicare HMO | Attending: Hematology and Oncology | Admitting: Hematology and Oncology

## 2022-11-22 ENCOUNTER — Inpatient Hospital Stay: Payer: Medicare HMO

## 2022-11-22 VITALS — BP 154/95 | HR 85 | Temp 97.2°F | Resp 18 | Ht 64.0 in | Wt 182.2 lb

## 2022-11-22 DIAGNOSIS — R5383 Other fatigue: Secondary | ICD-10-CM | POA: Diagnosis not present

## 2022-11-22 DIAGNOSIS — C50412 Malignant neoplasm of upper-outer quadrant of left female breast: Secondary | ICD-10-CM

## 2022-11-22 DIAGNOSIS — Z170 Estrogen receptor positive status [ER+]: Secondary | ICD-10-CM | POA: Insufficient documentation

## 2022-11-22 DIAGNOSIS — G4700 Insomnia, unspecified: Secondary | ICD-10-CM | POA: Insufficient documentation

## 2022-11-22 DIAGNOSIS — G629 Polyneuropathy, unspecified: Secondary | ICD-10-CM | POA: Insufficient documentation

## 2022-11-22 DIAGNOSIS — R413 Other amnesia: Secondary | ICD-10-CM | POA: Diagnosis not present

## 2022-11-22 DIAGNOSIS — Z923 Personal history of irradiation: Secondary | ICD-10-CM | POA: Insufficient documentation

## 2022-11-22 DIAGNOSIS — R6889 Other general symptoms and signs: Secondary | ICD-10-CM | POA: Diagnosis not present

## 2022-11-22 DIAGNOSIS — Z79811 Long term (current) use of aromatase inhibitors: Secondary | ICD-10-CM | POA: Diagnosis not present

## 2022-11-22 DIAGNOSIS — Z79899 Other long term (current) drug therapy: Secondary | ICD-10-CM | POA: Diagnosis not present

## 2022-11-22 DIAGNOSIS — R0781 Pleurodynia: Secondary | ICD-10-CM | POA: Diagnosis not present

## 2022-11-22 LAB — CMP (CANCER CENTER ONLY)
ALT: 16 U/L (ref 0–44)
AST: 17 U/L (ref 15–41)
Albumin: 4.2 g/dL (ref 3.5–5.0)
Alkaline Phosphatase: 64 U/L (ref 38–126)
Anion gap: 5 (ref 5–15)
BUN: 14 mg/dL (ref 8–23)
CO2: 31 mmol/L (ref 22–32)
Calcium: 9.2 mg/dL (ref 8.9–10.3)
Chloride: 106 mmol/L (ref 98–111)
Creatinine: 0.89 mg/dL (ref 0.44–1.00)
GFR, Estimated: >60 mL/min (ref >60)
Glucose, Bld: 94 mg/dL (ref 70–99)
Potassium: 4.2 mmol/L (ref 3.5–5.1)
Sodium: 142 mmol/L (ref 135–145)
Total Bilirubin: 0.6 mg/dL (ref 0.3–1.2)
Total Protein: 7.1 g/dL (ref 6.5–8.1)

## 2022-11-22 LAB — CBC WITH DIFFERENTIAL (CANCER CENTER ONLY)
Abs Immature Granulocytes: 0.01 10*3/uL (ref 0.00–0.07)
Basophils Absolute: 0.1 10*3/uL (ref 0.0–0.1)
Basophils Relative: 1 %
Eosinophils Absolute: 0.1 10*3/uL (ref 0.0–0.5)
Eosinophils Relative: 1 %
HCT: 41 % (ref 36.0–46.0)
Hemoglobin: 13.4 g/dL (ref 12.0–15.0)
Immature Granulocytes: 0 %
Lymphocytes Relative: 44 %
Lymphs Abs: 2.4 10*3/uL (ref 0.7–4.0)
MCH: 26.7 pg (ref 26.0–34.0)
MCHC: 32.7 g/dL (ref 30.0–36.0)
MCV: 81.7 fL (ref 80.0–100.0)
Monocytes Absolute: 0.3 10*3/uL (ref 0.1–1.0)
Monocytes Relative: 6 %
Neutro Abs: 2.6 10*3/uL (ref 1.7–7.7)
Neutrophils Relative %: 48 %
Platelet Count: 165 10*3/uL (ref 150–400)
RBC: 5.02 MIL/uL (ref 3.87–5.11)
RDW: 14.7 % (ref 11.5–15.5)
WBC Count: 5.4 10*3/uL (ref 4.0–10.5)
nRBC: 0 % (ref 0.0–0.2)

## 2022-11-22 NOTE — Progress Notes (Signed)
Patient Care Team: Tisovec, Adelfa Koh, MD as PCP - General (Internal Medicine) Emelia Loron, MD as Consulting Physician (General Surgery) Dorothy Puffer, MD as Consulting Physician (Radiation Oncology)  DIAGNOSIS:  Encounter Diagnosis  Name Primary?   Malignant neoplasm of upper-outer quadrant of left breast in female, estrogen receptor positive (HCC) Yes     CHIEF COMPLIANT: Follow-up on letrozole therapy  INTERVAL HISTORY: Tanya [name] is a 67 y.o with the above mention. She presents to the clinic today for a follow-up. Patient reports that her blood pressure has been high. 170/80. She states that she has a lot of fatigue. She does have pain in the left side rib. It bothers her when she picking and pulling things. From time to time she has body aches and pain. Complains of off balance. Neuropathy in her legs but doesn't bother her. She says memory is off. She is forgetful. Some mild itching under breast.    ALLERGIES:  is allergic to ascorbic acid & derivatives, camoquin [amodiaquine], and vitamin c.  MEDICATIONS:  Current Outpatient Medications  Medication Sig Dispense Refill   amLODipine (NORVASC) 5 MG tablet Take 1 tablet by mouth daily.     anastrozole (ARIMIDEX) 1 MG tablet Take 1 tablet (1 mg total) by mouth daily. 90 tablet 4   losartan (COZAAR) 50 MG tablet Take 1 tablet by mouth once daily 90 tablet 0   Multiple Vitamin (MULTIVITAMIN WITH MINERALS) TABS tablet Take 1 tablet by mouth 3 (three) times a week. One-A-Day Women's 50+     Polyethylene Glycol 3350 (MIRALAX PO) Take by mouth. (Patient not taking: Reported on 12/23/2020)     pravastatin (PRAVACHOL) 40 MG tablet TAKE 1 TABLET BY MOUTH EVERYDAY AT BEDTIME 90 tablet 2   VITAMIN D PO Take by mouth. 1000 IU daily     No current facility-administered medications for this visit.    PHYSICAL EXAMINATION: ECOG PERFORMANCE STATUS: 1 - Symptomatic but completely ambulatory  Vitals:   11/22/22 1042  BP: (!)  154/95  Pulse: 85  Resp: 18  Temp: (!) 97.2 F (36.2 C)  SpO2: 100%   Filed Weights   11/22/22 1042  Weight: 182 lb 3.2 oz (82.6 kg)      LABORATORY DATA:  I have reviewed the data as listed    Latest Ref Rng & Units 11/16/2021   10:21 AM 02/24/2021    5:13 PM 11/20/2018    1:36 PM  CMP  Glucose 70 - 99 mg/dL 191   478   BUN 8 - 23 mg/dL 17   17   Creatinine 2.95 - 1.00 mg/dL 6.21  3.08  6.57   Sodium 135 - 145 mmol/L 140   141   Potassium 3.5 - 5.1 mmol/L 3.6   4.3   Chloride 98 - 111 mmol/L 105   103   CO2 22 - 32 mmol/L 30   27   Calcium 8.9 - 10.3 mg/dL 9.4   9.5   Total Protein 6.5 - 8.1 g/dL 7.3   7.3   Total Bilirubin 0.3 - 1.2 mg/dL 0.5   0.4   Alkaline Phos 38 - 126 U/L 64   82   AST 15 - 41 U/L 19   16   ALT 0 - 44 U/L 19   16     Lab Results  Component Value Date   WBC 5.5 11/16/2021   HGB 13.5 11/16/2021   HCT 40.9 11/16/2021   MCV 79.4 (L) 11/16/2021   PLT  151 11/16/2021   NEUTROABS 2.4 11/16/2021    ASSESSMENT & PLAN:  Malignant neoplasm of upper-outer quadrant of left breast in female, estrogen receptor positive [facility]) February 2018: Left breast UIQ and left axillary lymph node biopsy: Grade 3 IDC T1CN1A stage IIb ER positive PR negative HER2 negative Ki-67 40%, MRI biopsy 05/25/2016: Second area of left breast cancer 05/11/2016: Started neoadjuvant anastrozole MammaPrint: High risk luminal type B 06/09/2016-09/29/2016: Neoadjuvant dose dense Adriamycin and Cytoxan x4 followed by Taxol x8 (stopped for neuropathy) 11/03/2016: Left lumpectomy: Breast showed complete response, 1/2 sentinel nodes positive 02/01/2017: Completed adjuvant radiation   Current treatment: Anastrozole resumed 02/11/2017 Plan to treat her for 7 years   Breast cancer surveillance: 1.  Breast exam 11/22/2022: Benign 2. mammogram: 11/26/2020: Benign 3.  CT chest 02/25/2021: Negative for metastatic disease    Memory loss: I discussed with her about participating in clinical trial for  memory improvement. Fatigue and irritability: Possibly due to letrozole Insomnia: I discussed with her about different measures that she can take to improve her sleep hygiene.   Return to clinic in 1 year for follow-up   Orders Placed This Encounter  Procedures   Ambulatory referral to Cardiology    Referral Priority:   Routine    Referral Type:   Consultation    Referral Reason:   Specialty Services Required    Referred to Provider:   Corky Crafts, MD    Requested Specialty:   Cardiology    Number of Visits Requested:   1   The patient has a good understanding of the overall plan. she agrees with it. she will call with any problems that may develop before the next visit here. Total time spent: 30 mins including face to face time and time spent for planning, charting and co-ordination of care   Tamsen Meek, MD 11/22/22    I Janan Ridge am acting as a Neurosurgeon for [employer]  I have reviewed the above documentation for accuracy and completeness, and I agree with the above.

## 2022-11-22 NOTE — Research (Signed)
[id number]: COGNITIVE TRAINING FOR CANCER RELATED COGNITIVE IMPAIRMENT IN BREAST CANCER SURVIVORS: A MULTI-CENTER RANDOMIZED DOUBLE- BLINDED CONTROLLED TRIAL    Patient Tanya [name] was identified by Dr. Pamelia Hoit as a potential candidate for the above listed study.  This Clinical Research Nurse met with Tanya [name], [id number], on 11/22/22 in a manner and location that ensures patient privacy to discuss participation in the above listed research study.  Patient is Unaccompanied.  A copy of the informed consent document and separate HIPAA Authorization was provided to the patient.  Patient reads, speaks, and understands [place].   Patient was provided with the business card of this Nurse and encouraged to contact the research team with any questions.  Approximately 30 minutes was spent with the patient reviewing the informed consent documents.  Unfortunately, the pt was deemed not eligible for study entry based on she completed her chemotherapy in July 2018 and her RT in November 2018.  It has been greater than 5 years out from her treatment.  To be eligible, the pt must have been between > 6 months to 5 years post-treatment (completion of initial surgery +/- adjuvant chemotherapy/radiation therapy. The pt was disappointed, but she understood why she was not eligible for study enrollment.  Dr. Pamelia Hoit was notified that the pt was not eligible.  Tanya Ridge RN, BSN, CCRP Clinical Research Nurse Lead 11/22/2022 11:57 AM

## 2022-11-22 NOTE — Addendum Note (Signed)
Addended by: Serena Croissant on: 11/22/2022 11:36 AM   Modules accepted: Orders

## 2022-11-22 NOTE — Assessment & Plan Note (Addendum)
February 2018: Left breast UIQ and left axillary lymph node biopsy: Grade 3 IDC T1CN1A stage IIb ER positive PR negative HER2 negative Ki-67 40%, MRI biopsy 05/25/2016: Second area of left breast cancer 05/11/2016: Started neoadjuvant anastrozole MammaPrint: High risk luminal type B 06/09/2016-09/29/2016: Neoadjuvant dose dense Adriamycin and Cytoxan x4 followed by Taxol x8 (stopped for neuropathy) 11/03/2016: Left lumpectomy: Breast showed complete response, 1/2 sentinel nodes positive 02/01/2017: Completed adjuvant radiation   Current treatment: Anastrozole resumed 02/11/2017 Plan to treat her for 7 years   Breast cancer surveillance: 1.  Breast exam 11/22/2022: Benign 2. mammogram: 11/26/2020: Benign 3.  CT chest 02/25/2021: Negative for metastatic disease    Memory loss: I discussed with her about participating in clinical trial for memory improvement. Fatigue and irritability: Possibly due to letrozole Insomnia: I discussed with her about different measures that she can take to improve her sleep hygiene.   Return to clinic in 1 year for follow-up

## 2023-01-02 ENCOUNTER — Other Ambulatory Visit: Payer: Self-pay | Admitting: Hematology and Oncology

## 2023-01-02 NOTE — Telephone Encounter (Signed)
Per last OV, continue anastrazole. Lorayne Marek, RN

## 2023-02-07 DIAGNOSIS — Z853 Personal history of malignant neoplasm of breast: Secondary | ICD-10-CM | POA: Diagnosis not present

## 2023-02-07 DIAGNOSIS — I10 Essential (primary) hypertension: Secondary | ICD-10-CM | POA: Diagnosis not present

## 2023-02-07 DIAGNOSIS — E669 Obesity, unspecified: Secondary | ICD-10-CM | POA: Diagnosis not present

## 2023-02-07 DIAGNOSIS — Z23 Encounter for immunization: Secondary | ICD-10-CM | POA: Diagnosis not present

## 2023-02-07 DIAGNOSIS — M1990 Unspecified osteoarthritis, unspecified site: Secondary | ICD-10-CM | POA: Diagnosis not present

## 2023-02-07 DIAGNOSIS — F419 Anxiety disorder, unspecified: Secondary | ICD-10-CM | POA: Diagnosis not present

## 2023-02-07 DIAGNOSIS — E7800 Pure hypercholesterolemia, unspecified: Secondary | ICD-10-CM | POA: Diagnosis not present

## 2023-02-07 DIAGNOSIS — E119 Type 2 diabetes mellitus without complications: Secondary | ICD-10-CM | POA: Diagnosis not present

## 2023-02-20 NOTE — Progress Notes (Unsigned)
  Cardiology Office Note:  .   Date:  02/20/2023  ID:  Tanya [name], DOB Oct 23, 1955, MRN [id number] PCP: Gaspar Garbe, MD  [facility] Providers Cardiologist:  None { Click to update primary MD,subspecialty MD or APP then REFRESH:1}   History of Present Illness: .    Dec. 10, 2024  Tanya [name] is a 67 y.o. female *** With HTN, recent breast cancer  Has previously seen Dr. Eden Emms   Has had very variable BP  She avoid salt for the most part   She takes the Losartan 25 mg in the morning ( actually takes at various times of the day  Takes amlodipine 5 mg at night ( takes anytime during the day when she thinks she needs it )    Walks around inside her house Does not exercise   Drinks 48 oz - 64  of water        ROS: ***  Studies Reviewed: .        *** Risk Assessment/Calculations:   {Does this patient have ATRIAL FIBRILLATION?:[phone]} No BP recorded.  {Refresh Note OR Click here to enter BP  :1}***       Physical Exam:   VS:  There were no vitals taken for this visit.   Wt Readings from Last 3 Encounters:  11/22/22 182 lb 3.2 oz (82.6 kg)  11/16/21 185 lb 3.2 oz (84 kg)  02/16/21 180 lb (81.6 kg)    GEN: Well nourished, well developed in no acute distress NECK: No JVD; No carotid bruits CARDIAC: ***RRR, no murmurs, rubs, gallops RESPIRATORY:  Clear to auscultation without rales, wheezing or rhonchi  ABDOMEN: Soft, non-tender, non-distended EXTREMITIES:  No edema; No deformity   ASSESSMENT AND PLAN: .    Marland Kitchen 1.  Hypertension: Tanya [name] presents for further management of her blood pressure.  She complains that her blood pressure runs from being way too high to way too low.  Upon further questioning, it is clear that she takes her blood pressure multiple times every day.  She does not take her blood pressure on any regular basis but takes it only when she thinks she needs it.  She wakes up all through the night and takes her blood  pressure when her blood pressure reads high.  She then starts the same process during the day.  I think that this is clearly not productive.  Will discontinue the amlodipine.  I will have her take the losartan 25 mg twice a day.  She has been instructed to to record her blood pressure only once a day, about an hour after she takes her morning meds.  I have encouraged her to drink more fluid.  She clearly has symptoms of orthostatic hypotension.  I have encouraged her to walk at least 3 to 4 miles a day 3-4 times a week.    {Are you ordering a CV Procedure (e.g. stress test, cath, DCCV, TEE, etc)?   Press F2        :[phone]}  Dispo: ***  Signed, Kristeen Miss, MD

## 2023-02-21 ENCOUNTER — Encounter: Payer: Self-pay | Admitting: Cardiovascular Disease

## 2023-02-21 ENCOUNTER — Ambulatory Visit: Payer: Medicare HMO | Attending: Cardiovascular Disease | Admitting: Cardiovascular Disease

## 2023-02-21 VITALS — BP 118/64 | HR 88 | Ht 64.0 in | Wt 184.6 lb

## 2023-02-21 DIAGNOSIS — R6889 Other general symptoms and signs: Secondary | ICD-10-CM | POA: Diagnosis not present

## 2023-02-21 DIAGNOSIS — Z79899 Other long term (current) drug therapy: Secondary | ICD-10-CM | POA: Diagnosis not present

## 2023-02-21 DIAGNOSIS — I10 Essential (primary) hypertension: Secondary | ICD-10-CM

## 2023-02-21 DIAGNOSIS — Z09 Encounter for follow-up examination after completed treatment for conditions other than malignant neoplasm: Secondary | ICD-10-CM

## 2023-02-21 MED ORDER — LOSARTAN POTASSIUM 25 MG PO TABS
25.0000 mg | ORAL_TABLET | Freq: Two times a day (BID) | ORAL | 3 refills | Status: AC
Start: 2023-02-21 — End: ?

## 2023-02-21 NOTE — Patient Instructions (Signed)
Medication Instructions:  INCREASE Losartan 25mg  twice daily STOP Amlodipine *If you need a refill on your cardiac medications before your next appointment, please call your pharmacy*  Lab Work: BMET in 3 months If you have labs (blood work) drawn today and your tests are completely normal, you will receive your results only by: MyChart Message (if you have MyChart) OR A paper copy in the mail If you have any lab test that is abnormal or we need to change your treatment, we will call you to review the results.  Follow-Up: At [facility], you and your health needs are our priority.  As part of our continuing mission to provide you with exceptional heart care, we have created designated Provider Care Teams.  These Care Teams include your primary Cardiologist (physician) and Advanced Practice Providers (APPs -  Physician Assistants and Nurse Practitioners) who all work together to provide you with the care you need, when you need it.  Your next appointment:   3 month(s)  Provider:   APP

## 2023-03-14 ENCOUNTER — Telehealth: Payer: Self-pay | Admitting: Cardiovascular Disease

## 2023-03-14 DIAGNOSIS — I10 Essential (primary) hypertension: Secondary | ICD-10-CM

## 2023-03-14 NOTE — Telephone Encounter (Signed)
Per pt 162/119 and 170/119 are before med and the remaining readings are after Losartan. Losartan was changed to 25 mg bid at last office visit and Amlodipine was stopped ./cy

## 2023-03-14 NOTE — Telephone Encounter (Signed)
 Pt c/o BP issue: STAT if pt c/o blurred vision, one-sided weakness or slurred speech  1. What are your last 5 BP readings?    170/119 154/103 151/103 136/99 134/105 162/119  2. Are you having any other symptoms (ex. Dizziness, headache, blurred vision, passed out)? Dull headache   3. What is your BP issue? Pt called in stating since medication change, her bp is still elevated. Please advise.

## 2023-03-15 ENCOUNTER — Ambulatory Visit (HOSPITAL_COMMUNITY)
Admission: EM | Admit: 2023-03-15 | Discharge: 2023-03-15 | Disposition: A | Discharge status: Home / Self Care | Payer: Medicare HMO | Attending: Physician Assistant | Admitting: Physician Assistant

## 2023-03-15 ENCOUNTER — Encounter (HOSPITAL_COMMUNITY): Payer: Self-pay

## 2023-03-15 DIAGNOSIS — R030 Elevated blood-pressure reading, without diagnosis of hypertension: Secondary | ICD-10-CM | POA: Diagnosis not present

## 2023-03-15 DIAGNOSIS — I998 Other disorder of circulatory system: Secondary | ICD-10-CM | POA: Diagnosis not present

## 2023-03-15 LAB — POCT URINALYSIS DIP (MANUAL ENTRY)
Bilirubin, UA: NEGATIVE
Blood, UA: NEGATIVE
Glucose, UA: NEGATIVE mg/dL
Ketones, POC UA: NEGATIVE mg/dL
Leukocytes, UA: NEGATIVE
Nitrite, UA: NEGATIVE
Protein Ur, POC: NEGATIVE mg/dL
Spec Grav, UA: 1.015 (ref 1.010–1.025)
Urobilinogen, UA: 0.2 U/dL
pH, UA: 5.5 (ref 5.0–8.0)

## 2023-03-15 MED ORDER — AMLODIPINE BESYLATE 5 MG PO TABS: 5.0000 mg | ORAL_TABLET | Freq: Every day | ORAL | 0 refills | Status: AC

## 2023-03-15 NOTE — Discharge Instructions (Signed)
 Your blood pressure was initially elevated but on recheck it was more appropriate.  I am hesitant to start or change her medication at this time because I am concerned that it be too much medication that will cause your blood pressure to drop.  Continue losartan  25 mg twice daily as prescribed by your cardiologist.  Limit salt in your diet and make sure that you are drinking plenty of water.  Monitor your blood pressure 1 times daily and keep a log for evaluation at follow-up appointment.  Follow-up with your primary care next week.  If you develop any chest pain, shortness of breath, headache, vision change, dizziness in the setting of high blood pressure you should go to the emergency room.

## 2023-03-15 NOTE — ED Notes (Signed)
 BP taken with patients cuff and ours for comparison.  BP on pts cuff 137/99 and with ours 130/88.

## 2023-03-15 NOTE — ED Provider Notes (Signed)
 [facility]    CSN: [id number] Arrival date & time: 03/15/23  1133      History   Chief Complaint Chief Complaint  Patient presents with   Hypertension    HPI Tanya [name] is a 68 y.o. female.   Patient presents today with a several week history of elevated blood pressures.  She has a history of labile blood pressures and has been seen by cardiology with most recent visit and 02/21/2023 at which point they increased her losartan  to 25 mg twice daily and discontinued amlodipine  5 mg at night.  Previous regimen was losartan  25 mg in the morning and amlodipine  5 mg at night.  Since then she has been monitoring her blood pressure and her systolic has been ranging from 155-175 and diastolic has been ranging from 100-115.  She has had some bodyaches but denies any additional symptoms including chest pain, shortness of breath, headache, vision change, dizziness.  She denies any medication today and is not take NSAIDs regularly.  She does not smoke.  She denies any increase in her sodium consumption or change in her activity level.  She has been drinking plenty of fluid.    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   Neuromuscular disorder (HCC)    neuropath   OBESITY    Personal history of chemotherapy    Personal history of radiation therapy    Pre-diabetes    no medications   Sickle cell anemia (HCC)    trait    Patient Active Problem List   Diagnosis Date Noted   Insomnia 08/09/2018   Malignant neoplasm of upper-outer quadrant of left breast in female, 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   Encounter for preventative adult health care examination 01/26/2016   Diabetes mellitus without complication (HCC) 06/16/2014   Murmur 02/25/2013    Tachycardia 05/26/2009   Hyperlipidemia 02/10/2009   Obesity 02/10/2009   Anxiety state 02/10/2009   HYPERTENSION, BENIGN ESSENTIAL 06/01/2006    Past Surgical History:  Procedure Laterality Date   BREAST BIOPSY Left 04/29/2016   BREAST BIOPSY Left 05/25/2016   BREAST LUMPECTOMY Left 10/2016   BREAST LUMPECTOMY WITH RADIOACTIVE SEED AND SENTINEL LYMPH NODE BIOPSY Left 11/03/2016   Procedure: INJECT BLUE DYE LEFT BREAST, LEFT BREAST RADIOACTIVE SEED GUIDED LUMPECTOMY WITH LEFT SENTINEL LYMPH NODE BIOPSY AND RADIOACTIVE SEED TARGETED AXILLARY LYMPH NODE EXCISION;  Surgeon: Ebbie Cough, MD;  Location: [facility] OR;  Service: General;  Laterality: Left;   CESAREAN SECTION     PORT-A-CATH REMOVAL Right 11/03/2016   Procedure: REMOVAL PORT-A-CATH;  Surgeon: Ebbie Cough, MD;  Location: [facility] OR;  Service: General;  Laterality: Right;   PORTACATH PLACEMENT Right 06/02/2016   Procedure: INSERTION PORT-A-CATH WITH ULTRASOUND GUIDANCE;  Surgeon: Cough Ebbie, MD;  Location: [facility];  Service: General;  Laterality: Right;   TUBAL LIGATION      OB History   No obstetric history on file.      Home Medications    Prior to Admission medications   Medication Sig Start Date End Date Taking? Authorizing Provider  amLODipine  (NORVASC ) 5 MG tablet Take 1 tablet (5 mg total) by mouth daily. 03/15/23  Yes Idonia Zollinger K, PA-C  losartan  (COZAAR ) 25 MG tablet Take 1 tablet (25  mg total) by mouth 2 (two) times daily. 02/21/23  Yes Nahser, Aleene PARAS, MD  anastrozole  (ARIMIDEX ) 1 MG tablet TAKE 1 TABLET BY MOUTH EVERY DAY 01/02/23   Odean Potts, MD  ascorbic acid (VITAMIN C) 1000 MG tablet Take 1 tablet po qd Oral 09/27/18   [provider]  Multiple Vitamin (MULTIVITAMIN WITH MINERALS) TABS tablet Take 0.5 tablets by mouth daily. One-A-Day Women's 50+    [provider]  Polyethylene Glycol 3350  (MIRALAX PO) Take by mouth as needed (constipation).    [provider]  pravastatin  (PRAVACHOL ) 40 MG tablet TAKE 1 TABLET BY MOUTH EVERYDAY AT BEDTIME 01/21/19   Luke Vernell BRAVO, MD  VITAMIN D PO Take by mouth. 1000 IU daily    [provider]    Family History Family History  Problem Relation Age of Onset   Breast cancer Mother 31   Colon cancer Neg Hx    Colon polyps Neg Hx    Esophageal cancer Neg Hx    Rectal cancer Neg Hx    Stomach cancer Neg Hx     Social History Social History   Tobacco Use   Smoking status: Never   Smokeless tobacco: Never  Vaping Use   Vaping status: Never Used  Substance Use Topics   Alcohol use: Yes    Comment: rarely   Drug use: No     Allergies   Ascorbic acid & derivatives, Camoquin [amodiaquine], and Vitamin c   Review of Systems Review of Systems  Constitutional:  Negative for activity change, appetite change, fatigue and fever.  Eyes:  Negative for visual disturbance.  Respiratory:  Negative for shortness of breath.   Cardiovascular:  Negative for chest pain, palpitations and leg swelling.  Gastrointestinal:  Negative for abdominal pain, diarrhea, nausea and vomiting.  Neurological:  Negative for dizziness and headaches.     Physical Exam Triage Vital Signs ED Triage Vitals  Encounter Vitals Group     BP 03/15/23 1155 (!) 168/113     Systolic BP Percentile --      Diastolic BP Percentile --      Pulse Rate 03/15/23 1155 85     Resp 03/15/23 1155 18     Temp 03/15/23 1155 98.2 F (36.8 C)     Temp Source 03/15/23 1155 Oral     SpO2 03/15/23 1155 98 %     Weight 03/15/23 1201 185 lb (83.9 kg)     Height 03/15/23 1201 5' 4 (1.626 m)     Head Circumference --      Peak Flow --      Pain Score 03/15/23 1200 0     Pain Loc --      Pain Education --      Exclude from Growth Chart --    Orthostatic VS for the past 24 hrs:  BP- Lying Pulse- Lying BP- Sitting Pulse- Sitting BP- Standing at 0 minutes Pulse- Standing at 0 minutes  03/15/23 1241 143/89 70 (!) 136/92 78  129/89 86    Updated Vital Signs BP (!) 138/92 (BP Location: Right Arm)   Pulse 82   Temp 98.2 F (36.8 C) (Oral)   Resp 18   Ht 5' 4 (1.626 m)   Wt 185 lb (83.9 kg)   SpO2 98%   BMI 31.76 kg/m   Visual Acuity Right Eye Distance:   Left Eye Distance:   Bilateral Distance:    Right Eye Near:   Left Eye Near:  Bilateral Near:     Physical Exam Vitals reviewed.  Constitutional:      General: She is awake. She is not in acute distress.    Appearance: Normal appearance. She is well-developed. She is not ill-appearing.     Comments: Very pleasant female appears stated age no acute distress sitting up to the exam room  HENT:     Head: Normocephalic and atraumatic.  Cardiovascular:     Rate and Rhythm: Normal rate and regular rhythm.     Heart sounds: Normal heart sounds, S1 normal and S2 normal. No murmur heard. Pulmonary:     Effort: Pulmonary effort is normal.     Breath sounds: Normal breath sounds. No wheezing, rhonchi or rales.     Comments: Clear to auscultation bilaterally Abdominal:     Palpations: Abdomen is soft.     Tenderness: There is no abdominal tenderness.  Psychiatric:        Behavior: Behavior is cooperative.      UC Treatments / Results  Labs (all labs ordered are listed, but only abnormal results are displayed) Labs Reviewed  POCT URINALYSIS DIP (MANUAL ENTRY)    EKG   Radiology No results found.  Procedures Procedures (including critical care time)  Medications Ordered in UC Medications - No data to display  Initial Impression / Assessment and Plan / UC Course  I have reviewed the triage vital signs and the nursing notes.  Pertinent labs & imaging results that were available during my care of the patient were reviewed by me and considered in my medical decision making (see chart for details).     Patient blood pressure was initially significantly elevated.  We did recheck it in various positions and this had improved but  remains slightly elevated.  She provides a log of blood pressures that are consistently above goal despite medication compliance.  Initially we discussed that given her blood pressure and after sitting quietly for several minutes we do not need to make an adjustment and she can monitor this closely, however, she is very concerned that her blood pressure remains very elevated and would like to restart her amlodipine  as she had better success with this medication.  Her blood pressure is high enough that she would likely tolerate this and so 5 mg amlodipine  was added at night.  She was encouraged to monitor her blood pressure closely and if she has any symptoms of hypotension or if her blood pressure is below 110 systolic and 70 diastolic she should be evaluated again so we can adjust her medication.  We discussed lifestyle modification for additional symptom management.  We did compare in clinic blood pressure with her blood pressure cuff and this was close indicating that her blood pressure cuff is likely accurate.  UA was normal with no evidence of proteinuria.  Strict return precautions given.  All questions answered to patient satisfaction.  Final Clinical Impressions(s) / UC Diagnoses   Final diagnoses:  Blood pressure instability  Elevated blood pressure reading     Discharge Instructions      Your blood pressure was initially elevated but on recheck it was more appropriate.  I am hesitant to start or change her medication at this time because I am concerned that it be too much medication that will cause your blood pressure to drop.  Continue losartan  25 mg twice daily as prescribed by your cardiologist.  Limit salt in your diet and make sure that you are drinking plenty of water.  Monitor your blood pressure 1 times daily and keep a log for evaluation at follow-up appointment.  Follow-up with your primary care next week.  If you develop any chest pain, shortness of breath, headache, vision change,  dizziness in the setting of high blood pressure you should go to the emergency room.     ED Prescriptions     Medication Sig Dispense Auth. Provider   amLODipine  (NORVASC ) 5 MG tablet Take 1 tablet (5 mg total) by mouth daily. 30 tablet Marciel Offenberger K, PA-C      PDMP not reviewed this encounter.   Sherrell Rocky POUR, PA-C 03/15/23 1325

## 2023-03-15 NOTE — ED Triage Notes (Addendum)
 Pt presents to urgent care with high blood pressure. Pt states she took her BP medication, 25 mg Losartan , as prescribed this morning at 0830 (takes 25 mg in morning & 25 mg at night). Pt checked her BP reading at 1030 and it was 171/117. Only symptom pt is reporting at this time is dizziness. Pt states she has been taking her medications as prescribed.

## 2023-03-24 MED ORDER — POTASSIUM CHLORIDE CRYS ER 20 MEQ PO TBCR: 20.0000 meq | EXTENDED_RELEASE_TABLET | Freq: Every day | ORAL | 3 refills | Status: DC

## 2023-03-24 MED ORDER — HYDROCHLOROTHIAZIDE 25 MG PO TABS: 25.0000 mg | ORAL_TABLET | Freq: Every day | ORAL | 3 refills | Status: DC

## 2023-03-24 NOTE — Telephone Encounter (Signed)
 Spoke with patient, blood pressure still consistently being elevates (today 145/106 after medication). Patient agrees with plan to start hydrochlorothiazide  25 mg daily, potassium 20 meq daily, and recheck BMET on Tuesday, January 28 at [facility] (order released) in our office. Patient will continue to monitor her BP.   Nahser, Aleene PARAS, MD  Verta Lauraine POUR, RN2 days ago   Lets add Hydrochlorothiazide  25 mg a day Kdur 20 meq a day  BMP in 2-3 weeks  Continue to record BP  PN

## 2023-03-27 ENCOUNTER — Telehealth: Payer: Self-pay | Admitting: Cardiovascular Disease

## 2023-03-27 NOTE — Telephone Encounter (Signed)
 Pt seen at [facility]: Initially we discussed that given her blood pressure and after sitting quietly for several minutes we do not need to make an adjustment and she can monitor this closely, however, she is very concerned that her blood pressure remains very elevated and would like to restart her amlodipine  as she had better success with this medication.  Her blood pressure is high enough that she would likely tolerate this and so 5 mg amlodipine  was added at night.  She was encouraged to monitor her blood pressure closely and if she has any symptoms of hypotension or if her blood pressure is below 110 systolic and 70 diastolic she should be evaluated again so we can adjust her medication.   Returned call to patient informing her that hydrochlorothiazide  was added when Amlodipine  was stopped once she reported that increasing her Losartan  to twice daily wasn't working and her BP was still high (OV 12/10, note on 03/14/23). She was seen at UC above and wanted to go back on Amlodipine  which she has done and reports good BP response. Advised her to NOT take hydrochlorothiazide  as this was to replace Amlodipine  initially. She verbalized understanding and read-back what she should be taking. No further questions. Hydrochlorothiazide  removed from medication list at this time.

## 2023-03-27 NOTE — Telephone Encounter (Signed)
 Pt c/o BP issue: STAT if pt c/o blurred vision, one-sided weakness or slurred speech  1. What are your last 5 BP readings?   01/11 Morning- 121/95 10:30 PM - 93/76 01/12 Morning- 121/91 1:30 PM  - 107/75 7:30 PM 136/91 10:15 PM 115/82 01/13 Morning 115/90 2. Are you having any other symptoms (ex. Dizziness, headache, blurred vision, passed out)?   3. What is your BP issue? Patient called about her BP being high (see phone encounter from 03/14/23). Patient was informed to start taking Hydrochlorothiazide  25 mg a day, but since her bp was running low this weekend she did not take the medication. Patient would like to know if she should still start the medication. Please advise.

## 2023-05-22 ENCOUNTER — Telehealth: Payer: Self-pay | Admitting: Cardiovascular Disease

## 2023-05-22 NOTE — Telephone Encounter (Signed)
Patient is requesting to speak with a nurse. Please advise.

## 2023-05-22 NOTE — Telephone Encounter (Signed)
 Left a message for the pt to call back.

## 2023-05-23 ENCOUNTER — Ambulatory Visit: Payer: Medicare HMO | Admitting: Cardiovascular Disease

## 2023-05-24 NOTE — Telephone Encounter (Signed)
 Left detailed message for pt to call office back if needed or send message through MyChart.

## 2023-05-26 NOTE — Telephone Encounter (Signed)
 Returned call to patient who states that she had cancelled her appt last week and didn't know why she needed the visit. Explained that it was because her BP was labile and he wanted to keep a close eye on it since changing her medications. She says that she went to urgent care twice about blood pressure and is now taking Losartan and Amlodipine now and BP has been much better. She states "it's not great all of the time." She states that she does think its a good idea to be seen by a provider for it, but declined 6 date/times that I proposed to her for appt and stated "I'll just check my calendar and call you back." She knows that she was supposed to get a BMET after starting Losartan, but states that she didn't. She says she will get this done before coming back for a visit. She will call office back.

## 2023-06-01 DIAGNOSIS — I10 Essential (primary) hypertension: Secondary | ICD-10-CM | POA: Diagnosis not present

## 2023-06-01 LAB — BASIC METABOLIC PANEL
BUN/Creatinine Ratio: 16 (ref 12–28)
BUN: 12 mg/dL (ref 8–27)
CO2: 25 mmol/L (ref 20–29)
Calcium: 9.4 mg/dL (ref 8.7–10.3)
Chloride: 103 mmol/L (ref 96–106)
Creatinine, Ser: 0.77 mg/dL (ref 0.57–1.00)
Glucose: 89 mg/dL (ref 70–99)
Potassium: 4 mmol/L (ref 3.5–5.2)
Sodium: 142 mmol/L (ref 134–144)
eGFR: 84 mL/min/{1.73_m2} (ref >59)

## 2023-06-02 ENCOUNTER — Encounter: Payer: Self-pay | Admitting: Cardiovascular Disease

## 2023-06-13 ENCOUNTER — Other Ambulatory Visit: Payer: Self-pay | Admitting: Internal Medicine

## 2023-06-13 DIAGNOSIS — Z1231 Encounter for screening mammogram for malignant neoplasm of breast: Secondary | ICD-10-CM

## 2023-06-29 ENCOUNTER — Ambulatory Visit
Admission: RE | Admit: 2023-06-29 | Discharge: 2023-06-29 | Disposition: A | Discharge status: Home / Self Care | Source: Ambulatory Visit | Attending: Internal Medicine | Admitting: Internal Medicine

## 2023-06-29 DIAGNOSIS — Z1231 Encounter for screening mammogram for malignant neoplasm of breast: Secondary | ICD-10-CM

## 2023-08-15 DIAGNOSIS — E119 Type 2 diabetes mellitus without complications: Secondary | ICD-10-CM | POA: Diagnosis not present

## 2023-08-15 DIAGNOSIS — I10 Essential (primary) hypertension: Secondary | ICD-10-CM | POA: Diagnosis not present

## 2023-08-15 DIAGNOSIS — E7800 Pure hypercholesterolemia, unspecified: Secondary | ICD-10-CM | POA: Diagnosis not present

## 2023-08-22 DIAGNOSIS — R82998 Other abnormal findings in urine: Secondary | ICD-10-CM | POA: Diagnosis not present

## 2023-08-22 DIAGNOSIS — Z1331 Encounter for screening for depression: Secondary | ICD-10-CM | POA: Diagnosis not present

## 2023-08-22 DIAGNOSIS — Z853 Personal history of malignant neoplasm of breast: Secondary | ICD-10-CM | POA: Diagnosis not present

## 2023-08-22 DIAGNOSIS — E119 Type 2 diabetes mellitus without complications: Secondary | ICD-10-CM | POA: Diagnosis not present

## 2023-08-22 DIAGNOSIS — E7800 Pure hypercholesterolemia, unspecified: Secondary | ICD-10-CM | POA: Diagnosis not present

## 2023-08-22 DIAGNOSIS — M1990 Unspecified osteoarthritis, unspecified site: Secondary | ICD-10-CM | POA: Diagnosis not present

## 2023-08-22 DIAGNOSIS — I10 Essential (primary) hypertension: Secondary | ICD-10-CM | POA: Diagnosis not present

## 2023-08-22 DIAGNOSIS — F419 Anxiety disorder, unspecified: Secondary | ICD-10-CM | POA: Diagnosis not present

## 2023-08-22 DIAGNOSIS — Z0000 Encounter for general adult medical examination without abnormal findings: Secondary | ICD-10-CM | POA: Diagnosis not present

## 2023-08-22 DIAGNOSIS — E669 Obesity, unspecified: Secondary | ICD-10-CM | POA: Diagnosis not present

## 2023-08-22 DIAGNOSIS — Z1339 Encounter for screening examination for other mental health and behavioral disorders: Secondary | ICD-10-CM | POA: Diagnosis not present

## 2023-08-22 DIAGNOSIS — G4700 Insomnia, unspecified: Secondary | ICD-10-CM | POA: Diagnosis not present

## 2023-10-31 DIAGNOSIS — H0288B Meibomian gland dysfunction left eye, upper and lower eyelids: Secondary | ICD-10-CM | POA: Diagnosis not present

## 2023-10-31 DIAGNOSIS — H0288A Meibomian gland dysfunction right eye, upper and lower eyelids: Secondary | ICD-10-CM | POA: Diagnosis not present

## 2023-10-31 DIAGNOSIS — H43811 Vitreous degeneration, right eye: Secondary | ICD-10-CM | POA: Diagnosis not present

## 2023-10-31 DIAGNOSIS — H25813 Combined forms of age-related cataract, bilateral: Secondary | ICD-10-CM | POA: Diagnosis not present

## 2023-11-21 ENCOUNTER — Inpatient Hospital Stay: Payer: Medicare HMO | Attending: Hematology and Oncology | Admitting: Hematology and Oncology

## 2023-11-21 ENCOUNTER — Telehealth: Payer: Self-pay | Admitting: *Deleted

## 2023-11-21 ENCOUNTER — Inpatient Hospital Stay

## 2023-11-21 ENCOUNTER — Other Ambulatory Visit: Payer: Self-pay | Admitting: *Deleted

## 2023-11-21 VITALS — BP 136/84 | HR 75 | Temp 97.9°F | Resp 18 | Ht 63.0 in | Wt 183.2 lb

## 2023-11-21 DIAGNOSIS — R600 Localized edema: Secondary | ICD-10-CM | POA: Diagnosis not present

## 2023-11-21 DIAGNOSIS — G629 Polyneuropathy, unspecified: Secondary | ICD-10-CM | POA: Insufficient documentation

## 2023-11-21 DIAGNOSIS — Z0000 Encounter for general adult medical examination without abnormal findings: Secondary | ICD-10-CM

## 2023-11-21 DIAGNOSIS — Z170 Estrogen receptor positive status [ER+]: Secondary | ICD-10-CM | POA: Diagnosis not present

## 2023-11-21 DIAGNOSIS — I10 Essential (primary) hypertension: Secondary | ICD-10-CM | POA: Insufficient documentation

## 2023-11-21 DIAGNOSIS — Z923 Personal history of irradiation: Secondary | ICD-10-CM | POA: Diagnosis not present

## 2023-11-21 DIAGNOSIS — Z79899 Other long term (current) drug therapy: Secondary | ICD-10-CM | POA: Insufficient documentation

## 2023-11-21 DIAGNOSIS — Z1732 Human epidermal growth factor receptor 2 negative status: Secondary | ICD-10-CM | POA: Diagnosis not present

## 2023-11-21 DIAGNOSIS — Z79811 Long term (current) use of aromatase inhibitors: Secondary | ICD-10-CM | POA: Diagnosis not present

## 2023-11-21 DIAGNOSIS — C50412 Malignant neoplasm of upper-outer quadrant of left female breast: Secondary | ICD-10-CM

## 2023-11-21 DIAGNOSIS — G4700 Insomnia, unspecified: Secondary | ICD-10-CM | POA: Insufficient documentation

## 2023-11-21 DIAGNOSIS — I959 Hypotension, unspecified: Secondary | ICD-10-CM | POA: Diagnosis not present

## 2023-11-21 DIAGNOSIS — E860 Dehydration: Secondary | ICD-10-CM | POA: Diagnosis not present

## 2023-11-21 DIAGNOSIS — Z1722 Progesterone receptor negative status: Secondary | ICD-10-CM | POA: Diagnosis not present

## 2023-11-21 DIAGNOSIS — Z7182 Exercise counseling: Secondary | ICD-10-CM | POA: Diagnosis not present

## 2023-11-21 LAB — CBC WITH DIFFERENTIAL (CANCER CENTER ONLY)
Abs Immature Granulocytes: 0.01 K/uL (ref 0.00–0.07)
Basophils Absolute: 0.1 K/uL (ref 0.0–0.1)
Basophils Relative: 1 %
Eosinophils Absolute: 0.1 K/uL (ref 0.0–0.5)
Eosinophils Relative: 3 %
HCT: 41 % (ref 36.0–46.0)
Hemoglobin: 13.5 g/dL (ref 12.0–15.0)
Immature Granulocytes: 0 %
Lymphocytes Relative: 49 %
Lymphs Abs: 2.6 K/uL (ref 0.7–4.0)
MCH: 26.3 pg (ref 26.0–34.0)
MCHC: 32.9 g/dL (ref 30.0–36.0)
MCV: 79.9 fL — ABNORMAL LOW (ref 80.0–100.0)
Monocytes Absolute: 0.3 K/uL (ref 0.1–1.0)
Monocytes Relative: 6 %
Neutro Abs: 2.2 K/uL (ref 1.7–7.7)
Neutrophils Relative %: 41 %
Platelet Count: 170 K/uL (ref 150–400)
RBC: 5.13 MIL/uL — ABNORMAL HIGH (ref 3.87–5.11)
RDW: 14.6 % (ref 11.5–15.5)
WBC Count: 5.3 K/uL (ref 4.0–10.5)
nRBC: 0 % (ref 0.0–0.2)

## 2023-11-21 LAB — CMP (CANCER CENTER ONLY)
ALT: 16 U/L (ref 0–44)
AST: 15 U/L (ref 15–41)
Albumin: 4.4 g/dL (ref 3.5–5.0)
Alkaline Phosphatase: 73 U/L (ref 38–126)
Anion gap: 5 (ref 5–15)
BUN: 12 mg/dL (ref 8–23)
CO2: 30 mmol/L (ref 22–32)
Calcium: 9.3 mg/dL (ref 8.9–10.3)
Chloride: 106 mmol/L (ref 98–111)
Creatinine: 0.75 mg/dL (ref 0.44–1.00)
GFR, Estimated: >60 mL/min (ref >60)
Glucose, Bld: 88 mg/dL (ref 70–99)
Potassium: 4 mmol/L (ref 3.5–5.1)
Sodium: 141 mmol/L (ref 135–145)
Total Bilirubin: 0.5 mg/dL (ref 0.0–1.2)
Total Protein: 7.3 g/dL (ref 6.5–8.1)

## 2023-11-21 NOTE — Telephone Encounter (Signed)
 RN placed call to pt with CMP results all showing WNL.  Pt educated and verbalized understanding.

## 2023-11-21 NOTE — Assessment & Plan Note (Signed)
 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  Breast exam 11/21/2023: Benign 2. mammogram: 07/04/2023: Benign, breast density category B 3.  CT chest 02/25/2021: Negative for metastatic disease    Fatigue and irritability: Possibly due to letrozole Insomnia: I discussed with her about different measures that she can take to improve her sleep hygiene.     Return to clinic in 1 year for follow-up

## 2023-11-21 NOTE — Progress Notes (Signed)
 Patient Care Team: Pcp, No as PCP - General Ebbie Cough, MD as Consulting Physician (General Surgery) Dewey Rush, MD as Consulting Physician (Radiation Oncology)  DIAGNOSIS:  Encounter Diagnosis  Name Primary?   Malignant neoplasm of upper-outer quadrant of left breast in female, estrogen receptor positive (HCC) Yes      CHIEF COMPLIANT: Surveillance of breast cancer  HISTORY OF PRESENT ILLNESS:   History of Present Illness Tanya [name] is a 68 year old female with hypertension who presents with concerns about low blood pressure and dehydration.  She experiences episodes of hypotension, leading to weakness and confusion, with an incident in August resulting in an inability to ambulate without assistance. These episodes often coincide with low blood pressure readings, and she feels dehydrated despite adequate water intake.  She is on antihypertensive medications, including losartan  and amlodipine , and has adjusted her intake based on her readings. During the holiday season, her blood pressure was extremely high, prompting a change in her regimen to losartan  twice daily, later adjusted to include amiodarone and hydrochlorothiazide  after an urgent care visit.  She is concerned about traveling to [place] in December due to these episodes, which have occurred during previous trips. Weakness and a need to drink water typically resolve within ten minutes.  Occasional leg edema improves with elevation, and she has stopped driving due to concerns about her hypotension affecting her ability to drive safely.     ALLERGIES:  is allergic to ascorbic acid & derivatives, camoquin [amodiaquine], and vitamin c.  MEDICATIONS:  Current Outpatient Medications  Medication Sig Dispense Refill   amLODipine  (NORVASC ) 5 MG tablet Take 1 tablet (5 mg total) by mouth daily. 30 tablet 0   anastrozole  (ARIMIDEX ) 1 MG tablet TAKE 1 TABLET BY MOUTH EVERY DAY 90 tablet 4   ascorbic acid  (VITAMIN C) 1000 MG tablet Take 1 tablet po qd Oral     losartan  (COZAAR ) 25 MG tablet Take 1 tablet (25 mg total) by mouth 2 (two) times daily. 180 tablet 3   Multiple Vitamin (MULTIVITAMIN WITH MINERALS) TABS tablet Take 0.5 tablets by mouth daily. One-A-Day Women's 50+     Polyethylene Glycol 3350  (MIRALAX PO) Take by mouth as needed (constipation).     pravastatin  (PRAVACHOL ) 40 MG tablet TAKE 1 TABLET BY MOUTH EVERYDAY AT BEDTIME 90 tablet 2   VITAMIN D PO Take by mouth. 1000 IU daily     No current facility-administered medications for this visit.    PHYSICAL EXAMINATION: ECOG PERFORMANCE STATUS: 1 - Symptomatic but completely ambulatory  Vitals:   11/21/23 1119  BP: 136/84  Pulse: 75  Resp: 18  Temp: 97.9 F (36.6 C)  SpO2: 100%   Filed Weights   11/21/23 1119  Weight: 183 lb 3.2 oz (83.1 kg)    Physical Exam   (exam performed in the presence of a chaperone)  LABORATORY DATA:  I have reviewed the data as listed    Latest Ref Rng & Units 06/01/2023    9:30 AM 11/22/2022   12:03 PM 11/16/2021   10:21 AM  CMP  Glucose 70 - 99 mg/dL 89  94  881   BUN 8 - 27 mg/dL 12  14  17    Creatinine 0.57 - 1.00 mg/dL 9.22  9.10  9.22   Sodium 134 - 144 mmol/L 142  142  140   Potassium 3.5 - 5.2 mmol/L 4.0  4.2  3.6   Chloride 96 - 106 mmol/L 103  106  105  CO2 20 - 29 mmol/L 25  31  30    Calcium 8.7 - 10.3 mg/dL 9.4  9.2  9.4   Total Protein 6.5 - 8.1 g/dL  7.1  7.3   Total Bilirubin 0.3 - 1.2 mg/dL  0.6  0.5   Alkaline Phos 38 - 126 U/L  64  64   AST 15 - 41 U/L  17  19   ALT 0 - 44 U/L  16  19     Lab Results  Component Value Date   WBC 5.3 11/21/2023   HGB 13.5 11/21/2023   HCT 41.0 11/21/2023   MCV 79.9 (L) 11/21/2023   PLT 170 11/21/2023   NEUTROABS 2.2 11/21/2023    ASSESSMENT & PLAN:  Malignant neoplasm of upper-outer quadrant of left breast in female, estrogen receptor positive (HCC) February 2018: Left breast UIQ and left axillary lymph node biopsy: Grade  3 IDC T1CN1A stage IIb ER positive PR negative HER2 negative Ki-67 40%, MRI biopsy 05/25/2016: Second area of left breast cancer 05/11/2016: Started neoadjuvant anastrozole  MammaPrint: High risk luminal type B 06/09/2016-09/29/2016: Neoadjuvant dose dense Adriamycin  and Cytoxan  x4 followed by Taxol  x8 (stopped for neuropathy) 11/03/2016: Left lumpectomy: Breast showed complete response, 1/2 sentinel nodes positive 02/01/2017: Completed adjuvant radiation   Current treatment: Anastrozole  resumed 02/11/2017 This concludes 7 years of therapy by December.  She will discontinue anastrozole  at this time.   Breast cancer surveillance: 1.  Breast exam 11/21/2023: Benign 2. mammogram: 07/04/2023: Benign, breast density category B 3.  CT chest 02/25/2021: Negative for metastatic disease    Blood pressure issues: I instructed her to discuss with cardiology about adjusting her medications. Insomnia: I discussed with her about different measures that she can take to improve her sleep hygiene. Patient wants to go to [facility] but is very afraid that she might have episodes of confusion/dizziness and she is concerned about losing consciousness while she is on travel.   Return to clinic in 1 year for follow-up   Assessment & Plan History of malignant neoplasm of left breast, status post anastrozole  completion Completed seven years of anastrozole  for estrogen receptor-positive breast cancer. Recent mammogram normal. Discontinuation may improve energy levels. - Discontinue anastrozole  as therapy is complete. - Continue annual mammograms.  Hypotension with associated symptoms Reports low blood pressure with weakness and confusion, improving with hydration. Concerns about hypotension during travel. - Discuss with primary care physician the possibility of adjusting antihypertensive regimen during travel. - Maintain hydration, especially during travel.  Hypertension, managed with antihypertensive therapy Currently on  losartan  and amlodipine . Previous adjustments led to high blood pressure. Consultation advised regarding regimen, especially with travel plans. - Monitor blood pressure regularly and document readings. - Consult with primary care physician or cardiologist regarding current antihypertensive regimen.  Fatigue and insomnia Experiences fatigue and insomnia, possibly related to hypotension and medication regimen. Discontinuation of anastrozole  may improve energy levels. - Discontinue anastrozole , which may improve energy levels.  Lower extremity edema Reports intermittent edema, improving with leg elevation. Limited physical activity may contribute. - Increase physical activity gradually, starting with walking five minutes daily and increasing duration. - Elevate legs to reduce swelling.      No orders of the defined types were placed in this encounter.  The patient has a good understanding of the overall plan. she agrees with it. she will call with any problems that may develop before the next visit here. Total time spent: 30 mins including face to face time and time spent for planning,  charting and co-ordination of care   Naomi MARLA Chad, MD 11/21/23

## 2024-03-12 ENCOUNTER — Other Ambulatory Visit: Payer: Self-pay | Admitting: Hematology and Oncology

## 2024-11-26 ENCOUNTER — Other Ambulatory Visit

## 2024-11-26 ENCOUNTER — Ambulatory Visit: Admitting: Hematology and Oncology

## 2024-11-26 ENCOUNTER — Inpatient Hospital Stay

## 2024-11-26 ENCOUNTER — Inpatient Hospital Stay: Admitting: Hematology and Oncology
